# Patient Record
Sex: Female | Born: 1990 | Race: Black or African American | Hispanic: No | Marital: Single | Smoking: Never smoker
Health system: Southern US, Community
[De-identification: ages and names within clinical notes are randomized; demographics above are authoritative.]

## PROBLEM LIST (undated history)

## (undated) ENCOUNTER — Inpatient Hospital Stay (HOSPITAL_COMMUNITY): Payer: Self-pay

## (undated) DIAGNOSIS — E785 Hyperlipidemia, unspecified: Secondary | ICD-10-CM

## (undated) DIAGNOSIS — E1110 Type 2 diabetes mellitus with ketoacidosis without coma: Secondary | ICD-10-CM

## (undated) DIAGNOSIS — I10 Essential (primary) hypertension: Secondary | ICD-10-CM

## (undated) DIAGNOSIS — E119 Type 2 diabetes mellitus without complications: Secondary | ICD-10-CM

## (undated) HISTORY — DX: Essential (primary) hypertension: I10

## (undated) HISTORY — PX: WISDOM TOOTH EXTRACTION: SHX21

## (undated) HISTORY — DX: Hyperlipidemia, unspecified: E78.5

---
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
## 2008-05-30 ENCOUNTER — Ambulatory Visit: Payer: Self-pay | Admitting: Internal Medicine

## 2008-05-30 DIAGNOSIS — R5381 Other malaise: Secondary | ICD-10-CM | POA: Insufficient documentation

## 2008-05-30 DIAGNOSIS — R5383 Other fatigue: Secondary | ICD-10-CM

## 2008-05-31 LAB — CONVERTED CEMR LAB
ALT: 19 units/L (ref 0–35)
AST: 17 units/L (ref 0–37)
Albumin: 4.1 g/dL (ref 3.5–5.2)
Alkaline Phosphatase: 43 units/L (ref 39–117)
BUN: 8 mg/dL (ref 6–23)
Basophils Absolute: 0 10*3/uL (ref 0.0–0.1)
Basophils Relative: 0.2 % (ref 0.0–1.0)
Bilirubin, Direct: 0.1 mg/dL (ref 0.0–0.3)
CO2: 27 meq/L (ref 19–32)
Calcium: 9.5 mg/dL (ref 8.4–10.5)
Chloride: 105 meq/L (ref 96–112)
Creatinine, Ser: 0.8 mg/dL (ref 0.4–1.2)
Eosinophils Absolute: 0 10*3/uL (ref 0.0–0.7)
Eosinophils Relative: 1.1 % (ref 0.0–5.0)
Folate: 6.4 ng/mL
GFR calc Af Amer: 119 mL/min
GFR calc non Af Amer: 98 mL/min
Glucose, Bld: 107 mg/dL — ABNORMAL HIGH (ref 70–99)
HCT: 30.7 % — ABNORMAL LOW (ref 36.0–46.0)
Hemoglobin: 10.3 g/dL — ABNORMAL LOW (ref 12.0–15.0)
Iron: 33 ug/dL — ABNORMAL LOW (ref 42–145)
Lymphocytes Relative: 42.7 % (ref 12.0–46.0)
MCHC: 33.6 g/dL (ref 30.0–36.0)
MCV: 77.3 fL — ABNORMAL LOW (ref 78.0–100.0)
Monocytes Absolute: 0.4 10*3/uL (ref 0.1–1.0)
Monocytes Relative: 8.3 % (ref 3.0–12.0)
Neutro Abs: 2.1 10*3/uL (ref 1.4–7.7)
Neutrophils Relative %: 47.7 % (ref 43.0–77.0)
Platelets: 249 10*3/uL (ref 150–400)
Potassium: 3.5 meq/L (ref 3.5–5.1)
RBC: 3.97 M/uL (ref 3.87–5.11)
RDW: 15.2 % — ABNORMAL HIGH (ref 11.5–14.6)
Sodium: 140 meq/L (ref 135–145)
TSH: 0.62 microintl units/mL (ref 0.35–5.50)
Total Bilirubin: 0.6 mg/dL (ref 0.3–1.2)
Total Protein: 7.6 g/dL (ref 6.0–8.3)
Vitamin B-12: 305 pg/mL (ref 211–911)
WBC: 4.3 10*3/uL — ABNORMAL LOW (ref 4.5–10.5)

## 2008-08-24 ENCOUNTER — Ambulatory Visit: Payer: Self-pay | Admitting: Internal Medicine

## 2010-01-28 LAB — CONVERTED CEMR LAB: Pap Smear: NORMAL

## 2010-03-18 ENCOUNTER — Emergency Department (HOSPITAL_COMMUNITY): Admission: EM | Admit: 2010-03-18 | Discharge: 2010-03-19 | Payer: Self-pay | Admitting: Emergency Medicine

## 2010-06-05 ENCOUNTER — Emergency Department (HOSPITAL_BASED_OUTPATIENT_CLINIC_OR_DEPARTMENT_OTHER): Admission: EM | Admit: 2010-06-05 | Discharge: 2010-06-05 | Payer: Self-pay | Admitting: Emergency Medicine

## 2010-07-03 ENCOUNTER — Ambulatory Visit: Payer: Self-pay | Admitting: Internal Medicine

## 2010-07-03 LAB — CONVERTED CEMR LAB
BUN: 9 mg/dL (ref 6–23)
CO2: 23 meq/L (ref 19–32)
Calcium: 9.8 mg/dL (ref 8.4–10.5)
Chloride: 105 meq/L (ref 96–112)
Cholesterol: 153 mg/dL (ref 0–200)
Creatinine, Ser: 0.83 mg/dL (ref 0.40–1.20)
Glucose, Bld: 94 mg/dL (ref 70–99)
HCT: 36.6 % (ref 36.0–46.0)
HDL: 36 mg/dL — ABNORMAL LOW (ref >39)
Hemoglobin: 11.1 g/dL — ABNORMAL LOW (ref 12.0–15.0)
LDL Cholesterol: 106 mg/dL — ABNORMAL HIGH (ref 0–99)
MCHC: 30.3 g/dL (ref 30.0–36.0)
MCV: 86.9 fL (ref 78.0–100.0)
Platelets: 331 10*3/uL (ref 150–400)
Potassium: 4.7 meq/L (ref 3.5–5.3)
RBC: 4.21 M/uL (ref 3.87–5.11)
RDW: 14.6 % (ref 11.5–15.5)
Sodium: 137 meq/L (ref 135–145)
Total CHOL/HDL Ratio: 4.3
Triglycerides: 53 mg/dL (ref <150)
VLDL: 11 mg/dL (ref 0–40)
WBC: 4.5 10*3/uL (ref 4.0–10.5)

## 2010-07-04 ENCOUNTER — Encounter: Payer: Self-pay | Admitting: Internal Medicine

## 2010-07-04 ENCOUNTER — Telehealth: Payer: Self-pay | Admitting: Internal Medicine

## 2010-07-09 ENCOUNTER — Telehealth: Payer: Self-pay | Admitting: Internal Medicine

## 2010-07-09 ENCOUNTER — Encounter: Payer: Self-pay | Admitting: Internal Medicine

## 2010-08-06 ENCOUNTER — Ambulatory Visit: Payer: Self-pay | Admitting: Diagnostic Radiology

## 2010-08-06 ENCOUNTER — Ambulatory Visit (HOSPITAL_BASED_OUTPATIENT_CLINIC_OR_DEPARTMENT_OTHER): Admission: RE | Admit: 2010-08-06 | Discharge: 2010-08-06 | Payer: Self-pay | Admitting: Internal Medicine

## 2010-08-06 ENCOUNTER — Ambulatory Visit: Payer: Self-pay | Admitting: Internal Medicine

## 2010-08-06 DIAGNOSIS — D649 Anemia, unspecified: Secondary | ICD-10-CM | POA: Insufficient documentation

## 2010-08-06 DIAGNOSIS — R0602 Shortness of breath: Secondary | ICD-10-CM | POA: Insufficient documentation

## 2010-08-06 DIAGNOSIS — R350 Frequency of micturition: Secondary | ICD-10-CM | POA: Insufficient documentation

## 2010-08-08 ENCOUNTER — Telehealth: Payer: Self-pay | Admitting: Internal Medicine

## 2010-08-12 ENCOUNTER — Ambulatory Visit (HOSPITAL_COMMUNITY): Admission: RE | Admit: 2010-08-12 | Discharge: 2010-08-12 | Payer: Self-pay | Admitting: Internal Medicine

## 2010-08-14 ENCOUNTER — Ambulatory Visit: Payer: Self-pay | Admitting: Internal Medicine

## 2010-09-07 ENCOUNTER — Emergency Department (HOSPITAL_BASED_OUTPATIENT_CLINIC_OR_DEPARTMENT_OTHER): Admission: EM | Admit: 2010-09-07 | Discharge: 2010-09-07 | Payer: Self-pay | Admitting: Emergency Medicine

## 2010-12-15 LAB — CONVERTED CEMR LAB
Bilirubin Urine: NEGATIVE
Ferritin: 20 ng/mL (ref 10–291)
Glucose, Urine, Semiquant: NEGATIVE
Hgb S Quant: 0 % (ref 0.0–0.0)
Iron: 38 ug/dL — ABNORMAL LOW (ref 42–145)
Ketones, urine, test strip: NEGATIVE
MCHC: 30.9 g/dL (ref 30.0–36.0)
MCV: 85.7 fL (ref 78.0–100.0)
Nitrite: NEGATIVE
Platelets: 295 10*3/uL (ref 150–400)
Protein, U semiquant: 30
RBC: 3.85 M/uL — ABNORMAL LOW (ref 3.87–5.11)
Specific Gravity, Urine: 1.025
UIBC: 360 ug/dL
Urobilinogen, UA: 0.2
WBC Urine, dipstick: NEGATIVE
pH: 5

## 2010-12-17 NOTE — Assessment & Plan Note (Signed)
Summary: SOB while exercising/dt   Vital Signs:  Patient profile:   20 year old female Menstrual status:  regular Height:      65.5 inches Weight:      168 pounds O2 Sat:      100 % on Room air Pulse rate:   62 / minute BP sitting:   110 / 76  (left arm) Cuff size:   regular  Vitals Entered By: Kathlene November (August 06, 2010 8:44 AM)  O2 Flow:  Room air CC: sob when running   Primary Care Provider:  D. Thomos Lemons DO  CC:  sob when running.  History of Present Illness: 20 y/o AA female c/o dyspnea 2 weeks ago - pt was running she went over a hill.  she felt short of breath.  some wheezing it was not particularly cold no prev hx of wheezing or asthma no prev hx of exercise intolerance no chest pain with exertion no hx of syncope she has hx of anemia - presumed iron def  she also c/o mild urinary freq, urgency and dysuria  Current Medications (verified): 1)  Vitamin D3 1000 Unit  Tabs (Cholecalciferol) .Marland Kitchen.. 1 By Mouth Daily  Allergies (verified): No Known Drug Allergies  Comments:  Nurse/Medical Assistant: The patient's medications and allergies were reviewed with the patient and were updated in the Medication and Allergy Lists. Kathlene November (August 06, 2010 8:45 AM)  Past History:  Past Medical History: Anemia-iron deficiency      Family History: Family History Diabetes 1st degree relative  Family History Hypertension  Mother and father - age 37 both parents have htn DM II - mother (insulin) mother had CVA - late 47's maternal aunts - breast ca colon ca - no      Social History: Occupation: Retail banker. - pharmacy tech Single - lives with parents Never Smoked   no alcohol  Physical Exam  General:  alert, well-developed, and well-nourished.   Eyes:  pink conjunctiva Ears:  R ear normal and L ear normal.   Mouth:  pharynx pink and moist.   Lungs:  normal respiratory effort, normal breath sounds, no crackles, and no wheezes.   Heart:   normal rate, regular rhythm, no murmur, and no gallop.     Impression & Recommendations:  Problem # 1:  SHORTNESS OF BREATH (ICD-786.05) 20 y/o AA female with complaints of dyspnea.  question anemia vs ashtma. check labs.  arrange PFTs Orders: T-2 View CXR, Same Day (71020.5TC) EKG w/ Interpretation (93000) T-CBC No Diff ([id number]) Pulmonary Referral (Pulmonary)  Problem # 2:  ANEMIA-IRON DEFICIENCY (ICD-280.9)  Orders: T-Iron ([id number]) T-Iron Binding Capacity (TIBC) ([id number]) T-Ferritin ([id number])  Problem # 3:  FREQUENCY, URINARY (ICD-788.41) UA negative.  increase fluids.  Patient advised to call office if symptoms persist or worsen.  Orders: UA Dipstick w/o Micro (manual) (64403)  Complete Medication List: 1)  Vitamin D3 1000 Unit Tabs (Cholecalciferol) .Marland Kitchen.. 1 by mouth daily  Other Orders: T- * Misc. Laboratory test [phone])  Patient Instructions: 1)  Please schedule a follow-up appointment in 2 weeks. 2)  Increase fluid intake ( cranberry juice )  Laboratory Results   Urine Tests  Date/Time Received: 08/06/2010 Date/Time Reported: 08/06/2010  Routine Urinalysis   Color: red Appearance: Clear Glucose: negative   (Normal Range: Negative) Bilirubin: negative   (Normal Range: Negative) Ketone: negative   (Normal Range: Negative) Spec. Gravity: 1.025   (Normal Range: 1.003-1.035) Blood: large   (Normal Range: Negative) pH: 5.0   (Normal  Range: 5.0-8.0) Protein: 30   (Normal Range: Negative) Urobilinogen: 0.2   (Normal Range: 0-1) Nitrite: negative   (Normal Range: Negative) Leukocyte Esterace: negative   (Normal Range: Negative)    Comments: Patient is on her menstrual cycle

## 2010-12-17 NOTE — Progress Notes (Signed)
Summary: Lab Results  Phone Note Outgoing Call   Summary of Call: call pt - HIV antibody and RPR is negative I will send letter re:  other lab results pt has mild anemia.  I suggest she take MVI with iron daily Initial call taken by: D. Thomos Lemons DO,  July 04, 2010 10:26 AM  Follow-up for Phone Call        call placed to patient at [phone]. She has been  advised per Dr Artist Pais instructions. Follow-up by: Glendell Docker CMA,  July 05, 2010 12:10 PM

## 2010-12-17 NOTE — Letter (Signed)
Summary: [employer] Form/[employer]  [employer] Form[employer]   Imported By: Lanelle Bal 07/19/2010 09:00:17  _____________________________________________________________________  External Attachment:    Type:   Image     Comment:   External Document

## 2010-12-17 NOTE — Progress Notes (Signed)
Summary: Lab Work  Programme researcher, broadcasting/film/video of Call: call pt - blood work shows iron def anemia.   I advise pt take OTC iron tabs (slow Fe) once daily. blood tests negative for sickle cell.   schedule repeat CBC in 2 months Initial call taken by: D. Thomos Lemons DO,  August 08, 2010 5:36 PM  Follow-up for Phone Call        call placed to patient at [phone], she has been advised per Dr Artist Pais instructions. Lab work entered for November in [facility] per patient request Follow-up by: Glendell Docker CMA,  August 09, 2010 12:45 PM

## 2010-12-17 NOTE — Assessment & Plan Note (Signed)
Summary: NEW PT CPX WANTS HIV TEST/DT   Vital Signs:  Patient profile:   20 year old female Menstrual status:  regular LMP:     06/15/2010 Height:      65.5 inches Weight:      166.25 pounds BMI:     27.34 O2 Sat:      100 % on Room air Pulse rate:   75 / minute Pulse rhythm:   regular Resp:     16 per minute BP sitting:   106 / 70  (left arm) Cuff size:   regular  Vitals Entered By: Glendell Docker CMA (July 03, 2010 9:53 AM)  O2 Flow:  Room air CC: CPX Is Patient Diabetic? No Pain Assessment Patient in pain? no       Does patient need assistance? Functional Status Self care Ambulation Normal Comments Cpx,requesting std testing LMP (date): 06/15/2010     Menstrual Status regular Enter LMP: 06/15/2010 Last PAP Result normal   Primary Care Provider:  Dondra Spry DO  CC:  CPX.  History of Present Illness: 20 y/o AA female for routine cpx Dr. Dareen Piano - GYN last PAP March 2011 prev HPV positive  gonorrhea - tx'ed last yr 2010 chlamydia  - tx'ed this yr  not currently sexually active  she denies GU symptoms     Preventive Screening-Counseling & Management  Alcohol-Tobacco     Alcohol drinks/day: 0     Smoking Status: never  Caffeine-Diet-Exercise     Caffeine use/day: 1 beverage daily     Diet Counseling: to improve diet; diet is suboptimal     Does Patient Exercise: yes     Times/week: 3  Hep-HIV-STD-Contraception     STD Risk Counseling: to avoid increased STD risk      Sexual History:  currently monogamous.        Drug Use:  never.    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         no night sweats or fevers  Physical Exam  General:  alert, well-developed, and well-nourished.   Head:  normocephalic and atraumatic.   Eyes:  vision grossly intact, pupils equal, pupils round, and pupils reactive to light.   Ears:  R ear normal and L ear normal.   Mouth:  good dentition and pharynx pink and moist.   Neck:  No deformities, masses, or tenderness noted. Lungs:  normal respiratory effort, normal breath sounds, no crackles, and no wheezes.   Heart:  normal rate and regular rhythm.  faint SEM I/VI LSB not accentuated by left hand grip Abdomen:  soft, non-tender, normal bowel sounds, no masses, no hepatomegaly, and no splenomegaly.   Extremities:  No lower extremity edema Neurologic:  cranial nerves II-XII intact and gait normal.  Psych:  normally interactive, good eye contact, not anxious appearing, and not depressed appearing.     Impression & Recommendations:  Problem # 1:  WELL ADULT EXAM (ICD-V70.0) Reviewed adult health maintenance protocols. safe sex practices reviewed.  we discussed limitations of HIV antibody testing  Orders: T-Basic Metabolic Panel [phone]) T-Lipid Profile [phone]) T-HIV Antibody  (Reflex) [phone]) T-Syphilis Test (RPR) [phone])  Pap smear: normal (01/28/2010) Td Booster: Tdap (07/03/2010)   Flu Vax: Fluvax 3+ (08/24/2008)   TSH: 0.62 (05/30/2008)     Complete Medication List: 1)  Vitamin D3 1000 Unit Tabs (Cholecalciferol) .Marland Kitchen.. 1 by mouth daily  Other Orders: Tdap => 44yrs IM (24401) Admin 1st Vaccine (02725) T-CBC No  Diff ([id number])  Patient Instructions: 1)  Please schedule a follow-up appointment as needed.   Preventive Care Screening  Pap Smear:    Date:  01/28/2010    Results:  normal     Immunizations Administered:  Tetanus Vaccine:    Vaccine Type: Tdap    Site: left deltoid    Mfr: GlaxoSmithKline    Dose: 0.5 ml    Route: IM    Given by: Glendell Docker CMA    Exp. Date: 02/08/2012    Lot #: [id number]    VIS given: 10/05/07 version given July 03, 2010.   Prevention & Chronic Care Immunizations   Influenza vaccine: Fluvax 3+  (08/24/2008)    Tetanus booster: 07/03/2010: Tdap    Pneumococcal vaccine: Not documented  Other Screening   Pap smear: normal  (01/28/2010)   Smoking status: never  (07/03/2010)

## 2010-12-17 NOTE — Letter (Signed)
   [place] [facility] [address] [place], [place]  [address]  [place] Phone: [phone]      July 04, 2010   [facility] [address] [place], [place] [address]  RE:  LAB RESULTS  Dear  Ms. WOLTERS,  The following is an interpretation of your most recent lab tests.  Please take note of any instructions provided or changes to medications that have resulted from your lab work.  ELECTROLYTES:  Good - no changes needed  KIDNEY FUNCTION TESTS:  Good - no changes needed  LIPID PANEL:  Fair - review at your next visit Triglyceride: 53   Cholesterol: 153   LDL: 106   HDL: 36   Chol/HDL%:  4.3 Ratio   CBC:  Fair - review at your next visit          Sincerely Yours,    Dr. Thomos Lemons

## 2010-12-17 NOTE — Miscellaneous (Signed)
Summary: Orders Update pft charges  Clinical Lists Changes  Orders: Added new Service order of Carbon Monoxide diffusing w/capacity (94720) - Signed Added new Service order of Lung Volumes (94240) - Signed Added new Service order of Spirometry (Pre & Post) (94060) - Signed 

## 2010-12-17 NOTE — Progress Notes (Signed)
Summary: ROTC form  Phone Note Call from Patient Call back at [phone]   Caller: Mom Reason for Call: Talk to Nurse Summary of Call: Pls contact pt's mother when [facility] form is ready Initial call taken by: Lannette Donath,  July 09, 2010 9:59 AM  Follow-up for Phone Call        call placed to patient she was advised form faxed to phone number provided Follow-up by: Glendell Docker CMA,  July 09, 2010 12:39 PM

## 2011-02-01 LAB — WET PREP, GENITAL: Trich, Wet Prep: NONE SEEN

## 2011-02-01 LAB — URINALYSIS, ROUTINE W REFLEX MICROSCOPIC
Glucose, UA: NEGATIVE mg/dL
Nitrite: NEGATIVE
Protein, ur: NEGATIVE mg/dL
Urobilinogen, UA: 0.2 mg/dL (ref 0.0–1.0)

## 2011-02-01 LAB — PREGNANCY, URINE: Preg Test, Ur: NEGATIVE

## 2011-02-01 LAB — URINE MICROSCOPIC-ADD ON

## 2011-02-04 LAB — GC/CHLAMYDIA PROBE AMP, GENITAL
Chlamydia, DNA Probe: POSITIVE — AB
GC Probe Amp, Genital: NEGATIVE

## 2011-02-04 LAB — URINE MICROSCOPIC-ADD ON

## 2011-02-04 LAB — URINALYSIS, ROUTINE W REFLEX MICROSCOPIC
Bilirubin Urine: NEGATIVE
Glucose, UA: NEGATIVE mg/dL
Hgb urine dipstick: NEGATIVE
Ketones, ur: 40 mg/dL — AB
Nitrite: NEGATIVE
Specific Gravity, Urine: 1.02 (ref 1.005–1.030)
pH: 6 (ref 5.0–8.0)

## 2011-02-04 LAB — POCT PREGNANCY, URINE: Preg Test, Ur: NEGATIVE

## 2014-08-31 ENCOUNTER — Emergency Department (HOSPITAL_COMMUNITY)
Admission: EM | Admit: 2014-08-31 | Discharge: 2014-08-31 | Disposition: A | Discharge status: Home / Self Care | Payer: 59 | Attending: Emergency Medicine | Admitting: Emergency Medicine

## 2014-08-31 ENCOUNTER — Encounter (HOSPITAL_COMMUNITY): Payer: Self-pay | Admitting: Emergency Medicine

## 2014-08-31 DIAGNOSIS — R350 Frequency of micturition: Secondary | ICD-10-CM | POA: Diagnosis not present

## 2014-08-31 DIAGNOSIS — R109 Unspecified abdominal pain: Secondary | ICD-10-CM | POA: Diagnosis present

## 2014-08-31 DIAGNOSIS — R34 Anuria and oliguria: Secondary | ICD-10-CM | POA: Insufficient documentation

## 2014-08-31 DIAGNOSIS — Z3202 Encounter for pregnancy test, result negative: Secondary | ICD-10-CM | POA: Diagnosis not present

## 2014-08-31 LAB — COMPREHENSIVE METABOLIC PANEL
ALBUMIN: 4 g/dL (ref 3.5–5.2)
ALK PHOS: 50 U/L (ref 39–117)
ALT: 36 U/L — ABNORMAL HIGH (ref 0–35)
ANION GAP: 15 (ref 5–15)
AST: 23 U/L (ref 0–37)
BUN: 10 mg/dL (ref 6–23)
CALCIUM: 9.5 mg/dL (ref 8.4–10.5)
CO2: 22 mEq/L (ref 19–32)
Chloride: 99 mEq/L (ref 96–112)
Creatinine, Ser: 0.89 mg/dL (ref 0.50–1.10)
GFR calc Af Amer: >90 mL/min (ref >90)
GFR calc non Af Amer: 89 mL/min — ABNORMAL LOW (ref >90)
Glucose, Bld: 146 mg/dL — ABNORMAL HIGH (ref 70–99)
POTASSIUM: 4 meq/L (ref 3.7–5.3)
Sodium: 136 mEq/L — ABNORMAL LOW (ref 137–147)
TOTAL PROTEIN: 8.1 g/dL (ref 6.0–8.3)
Total Bilirubin: <0.2 mg/dL — ABNORMAL LOW (ref 0.3–1.2)

## 2014-08-31 LAB — URINALYSIS, ROUTINE W REFLEX MICROSCOPIC
BILIRUBIN URINE: NEGATIVE
Glucose, UA: NEGATIVE mg/dL
Hgb urine dipstick: NEGATIVE
Ketones, ur: NEGATIVE mg/dL
Leukocytes, UA: NEGATIVE
NITRITE: NEGATIVE
Protein, ur: NEGATIVE mg/dL
SPECIFIC GRAVITY, URINE: 1.018 (ref 1.005–1.030)
UROBILINOGEN UA: 0.2 mg/dL (ref 0.0–1.0)
pH: 5.5 (ref 5.0–8.0)

## 2014-08-31 LAB — CBC WITH DIFFERENTIAL/PLATELET
BASOS ABS: 0 10*3/uL (ref 0.0–0.1)
BASOS PCT: 0 % (ref 0–1)
Eosinophils Absolute: 0.1 10*3/uL (ref 0.0–0.7)
Eosinophils Relative: 1 % (ref 0–5)
HCT: 35.2 % — ABNORMAL LOW (ref 36.0–46.0)
Hemoglobin: 11.3 g/dL — ABNORMAL LOW (ref 12.0–15.0)
Lymphocytes Relative: 37 % (ref 12–46)
Lymphs Abs: 2.5 10*3/uL (ref 0.7–4.0)
MCH: 27.5 pg (ref 26.0–34.0)
MCHC: 32.1 g/dL (ref 30.0–36.0)
MCV: 85.6 fL (ref 78.0–100.0)
MONO ABS: 0.5 10*3/uL (ref 0.1–1.0)
Monocytes Relative: 8 % (ref 3–12)
NEUTROS ABS: 3.6 10*3/uL (ref 1.7–7.7)
NEUTROS PCT: 54 % (ref 43–77)
Platelets: 251 10*3/uL (ref 150–400)
RBC: 4.11 MIL/uL (ref 3.87–5.11)
RDW: 13.4 % (ref 11.5–15.5)
WBC: 6.7 10*3/uL (ref 4.0–10.5)

## 2014-08-31 LAB — POC URINE PREG, ED: PREG TEST UR: NEGATIVE

## 2014-08-31 MED ORDER — KETOROLAC TROMETHAMINE 30 MG/ML IJ SOLN
30.0000 mg | Freq: Once | INTRAMUSCULAR | Status: AC
  Administered 2014-08-31: 30 mg via INTRAMUSCULAR
  Filled 2014-08-31: qty 1

## 2014-08-31 MED ORDER — PHENAZOPYRIDINE HCL 95 MG PO TABS: 95.0000 mg | ORAL_TABLET | Freq: Three times a day (TID) | ORAL | Status: DC | PRN

## 2014-08-31 NOTE — ED Provider Notes (Signed)
CSN: [id number]     Arrival date & time 08/31/14  0205 History   First MD Initiated Contact with Patient 08/31/14 0232     Chief Complaint  Patient presents with  . Abdominal Pain     (Consider location/radiation/quality/duration/timing/severity/associated sxs/prior Treatment) HPI Comments: Patient is an otherwise healthy 23 year old female presenting to the emergency department for one day history of urinary frequency and urgency with decreased urine output. She states she is having some lower abdominal discomfort associated with that. No alleviating or aggravating factors. She's not tried any medications. She endorses a she's had one episode of nonbloody loose stools. She denies any fevers, chills, vomiting, dysuria, back or flank pain, vaginal bleeding or discharge. Patient is adamant she is concerned about a sexually transmitted disease. She has no history of pregnancies or abdominal surgical history. Last menstrual period was one month ago.   History reviewed. No pertinent past medical history. History reviewed. No pertinent past surgical history. No family history on file. History  Substance Use Topics  . Smoking status: Never Smoker   . Smokeless tobacco: Not on file  . Alcohol Use: Yes     Comment: weekends   OB History   Grav Para Term Preterm Abortions TAB SAB Ect Mult Living                 Review of Systems  Gastrointestinal: Positive for nausea and abdominal pain.  Genitourinary: Positive for urgency, frequency and decreased urine volume. Negative for dysuria, flank pain, vaginal bleeding, vaginal discharge and vaginal pain.  All other systems reviewed and are negative.     Allergies  Review of patient's allergies indicates no known allergies.  Home Medications   Prior to Admission medications   Medication Sig Start Date End Date Taking? Authorizing Provider  levonorgestrel (MIRENA) 20 MCG/24HR IUD 1 each by Intrauterine route once.   Yes Historical Provider,  MD  phenazopyridine (PYRIDIUM) 95 MG tablet Take 1 tablet (95 mg total) by mouth 3 (three) times daily as needed for pain. 08/31/14   Celena Lanius L Jewel Mcafee, PA-C   BP 112/59  Pulse 56  Temp(Src) 98.2 F (36.8 C) (Oral)  Resp 16  SpO2 100% Physical Exam  Nursing note and vitals reviewed. Constitutional: She is oriented to person, place, and time. She appears well-developed and well-nourished. No distress.  HENT:  Head: Normocephalic and atraumatic.  Right Ear: External ear normal.  Left Ear: External ear normal.  Nose: Nose normal.  Mouth/Throat: Oropharynx is clear and moist.  Eyes: Conjunctivae are normal.  Neck: Normal range of motion. Neck supple.  Cardiovascular: Normal rate, regular rhythm and normal heart sounds.   Pulmonary/Chest: Effort normal and breath sounds normal.  Abdominal: Soft. Normal appearance and bowel sounds are normal. She exhibits no distension. There is tenderness (mild lower abdominal discomfort). There is no rigidity, no rebound, no guarding and no CVA tenderness.  Genitourinary:  Pelvic deferred.   Musculoskeletal: Normal range of motion.  Neurological: She is alert and oriented to person, place, and time.  Skin: Skin is warm and dry. She is not diaphoretic.  Psychiatric: She has a normal mood and affect.    ED Course  Procedures (including critical care time) Medications  ketorolac (TORADOL) 30 MG/ML injection 30 mg (30 mg Intramuscular Given 08/31/14 0327)    Labs Review Labs Reviewed  CBC WITH DIFFERENTIAL - Abnormal; Notable for the following:    Hemoglobin 11.3 (*)    HCT 35.2 (*)    All other components within  normal limits  COMPREHENSIVE METABOLIC PANEL - Abnormal; Notable for the following:    Sodium 136 (*)    Glucose, Bld 146 (*)    ALT 36 (*)    Total Bilirubin <0.2 (*)    GFR calc non Af Amer 89 (*)    All other components within normal limits  URINE CULTURE  URINALYSIS, ROUTINE W REFLEX MICROSCOPIC  POC URINE PREG, ED  POC  URINE PREG, ED    Imaging Review No results found.   EKG Interpretation None      MDM   Final diagnoses:  Urinary frequency    Filed Vitals:   08/31/14 0445  BP: 112/59  Pulse: 56  Temp:   Resp:    Afebrile, NAD, non-toxic appearing, AAOx4.   I have reviewed nursing notes, vital signs, and all appropriate lab and imaging results for this patient. On reevaluation of patient, her abdominal exam is benign. There is no peritoneal signs. Patient has no CVA tenderness. Mouth revealed without acute abnormality. UA without evidence of infection. Urine culture sent. Patient declined pelvic exam, discussed with patient that this could possibly be causing her symptoms. She is willing to follow up with her PCP for any continued symptoms. Return precautions discussed. Patient is agreeable to plan. Patient is stable at time of discharge.  Jeannetta EllisJennifer L Colon Rueth, PA-C 08/31/14 2026

## 2014-08-31 NOTE — Discharge Instructions (Signed)
Please follow up with your primary care physician in 1-2 days. If you do not have one please call the [facility] number listed above. Please read all discharge instructions and return precautions.   Urinary Frequency The number of times a normal person urinates depends upon how much liquid they take in and how much liquid they are losing. If the temperature is hot and there is high humidity, then the person will sweat more and usually breathe a little more frequently. These factors decrease the amount of frequency of urination that would be considered normal. The amount you drink is easily determined, but the amount of fluid lost is sometimes more difficult to calculate.  Fluid is lost in two ways:  Sensible fluid loss is usually measured by the amount of urine that you get rid of. Losses of fluid can also occur with diarrhea.  Insensible fluid loss is more difficult to measure. It is caused by evaporation. Insensible loss of fluid occurs through breathing and sweating. It usually ranges from a little less than a quart to a little more than a quart of fluid a day. In normal temperatures and activity levels, the average person may urinate 4 to 7 times in a 24-hour period. Needing to urinate more often than that could indicate a problem. If one urinates 4 to 7 times in 24 hours and has large volumes each time, that could indicate a different problem from one who urinates 4 to 7 times a day and has small volumes. The time of urinating is also important. Most urinating should be done during the waking hours. Getting up at night to urinate frequently can indicate some problems. CAUSES  The bladder is the organ in your lower abdomen that holds urine. Like a balloon, it swells some as it fills up. Your nerves sense this and tell you it is time to head for the bathroom. There are a number of reasons that you might feel the need to urinate more often than usual. They include:  Urinary tract  infection. This is usually associated with other signs such as burning when you urinate.  In men, problems with the prostate (a walnut-size gland that is located near the tube that carries urine out of your body). There are two reasons why the prostate can cause an increased frequency of urination:  An enlarged prostate that does not let the bladder empty well. If the bladder only half empties when you urinate, then it only has half the capacity to fill before you have to urinate again.  The nerves in the bladder become more hypersensitive with an increased size of the prostate even if the bladder empties completely.  Pregnancy.  Obesity. Excess weight is more likely to cause a problem for women than for men.  Bladder stones or other bladder problems.  Caffeine.  Alcohol.  Medications. For example, drugs that help the body get rid of extra fluid (diuretics) increase urine production. Some other medicines must be taken with lots of fluids.  Muscle or nerve weakness. This might be the result of a spinal cord injury, a stroke, multiple sclerosis, or Parkinson disease.  Long-standing diabetes can decrease the sensation of the bladder. This loss of sensation makes it harder to sense the bladder needs to be emptied. Over a period of years, the bladder is stretched out by constant overfilling. This weakens the bladder muscles so that the bladder does not empty well and has less capacity to fill with new urine.  Interstitial cystitis (also called painful bladder syndrome). This condition develops because the tissues that line the inside of the bladder are inflamed (inflammation is the body's way of reacting to injury or infection). It causes pain and frequent urination. It occurs in women more often than in men. DIAGNOSIS   To decide what might be causing your urinary frequency, your health care provider will probably:  Ask about symptoms you have noticed.  Ask about your overall health. This  will include questions about any medications you are taking.  Do a physical examination.  Order some tests. These might include:  A blood test to check for diabetes or other health issues that could be contributing to the problem.  Urine testing. This could measure the flow of urine and the pressure on the bladder.  A test of your neurological system (the brain, spinal cord, and nerves). This is the system that senses the need to urinate.  A bladder test to check whether it is emptying completely when you urinate.  Cystoscopy. This test uses a thin tube with a tiny camera on it. It offers a look inside your urethra and bladder to see if there are problems.  Imaging tests. You might be given a contrast dye and then asked to urinate. X-rays are taken to see how your bladder is working. TREATMENT  It is important for you to be evaluated to determine if the amount or frequency that you have is unusual or abnormal. If it is found to be abnormal, the cause should be determined and this can usually be found out easily. Depending upon the cause, treatment could include medication, stimulation of the nerves, or surgery. There are not too many things that you can do as an individual to change your urinary frequency. It is important that you balance the amount of fluid intake needed to compensate for your activity and the temperature. Medical problems will be diagnosed and taken care of by your physician. There is no particular bladder training such as Kegel exercises that you can do to help urinary frequency. This is an exercise that is usually recommended for people who have leaking of urine when they laugh, cough, or sneeze. HOME CARE INSTRUCTIONS   Take any medications your health care provider prescribed or suggested. Follow the directions carefully.  Practice any lifestyle changes that are recommended. These might include:  Drinking less fluid or drinking at different times of the day. If you  need to urinate often during the night, for example, you may need to stop drinking fluids early in the evening.  Cutting down on caffeine or alcohol. They both can make you need to urinate more often than normal. Caffeine is found in coffee, tea, and sodas.  Losing weight, if that is recommended.  Keep a journal or a log. You might be asked to record how much you drink and when and where you feel the need to urinate. This will also help evaluate how well the treatment provided by your physician is working. SEEK MEDICAL CARE IF:   Your need to urinate often gets worse.  You feel increased pain or irritation when you urinate.  You notice blood in your urine.  You have questions about any medications that your health care provider recommended.  You notice blood, pus, or swelling at the site of any test or treatment procedure.  You develop a fever of more than 100.51F (38.1C). SEEK IMMEDIATE MEDICAL CARE IF:  You develop a fever of more than 102.6F (38.9C).  Document Released: 08/30/2009 Document Revised: 03/20/2014 Document Reviewed: 08/30/2009 [facility] Patient Information 2015 [place], [place]. This information is not intended to replace advice given to you by your health care provider. Make sure you discuss any questions you have with your health care provider.

## 2014-08-31 NOTE — ED Notes (Signed)
Pt. States that around 0800 she started having abdominal pain. She stated she noticed she was trying to pee too many times throughout the day and not much coming out. Denies vomiting, fever or chills. States she has had some nausea.

## 2014-08-31 NOTE — ED Notes (Signed)
Family at bedside. 

## 2014-09-01 LAB — URINE CULTURE
COLONY COUNT: NO GROWTH
CULTURE: NO GROWTH

## 2014-09-01 NOTE — ED Provider Notes (Signed)
Medical screening examination/treatment/procedure(s) were performed by non-physician practitioner and as supervising physician I was immediately available for consultation/collaboration.   EKG Interpretation None        Tomasita CrumbleAdeleke Ellary Casamento, MD 09/01/14 1009

## 2014-12-16 ENCOUNTER — Encounter (HOSPITAL_COMMUNITY): Payer: Self-pay | Admitting: Emergency Medicine

## 2014-12-16 ENCOUNTER — Emergency Department (HOSPITAL_COMMUNITY)
Admission: EM | Admit: 2014-12-16 | Discharge: 2014-12-16 | Disposition: A | Discharge status: Home / Self Care | Payer: 59 | Attending: Emergency Medicine | Admitting: Emergency Medicine

## 2014-12-16 DIAGNOSIS — M545 Low back pain: Secondary | ICD-10-CM | POA: Insufficient documentation

## 2014-12-16 DIAGNOSIS — M546 Pain in thoracic spine: Secondary | ICD-10-CM | POA: Insufficient documentation

## 2014-12-16 MED ORDER — IBUPROFEN 400 MG PO TABS
800.0000 mg | ORAL_TABLET | Freq: Once | ORAL | Status: AC
  Administered 2014-12-16: 800 mg via ORAL
  Filled 2014-12-16: qty 2

## 2014-12-16 MED ORDER — HYDROCODONE-ACETAMINOPHEN 5-325 MG PO TABS
1.0000 | ORAL_TABLET | Freq: Once | ORAL | Status: AC
  Administered 2014-12-16: 1 via ORAL
  Filled 2014-12-16: qty 1

## 2014-12-16 MED ORDER — IBUPROFEN 800 MG PO TABS: 800.0000 mg | ORAL_TABLET | Freq: Three times a day (TID) | ORAL | Status: DC

## 2014-12-16 MED ORDER — METHOCARBAMOL 500 MG PO TABS: 1000.0000 mg | ORAL_TABLET | Freq: Four times a day (QID) | ORAL | Status: DC

## 2014-12-16 NOTE — ED Notes (Signed)
Declined W/C at D/C and was escorted to lobby by RN. 

## 2014-12-16 NOTE — ED Notes (Signed)
Pt. Stated, I started having some back pain and it goes around to the side.

## 2014-12-16 NOTE — Discharge Instructions (Signed)
Please read and follow all provided instructions.  Your diagnoses today include:  1. Bilateral thoracic back pain    Tests performed today include:  Vital signs - see below for your results today  Medications prescribed:   Ibuprofen (Motrin, Advil) - anti-inflammatory pain medication  Do not exceed 600mg  ibuprofen every 6 hours, take with food  You have been prescribed an anti-inflammatory medication or NSAID. Take with food. Take smallest effective dose for the shortest duration needed for your pain. Stop taking if you experience stomach pain or vomiting.    Robaxin (methocarbamol) - muscle relaxer medication  DO NOT drive or perform any activities that require you to be awake and alert because this medicine can make you drowsy.   Take any prescribed medications only as directed.  Home care instructions:   Follow any educational materials contained in this packet  Please rest, use ice or heat on your back for the next several days  Do not lift, push, pull anything more than 10 pounds for the next week  Follow-up instructions: Please follow-up with your primary care provider in the next 1 week for further evaluation of your symptoms.   Return instructions:  SEEK IMMEDIATE MEDICAL ATTENTION IF YOU HAVE:  New numbness, tingling, weakness, or problem with the use of your arms or legs  Severe back pain not relieved with medications  Loss control of your bowels or bladder  Increasing pain in any areas of the body (such as chest or abdominal pain)  Shortness of breath, dizziness, or fainting.   Worsening nausea (feeling sick to your stomach), vomiting, fever, or sweats  Any other emergent concerns regarding your health   Additional Information:  Your vital signs today were: BP 144/83 mmHg   Pulse 66   Temp(Src) 98.6 F (37 C) (Oral)   Ht 5\' 6"  (1.676 m)   Wt 220 lb 9 oz (100.046 kg)   BMI 35.62 kg/m2   SpO2 100%   LMP 10/16/2014 (Approximate) If your blood pressure  (BP) was elevated above 135/85 this visit, please have this repeated by your doctor within one month. --------------

## 2014-12-16 NOTE — ED Provider Notes (Signed)
CSN: [id number]     Arrival date & time 12/16/14  0813 History   First MD Initiated Contact with Patient 12/16/14 [phone]     Chief Complaint  Patient presents with  . Back Pain     (Consider location/radiation/quality/duration/timing/severity/associated sxs/prior Treatment) HPI Comments: Patient presents with complaint of bilateral middle back pain starting last evening and gradually worsening. This was not associated with acute injury, however patient does do some bending over and movement of light objects at work. Pain is described as a tightness. It is worse with movement of her arms, deep breathing, twisting of her torso, bending over. It is hard for her to take a deep breath but she does not feel short of breath and has not been gasping for air. She does not get short of breath with walking or other exertion. She has not had cough or hemoptysis. Pain does not radiate. She has not had vaginal bleeding or discharge. No history of kidney stones. No dysuria or hematuria. Patient denies risk factors for pulmonary embolism including: unilateral leg swelling, history of DVT/PE/other blood clots, recent immobilizations, recent surgery, recent travel (>4hr segment), malignancy, hemoptysis. Pt uses a Mirena IUD for birth control. No treatments prior to arrival. Patient denies warning symptoms of back pain including: fecal incontinence, urinary retention or overflow incontinence, night sweats, waking from sleep with back pain, unexplained fevers or weight loss, h/o cancer, IVDU, recent trauma.           Patient is a 24 y.o. female presenting with back pain. The history is provided by the patient.  Back Pain Associated symptoms: no dysuria, no fever, no numbness, no pelvic pain and no weakness     History reviewed. No pertinent past medical history. History reviewed. No pertinent past surgical history. No family history on file. History  Substance Use Topics  . Smoking status: Never Smoker   .  Smokeless tobacco: Not on file  . Alcohol Use: Yes     Comment: weekends   OB History    No data available     Review of Systems  Constitutional: Negative for fever and unexpected weight change.  Respiratory: Negative for shortness of breath.   Gastrointestinal: Negative for constipation.       Negative for fecal incontinence.   Genitourinary: Negative for dysuria, hematuria, flank pain, vaginal bleeding, vaginal discharge and pelvic pain.       Negative for urinary incontinence or retention.  Musculoskeletal: Positive for back pain.  Neurological: Negative for weakness and numbness.       Denies saddle paresthesias.    Allergies  Review of patient's allergies indicates no known allergies.  Home Medications   Prior to Admission medications   Medication Sig Start Date End Date Taking? Authorizing Provider  levonorgestrel (MIRENA) 20 MCG/24HR IUD 1 each by Intrauterine route once.    Historical Provider, MD  phenazopyridine (PYRIDIUM) 95 MG tablet Take 1 tablet (95 mg total) by mouth 3 (three) times daily as needed for pain. 08/31/14   Jennifer L Piepenbrink, PA-C   BP 144/83 mmHg  Pulse 66  Temp(Src) 98.6 F (37 C) (Oral)  Resp 16  Ht  (1.676 m)  Wt 220 lb 9 oz (100.046 kg)  BMI 35.62 kg/m2  SpO2 100%  LMP 10/16/2014 (Approximate)   Physical Exam  Constitutional: She appears well-developed and well-nourished.  HENT:  Head: Normocephalic and atraumatic.  Eyes: Conjunctivae are normal.  Neck: Normal range of motion. Neck supple.  Cardiovascular: Normal rate and regular  rhythm.   No murmur heard. Pulmonary/Chest: Effort normal and breath sounds normal. No respiratory distress. She has no wheezes. She has no rales.  Abdominal: Soft. There is no tenderness. There is no rebound, no guarding and no CVA tenderness.  Musculoskeletal: She exhibits no edema or tenderness.       Right shoulder: Normal.       Left shoulder: Normal.       Cervical back: Normal.        Thoracic back: She exhibits decreased range of motion and pain. She exhibits no tenderness, no bony tenderness, no swelling and no spasm.       Lumbar back: She exhibits decreased range of motion. She exhibits no tenderness and no bony tenderness.       Back:  No step-off noted with palpation of spine.   Neurological: She is alert. She has normal strength and normal reflexes. No sensory deficit.  5/5 strength in entire lower extremities bilaterally. No sensation deficit.   Skin: Skin is warm and dry. No rash noted.  Psychiatric: She has a normal mood and affect.  Nursing note and vitals reviewed.   ED Course  Procedures (including critical care time) Labs Review Labs Reviewed - No data to display  Imaging Review No results found.   EKG Interpretation None      8:46 AM Patient seen and examined. Medications ordered.   Vital signs reviewed and are as follows: BP 144/83 mmHg  Pulse 66  Temp(Src) 98.6 F (37 C) (Oral)  Resp 16  Ht 5\' 6"  (1.676 m)  Wt 220 lb 9 oz (100.046 kg)  BMI 35.62 kg/m2  SpO2 100%  LMP 10/16/2014 (Approximate)  No red flag s/s of low back pain. Patient was counseled on back pain precautions and told to do activity as tolerated but do not lift, push, or pull heavy objects more than 10 pounds for the next week.  Patient counseled to use ice or heat on back for no longer than 15 minutes every hour.   Patient prescribed muscle relaxer and counseled on proper use of muscle relaxant medication.    Urged patient not to drink alcohol, drive, or perform any other activities that requires focus while taking either of these medications.  Patient urged to follow-up with PCP if pain does not improve with treatment and rest or if pain becomes recurrent. Urged to return with worsening severe pain, loss of bowel or bladder control, trouble walking.   The patient verbalizes understanding and agrees with the plan.   MDM   Final diagnoses:  Bilateral thoracic back  pain   Patient with thoracic back pain. This seems musculoskeletal in nature as it is exacerbated by deep breathing, rotation of her torso, and bending over. I do not suspect PE. Patient has no lower abdominal tenderness or flank tenderness suggestive of kidney stone or other intra-abdominal etiology. No neurological deficits. Patient is ambulatory. No warning symptoms of back pain including: fecal incontinence, urinary retention or overflow incontinence, night sweats, waking from sleep with back pain, unexplained fevers or weight loss, h/o cancer, IVDU, recent trauma. No concern for cauda equina, epidural abscess, or other serious cause of back pain. Conservative measures such as rest, ice/heat and pain medicine indicated with PCP follow-up if no improvement with conservative management.     Renne CriglerJoshua Decarlos Empey, PA-C 12/16/14 [id number]  Tilden FossaElizabeth Rees, MD 12/16/14 1006

## 2015-09-28 ENCOUNTER — Emergency Department
Admission: EM | Admit: 2015-09-28 | Discharge: 2015-09-28 | Disposition: A | Discharge status: Home / Self Care | Payer: 59 | Attending: Emergency Medicine | Admitting: Emergency Medicine

## 2015-09-28 ENCOUNTER — Encounter: Payer: Self-pay | Admitting: Emergency Medicine

## 2015-09-28 DIAGNOSIS — E1165 Type 2 diabetes mellitus with hyperglycemia: Secondary | ICD-10-CM | POA: Insufficient documentation

## 2015-09-28 DIAGNOSIS — Z131 Encounter for screening for diabetes mellitus: Secondary | ICD-10-CM

## 2015-09-28 DIAGNOSIS — Z3202 Encounter for pregnancy test, result negative: Secondary | ICD-10-CM | POA: Insufficient documentation

## 2015-09-28 DIAGNOSIS — Z79899 Other long term (current) drug therapy: Secondary | ICD-10-CM | POA: Insufficient documentation

## 2015-09-28 LAB — CBC WITH DIFFERENTIAL/PLATELET
BASOS PCT: 1 %
Basophils Absolute: 0 10*3/uL (ref 0–0.1)
EOS ABS: 0.1 10*3/uL (ref 0–0.7)
Eosinophils Relative: 2 %
HEMATOCRIT: 37.3 % (ref 35.0–47.0)
HEMOGLOBIN: 12.7 g/dL (ref 12.0–16.0)
LYMPHS ABS: 2.4 10*3/uL (ref 1.0–3.6)
Lymphocytes Relative: 39 %
MCH: 28.2 pg (ref 26.0–34.0)
MCHC: 34.1 g/dL (ref 32.0–36.0)
MCV: 82.7 fL (ref 80.0–100.0)
Monocytes Absolute: 0.6 10*3/uL (ref 0.2–0.9)
Monocytes Relative: 10 %
NEUTROS ABS: 2.9 10*3/uL (ref 1.4–6.5)
NEUTROS PCT: 48 %
Platelets: 281 10*3/uL (ref 150–440)
RBC: 4.51 MIL/uL (ref 3.80–5.20)
RDW: 14.5 % (ref 11.5–14.5)
WBC: 6 10*3/uL (ref 3.6–11.0)

## 2015-09-28 LAB — URINALYSIS COMPLETE WITH MICROSCOPIC (ARMC ONLY)
Bilirubin Urine: NEGATIVE
Glucose, UA: >500 mg/dL — AB
HGB URINE DIPSTICK: NEGATIVE
LEUKOCYTES UA: NEGATIVE
NITRITE: NEGATIVE
PH: 5 (ref 5.0–8.0)
PROTEIN: NEGATIVE mg/dL
SPECIFIC GRAVITY, URINE: 1.027 (ref 1.005–1.030)

## 2015-09-28 LAB — GLUCOSE, CAPILLARY
Glucose-Capillary: 328 mg/dL — ABNORMAL HIGH (ref 65–99)
Glucose-Capillary: 298 mg/dL — ABNORMAL HIGH (ref 65–99)

## 2015-09-28 LAB — BASIC METABOLIC PANEL
Anion gap: 10 (ref 5–15)
BUN: 11 mg/dL (ref 6–20)
CHLORIDE: 101 mmol/L (ref 101–111)
CO2: 22 mmol/L (ref 22–32)
Calcium: 9.5 mg/dL (ref 8.9–10.3)
Creatinine, Ser: 0.75 mg/dL (ref 0.44–1.00)
GFR calc non Af Amer: >60 mL/min (ref >60)
Glucose, Bld: 356 mg/dL — ABNORMAL HIGH (ref 65–99)
POTASSIUM: 3.9 mmol/L (ref 3.5–5.1)
SODIUM: 133 mmol/L — AB (ref 135–145)

## 2015-09-28 LAB — POCT PREGNANCY, URINE: Preg Test, Ur: NEGATIVE

## 2015-09-28 MED ORDER — SODIUM CHLORIDE 0.9 % IV BOLUS (SEPSIS)
1000.0000 mL | Freq: Once | INTRAVENOUS | Status: AC
  Administered 2015-09-28: 1000 mL via INTRAVENOUS

## 2015-09-28 NOTE — ED Notes (Signed)
Patient was diagnosed as a diabetic yesterday after a 1 week history of nausea.  Blood sugar was 371 and this morning sugar was 498.  Started on Metformin BID, patient has taken three doses of medicine.  Patient checked blood sugar 1 hour ago, 350.  Metformin last taken 1 hour ago.  Patient c/o nausea.

## 2015-09-28 NOTE — ED Notes (Signed)
CBG 328 

## 2015-09-28 NOTE — ED Provider Notes (Addendum)
[facility] Emergency Department Provider Note  ____________________________________________   I have reviewed the triage vital signs and the nursing notes.   HISTORY  Chief Complaint Hyperglycemia    HPI Angela Henderson is a 24 y.o. female the family history diabetes who also unfortunate suffers from morbid obesity, presents today complaining of elevated blood sugar. Patient was actually diagnosed as diabetic yesterday and started on metformin she has had 3 total metformin pills. Patient states she is working to start modifying her diet although she has been eating well today. She states that she can tell her sugar is up she feels slightly nauseated. She has had no abdominal pain vomiting fever or chills or other concerns. She did check her sugar today was running high and she noted she was mildly nauseated so she came back in. She does have a primary care doctor but they're close. She denies any signs or symptoms of infection such as fever. She denies pregnancy.  Past Medical History  Diagnosis Date  . Diabetes mellitus without complication [facility](HCC)     Patient Active Problem List   Diagnosis Date Noted  . ANEMIA 08/06/2010  . SHORTNESS OF BREATH 08/06/2010  . FREQUENCY, URINARY 08/06/2010  . FATIGUE 05/30/2008  . 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 No pertinent past surgical history.  Current Outpatient Rx  Name  Route  Sig  Dispense  Refill  . ibuprofen (ADVIL,MOTRIN) 800 MG tablet   Oral   Take 1 tablet (800 mg total) by mouth 3 (three) times daily.   21 tablet   0   . levonorgestrel (MIRENA) 20 MCG/24HR IUD   Intrauterine   1 each by Intrauterine route once.         . methocarbamol (ROBAXIN) 500 MG tablet   Oral   Take 2 tablets (1,000 mg total) by mouth 4 (four) times daily.   30 tablet   0   . phenazopyridine (PYRIDIUM) 95 MG tablet   Oral   Take 1 tablet (95 mg total) by mouth 3 (three) times daily as needed for  pain.   10 tablet   0     Allergies Review of patient's allergies indicates no known allergies.  No family history on file.  Social History Social History  Substance Use Topics  . Smoking status: Never Smoker   . Smokeless tobacco: None  . Alcohol Use: Yes     Comment: weekends    Review of Systems Constitutional: No fever/chills Eyes: No visual changes. ENT: No sore throat. No stiff neck no neck pain Cardiovascular: Denies chest pain. Respiratory: Denies shortness of breath. Gastrointestinal:   no vomiting.  No diarrhea.  No constipation. Genitourinary: Negative for dysuria. Musculoskeletal: Negative lower extremity swelling Skin: Negative for rash. Neurological: Negative for headaches, focal weakness or numbness. 10-point ROS otherwise negative.  ____________________________________________   PHYSICAL EXAM:  VITAL SIGNS: ED Triage Vitals  Enc Vitals Group     BP 09/28/15 1839 146/86 mmHg     Pulse Rate 09/28/15 1839 91     Resp 09/28/15 1839 16     Temp 09/28/15 1839 98.7 F (37.1 C)     Temp Source 09/28/15 1839 Oral     SpO2 09/28/15 1839 97 %     Weight 09/28/15 1839 211 lb (95.709 kg)     Height 09/28/15 1839 5\' 6"  (1.676 m)     Head Cir --      Peak Flow --      Pain  Score 09/28/15 1841 0     Pain Loc --      Pain Edu? --      Excl. in GC? --     Constitutional: Alert and oriented. Well appearing and in no acute distress. Eyes: Conjunctivae are normal. PERRL. EOMI. Head: Atraumatic. Nose: No congestion/rhinnorhea. Mouth/Throat: Mucous membranes are moist.  Oropharynx non-erythematous. Neck: No stridor.   Nontender with no meningismus Cardiovascular: Normal rate, regular rhythm. Grossly normal heart sounds.  Good peripheral circulation. Respiratory: Normal respiratory effort.  No retractions. Lungs CTAB. Abdominal: Soft and nontender. No distention. No guarding no rebound Back:  There is no focal tenderness or step off there is no midline  tenderness there are no lesions noted. there is no CVA tenderness Musculoskeletal: No lower extremity tenderness. No joint effusions, no DVT signs strong distal pulses no edema Neurologic:  Normal speech and language. No gross focal neurologic deficits are appreciated.  Skin:  Skin is warm, dry and intact. No rash noted. Psychiatric: Mood and affect are normal. Speech and behavior are normal.  ____________________________________________   LABS (all labs ordered are listed, but only abnormal results are displayed)  Labs Reviewed  BASIC METABOLIC PANEL - Abnormal; Notable for the following:    Sodium 133 (*)    Glucose, Bld 356 (*)    All other components within normal limits  URINALYSIS COMPLETEWITH MICROSCOPIC ([facility] ONLY) - Abnormal; Notable for the following:    Color, Urine YELLOW (*)    APPearance CLEAR (*)    Glucose, UA >500 (*)    Ketones, ur 1+ (*)    Bacteria, UA RARE (*)    Squamous Epithelial / LPF 0-5 (*)    All other components within normal limits  GLUCOSE, CAPILLARY - Abnormal; Notable for the following:    Glucose-Capillary 328 (*)    All other components within normal limits  CBC WITH DIFFERENTIAL/PLATELET  POC URINE PREG, ED  POCT PREGNANCY, URINE   ____________________________________________  EKG  I personally interpreted any EKGs ordered by me or triage  ____________________________________________  RADIOLOGY  I reviewed any imaging ordered by me or triage that were performed during my shift ____________________________________________   PROCEDURES  Procedure(s) performed: None  Critical Care performed: None  ____________________________________________   INITIAL IMPRESSION / ASSESSMENT AND PLAN / ED COURSE  Pertinent labs & imaging results that were available during my care of the patient were reviewed by me and considered in my medical decision making (see chart for details).  Patient with elevated blood sugar likely some degree of  dehydration given that she has had high sugars for last several weeks before diagnoses. We'll give her IV fluid and reassessed. She has no elevated white count no fever no evidence of UTI she is not pregnant, she has no anion gap, there is nothing to suggest DKA. We will give her IV fluid, 2 L, and reassessed. Her sugar is already coming down.   ----------------------------------------- 9:37 PM on 09/28/2015 -----------------------------------------  Patient feels 100% better after IV fluid. Obviously, our goal was not to cure her diabetes or make her sugar perfect, it is to make sure she is not too dehydrated and allow her physicians to continue to manage her diabetes. I've advised her to take an extra metformin this evening she takes apparently 500 twice a day and to continue to watch her diet. She will drink plenty of fluids and return to the emergency room she feels worse in any way. There is no evidence of DKA or significant  palpitation from this and she has serial negative abdominal exams with no evidence of acute pathology aside from it is likely chronically elevated blood glucose. ____________________________________________   FINAL CLINICAL IMPRESSION(S) / ED DIAGNOSES  Final diagnoses:  None     Jeanmarie Plant, MD 09/28/15 1951  Jeanmarie Plant, MD 09/28/15 2138

## 2015-09-28 NOTE — Discharge Instructions (Signed)
We advised that you continue to watch her diet drink plenty of fluids he may take an extra metformin tonight before you go to bed feeling to sugars dropping too quickly, keep juice nearby and be with family tonight. Usually however metformin does not have the side effect of rapidly dropping sugar, fortunately. Follow closely with primary care doctor for further management of your blood sugars. If her sugars are reading consistently over 500 or you feel otherwise ill including vomiting or weakness or abdominal pain or worse in any other way please return to the emergency department.

## 2015-10-17 ENCOUNTER — Encounter (HOSPITAL_COMMUNITY): Payer: Self-pay | Admitting: Neurology

## 2015-10-17 ENCOUNTER — Emergency Department (HOSPITAL_COMMUNITY)
Admission: EM | Admit: 2015-10-17 | Discharge: 2015-10-17 | Disposition: A | Discharge status: Home / Self Care | Payer: 59 | Attending: Emergency Medicine | Admitting: Emergency Medicine

## 2015-10-17 DIAGNOSIS — R739 Hyperglycemia, unspecified: Secondary | ICD-10-CM

## 2015-10-17 DIAGNOSIS — Z79899 Other long term (current) drug therapy: Secondary | ICD-10-CM | POA: Insufficient documentation

## 2015-10-17 DIAGNOSIS — R112 Nausea with vomiting, unspecified: Secondary | ICD-10-CM | POA: Insufficient documentation

## 2015-10-17 DIAGNOSIS — E1165 Type 2 diabetes mellitus with hyperglycemia: Secondary | ICD-10-CM | POA: Insufficient documentation

## 2015-10-17 DIAGNOSIS — N898 Other specified noninflammatory disorders of vagina: Secondary | ICD-10-CM | POA: Insufficient documentation

## 2015-10-17 LAB — CBC
HEMATOCRIT: 38.1 % (ref 36.0–46.0)
Hemoglobin: 12.4 g/dL (ref 12.0–15.0)
MCH: 26.8 pg (ref 26.0–34.0)
MCHC: 32.5 g/dL (ref 30.0–36.0)
MCV: 82.3 fL (ref 78.0–100.0)
PLATELETS: 290 10*3/uL (ref 150–400)
RBC: 4.63 MIL/uL (ref 3.87–5.11)
RDW: 13.3 % (ref 11.5–15.5)
WBC: 5.3 10*3/uL (ref 4.0–10.5)

## 2015-10-17 LAB — BASIC METABOLIC PANEL
Anion gap: 13 (ref 5–15)
BUN: 9 mg/dL (ref 6–20)
CHLORIDE: 96 mmol/L — AB (ref 101–111)
CO2: 21 mmol/L — ABNORMAL LOW (ref 22–32)
Calcium: 9.6 mg/dL (ref 8.9–10.3)
Creatinine, Ser: 1.18 mg/dL — ABNORMAL HIGH (ref 0.44–1.00)
Glucose, Bld: 487 mg/dL — ABNORMAL HIGH (ref 65–99)
POTASSIUM: 3.9 mmol/L (ref 3.5–5.1)
SODIUM: 130 mmol/L — AB (ref 135–145)

## 2015-10-17 LAB — URINALYSIS, ROUTINE W REFLEX MICROSCOPIC
Bilirubin Urine: NEGATIVE
KETONES UR: 40 mg/dL — AB
LEUKOCYTES UA: NEGATIVE
NITRITE: NEGATIVE
PROTEIN: NEGATIVE mg/dL
Specific Gravity, Urine: 1.039 — ABNORMAL HIGH (ref 1.005–1.030)
pH: 5 (ref 5.0–8.0)

## 2015-10-17 LAB — CBG MONITORING, ED
GLUCOSE-CAPILLARY: 375 mg/dL — AB (ref 65–99)
GLUCOSE-CAPILLARY: 296 mg/dL — AB (ref 65–99)
Glucose-Capillary: 444 mg/dL — ABNORMAL HIGH (ref 65–99)

## 2015-10-17 LAB — I-STAT BETA HCG BLOOD, ED (MC, WL, AP ONLY)

## 2015-10-17 LAB — WET PREP, GENITAL
CLUE CELLS WET PREP: NONE SEEN
SPERM: NONE SEEN
Trich, Wet Prep: NONE SEEN
Yeast Wet Prep HPF POC: NONE SEEN

## 2015-10-17 LAB — URINE MICROSCOPIC-ADD ON

## 2015-10-17 MED ORDER — SODIUM CHLORIDE 0.9 % IV BOLUS (SEPSIS)
1000.0000 mL | Freq: Once | INTRAVENOUS | Status: AC
  Administered 2015-10-17: 1000 mL via INTRAVENOUS

## 2015-10-17 MED ORDER — INSULIN ASPART 100 UNIT/ML ~~LOC~~ SOLN
8.0000 [IU] | Freq: Once | SUBCUTANEOUS | Status: AC
  Administered 2015-10-17: 8 [IU] via INTRAVENOUS
  Filled 2015-10-17: qty 1

## 2015-10-17 MED ORDER — ONDANSETRON 4 MG PO TBDP: 4.0000 mg | ORAL_TABLET | Freq: Three times a day (TID) | ORAL | Status: DC | PRN

## 2015-10-17 MED ORDER — ONDANSETRON HCL 4 MG/2ML IJ SOLN
4.0000 mg | Freq: Once | INTRAMUSCULAR | Status: AC
  Administered 2015-10-17: 4 mg via INTRAVENOUS
  Filled 2015-10-17: qty 2

## 2015-10-17 NOTE — ED Notes (Signed)
Pt reports nausea today while at work, had emesis x 1. Is type 2 diabetic, just started taking metformin 1 month ago. Reports sugars have been running high at home in the 400's. Pt denies pain, is a x 4. In NAD. VSS

## 2015-10-17 NOTE — ED Notes (Signed)
CBG 444. 

## 2015-10-17 NOTE — ED Provider Notes (Signed)
[facility]'s care [facility] from [facility] PA-[facility], please see their note for full H&P. In brief, Angela Henderson is a 24 y.o. female with a PMH type 2 DM on metformin, presents with emesis and hyperglycemia. Glu 444. No signs of infection. No DKA. Given IVF and insulin. Getting 2nd 1L NS now. Will repeat glu and d/c home with PCP follow up.    On re-evaluation, glu 296. No longer with emesis and nausea. Patient stable for discharge home. Tolerating PO. Discharged home with a prescription for zofran.  Discussed follow up with PCP for medication changes. Discussed strict return to the emergency department. Patient expressed understanding, no questions or concerns at time of discharge.     Angela HerterShannon Letia Guidry, MD 10/18/15 [phone]  Angela HongBrian Miller, MD 10/18/15 [phone]

## 2015-10-17 NOTE — ED Provider Notes (Signed)
CSN: [id number]     Arrival date & time 10/17/15  1222 History   First MD Initiated Contact with Patient 10/17/15 1332     Chief Complaint  Patient presents with  . Emesis     (Consider location/radiation/quality/duration/timing/severity/associated sxs/prior Treatment) HPI   Patient is a 24 year old female with past medical history of type 2 diabetes who presents to the ED with complaint of nausea, onset this morning. Patient reports having worsening nausea with dry heaving. She also endorses having associated urinary frequency, vaginal itching and white vaginal discharge. Denies fever, chills, headache, visual changes, sore throat, cough, SOB, CP, abdominal pain, hematemesis, diarrhea, blood in urine/stool, dysuria, numbness, tingling, weakness. LMP last week. Patient reports she is sexually active and uses condoms. She reports she was recently diagnosed with type 2 diabetes approximately one month ago and was started on 500 mg metformin twice a day. She notes her BGLs at home have been in the 400s. Patient was seen at [facility] ED on 11/11 for nausea, CBG 356, no aniongap, given IVF and d/c home. Patient reports she had a scheduled appointment with her PCP yesterday but notes she missed her appointment.  Past Medical History  Diagnosis Date  . Diabetes mellitus without complication (HCC)    History reviewed. No pertinent past surgical history. No family history on file. Social History  Substance Use Topics  . Smoking status: Never Smoker   . Smokeless tobacco: None  . Alcohol Use: Yes     Comment: weekends   OB History    No data available     Review of Systems  Gastrointestinal: Positive for nausea and vomiting.  All other systems reviewed and are negative.     Allergies  Review of patient's allergies indicates no known allergies.  Home Medications   Prior to Admission medications   Medication Sig Start Date End Date Taking? Authorizing Provider  metFORMIN (GLUCOPHAGE) 500  MG tablet Take 500 mg by mouth 2 (two) times daily.   Yes Historical Provider, MD  ibuprofen (ADVIL,MOTRIN) 800 MG tablet Take 1 tablet (800 mg total) by mouth 3 (three) times daily. Patient not taking: Reported on 10/17/2015 12/16/14   Renne Crigler, PA-C  methocarbamol (ROBAXIN) 500 MG tablet Take 2 tablets (1,000 mg total) by mouth 4 (four) times daily. Patient not taking: Reported on 10/17/2015 12/16/14   Renne Crigler, PA-C  phenazopyridine (PYRIDIUM) 95 MG tablet Take 1 tablet (95 mg total) by mouth 3 (three) times daily as needed for pain. Patient not taking: Reported on 10/17/2015 08/31/14   Victorino Dike Piepenbrink, PA-C   BP 120/78 mmHg  Pulse 72  Temp(Src) 98.7 F (37.1 C) (Oral)  Resp 16  SpO2 100%  LMP 09/11/2015 Physical Exam  Constitutional: She is oriented to person, place, and time. She appears well-developed and well-nourished. No distress.  HENT:  Head: Normocephalic and atraumatic.  Mouth/Throat: Oropharynx is clear and moist. No oropharyngeal exudate.  Eyes: Conjunctivae and EOM are normal. Right eye exhibits no discharge. Left eye exhibits no discharge. No scleral icterus.  Neck: Normal range of motion. Neck supple.  Cardiovascular: Normal rate, regular rhythm, normal heart sounds and intact distal pulses.   Pulmonary/Chest: Effort normal and breath sounds normal. No respiratory distress. She has no wheezes. She has no rales. She exhibits no tenderness.  Abdominal: Soft. Bowel sounds are normal. She exhibits no distension and no mass. There is no tenderness. There is no rebound and no guarding.  Musculoskeletal: Normal range of motion. She exhibits no edema.  Lymphadenopathy:    She has no cervical adenopathy.  Neurological: She is alert and oriented to person, place, and time.  Skin: Skin is warm and dry. She is not diaphoretic.  Nursing note and vitals reviewed.   ED Course  Procedures (including critical care time) Labs Review Labs Reviewed  WET PREP, GENITAL -  Abnormal; Notable for the following:    WBC, Wet Prep HPF POC MANY (*)    All other components within normal limits  BASIC METABOLIC PANEL - Abnormal; Notable for the following:    Sodium 130 (*)    Chloride 96 (*)    CO2 21 (*)    Glucose, Bld 487 (*)    Creatinine, Ser 1.18 (*)    All other components within normal limits  URINALYSIS, ROUTINE W REFLEX MICROSCOPIC (NOT AT [facility]) - Abnormal; Notable for the following:    Specific Gravity, Urine 1.039 (*)    Glucose, UA >1000 (*)    Hgb urine dipstick TRACE (*)    Ketones, ur 40 (*)    All other components within normal limits  URINE MICROSCOPIC-ADD ON - Abnormal; Notable for the following:    Squamous Epithelial / LPF 0-5 (*)    Bacteria, UA RARE (*)    All other components within normal limits  CBG MONITORING, ED - Abnormal; Notable for the following:    Glucose-Capillary 444 (*)    All other components within normal limits  CBC  HIV ANTIBODY (ROUTINE TESTING)  I-STAT BETA HCG BLOOD, ED (MC, WL, AP ONLY)  CBG MONITORING, ED  GC/CHLAMYDIA PROBE AMP ([place]) NOT AT [facility]    Imaging Review No results found. I have personally reviewed and evaluated these images and lab results as part of my medical decision-making.  Pelvic exam: normal external genitalia, vulva, vagina, cervix, uterus and adnexa, VULVA: normal appearing vulva with no masses, tenderness or lesions, VAGINA: normal appearing vagina with normal color and discharge, no lesions, vaginal discharge - white, green and mucoid, WET MOUNT done - results: white blood cells, DNA probe for chlamydia and GC obtained, CERVIX: normal appearing cervix without discharge or lesions, UTERUS: uterus is normal size, shape, consistency and nontender, ADNEXA: normal adnexa in size, nontender and no masses, exam chaperoned by female nurse.   MDM   Final diagnoses:  None    Patient presents with nausea/vomiting x1 and elevated glucose level. Patient reports she was diagnosed with  diabetes one month ago and has since been taking 500 mg metformin twice a day. VSS. Exam unremarkable. Pelvic exam revealed white/green mucoid discharge, otherwise unremarkable. Patient given Zofran and IVF. CBG 444, no anion gap. Pregnancy negative. Labs unremarkable. No evidence of UTI. Patient reports nausea has improved.  CBG decreased to 375 s/p IVF. Plan to give pt 8U IV insulin and 1L IVF. Will reevaluate CBG after IVF and insulin, then plan to d/c home and f/u with PCP this week.  Hand off to Corena HerterShannon Mumma, MD. Plan to recheck CBG s/p insulin and IVF.   Satira Sarkicole Elizabeth Harts[place]Nadeau, [place] JerseyPA-C 10/17/15 1651  Blane OharaJoshua Zavitz, MD 10/17/15 1759

## 2015-10-17 NOTE — Discharge Instructions (Signed)
Continue taking your medications as prescribed. Follow-up with your primary care provider this week. Please return to the Emergency Department if symptoms worsen or new onset of fever, difficulty breathing, chest pain, vomiting, abdominal pain.    Emergency Department Resource Guide 1) Find a Doctor and Pay Out of Pocket Although you won't have to find out who is covered by your insurance plan, it is a good idea to ask around and get recommendations. You will then need to call the office and see if the doctor you have chosen will accept you as a new patient and what types of options they offer for patients who are self-pay. Some doctors offer discounts or will set up payment plans for their patients who do not have insurance, but you will need to ask so you aren't surprised when you get to your appointment.  2) Contact Your Local Health Department Not all health departments have doctors that can see patients for sick visits, but many do, so it is worth a call to see if yours does. If you don't know where your local health department is, you can check in your phone book. The CDC also has a tool to help you locate your state's health department, and many state websites also have listings of all of their local health departments.  3) Find a Walk-in Clinic If your illness is not likely to be very severe or complicated, you may want to try a walk in clinic. These are popping up all over the country in pharmacies, drugstores, and shopping centers. They're usually staffed by nurse practitioners or physician assistants that have been trained to treat common illnesses and complaints. They're usually fairly quick and inexpensive. However, if you have serious medical issues or chronic medical problems, these are probably not your best option.  No Primary Care Doctor: - Call Health Connect at  [phone] - they can help you locate a primary care doctor that  accepts your insurance, provides certain services,  etc. - Physician Referral Service- [phone]  Chronic Pain Problems: Organization         Address  Phone   Notes  [facility]  [phone] Patients need to be referred by their primary care doctor.   Medication Assistance: Organization         Address  Phone   Notes  [facility] [address] [place], [place] [address] [phone] --Must be a resident of [facility] -- Must have NO insurance coverage whatsoever (no Medicaid/ Medicare, etc.) -- The pt. MUST have a primary care doctor that directs their care regularly and follows them in the community   MedAssist  [phone]   [employer]  [phone]    Agencies that provide inexpensive medical care: Organization         Address  Phone   Notes  [facility]  [phone]   [facility]    [phone]   [facility] [address] [place], [place] [address] [phone]   [facility] [address], [place] [phone]   Planned Parenthood    [phone]   [facility]    [phone]   [facility]  [address], [place] Phone:  [phone], Fax:  [phone] Hours of Operation:  9 am - 6 pm, M-F.  Also accepts Medicaid/Medicare and self-pay.  [facility]  [address], [place] Phone: [phone], Fax: [phone]. Hours of Operation:  8:30 am - 5:30 pm, M-F.  Also accepts Medicaid and self-pay.  [facility] [place] [address] [place] Phone: [phone]   [facility] [address] Natasha Bence [place], [place] [phone], Ext. [phone] Mondays & Thursdays: 7-9 AM.  First 15 patients are seen on a first come, first serve basis.    Medicaid-accepting [facility] Providers:  Organization         Address  Phone   Notes  [facility] [address], [place] [phone] Also accepts self-pay patients.  [facility] [address] Laurell Josephs [place], [place]  [phone]   [facility] [address], [place] [phone]   [facility] [address] [place] [phone]   Renaye Rakers [address], [place]   [phone] Only accepts [place] Access [place] patients after they have their name applied to their card.   Self-Pay (no insurance) in [facility]:  Organization         Address  Phone   Notes  Sickle Cell Patients, [facility] [address] [place], [place] [phone]   [facility] [address] [place], [place] [phone]   [facility] [place]  [address], [place] [phone]   [facility]/Dr. Osei-Bonsu  [address], [place] or [address], [place] [phone] Phone number for both [place] and [place] locations is the same.  [facility]gent [facility] and [facility] [address], [place] [phone]   [facility] [address] [place] or [address] [phone]   [facility] [address], [place] [phone], phone; [phone], fax Sees patients 1st and 3rd Saturday of every month.  Must not qualify for public or private insurance (i.e. Medicaid, Medicare, [place] Health Choice, Veterans' Benefits)  Household income should be no more than 200% of the poverty level The clinic cannot treat you if you are pregnant or think you are pregnant  Sexually transmitted diseases are not treated at the clinic.    Dental Care: Organization         Address  Phone  Notes  [facility] [address] [place], [place] [phone] Accepts children up to  age 8 who are enrolled in [place] or [place] Health Choice; pregnant women with a Medicaid card; and children who have applied for Medicaid or [place] Health Choice, but were declined, whose parents can pay a reduced fee at time of service.  [facility]  [address], [place] [phone] Accepts children up to age 37 who are enrolled in [place] or [place] Health Choice; pregnant women with a Medicaid card; and children who have applied for Medicaid or [place] Health Choice, but were declined, whose parents can pay a reduced fee at time of service.  [facility]  [address] [place], [place] [phone] Patients are seen by appointment only. Walk-ins are not accepted. [facility] will see patients 50 years of age and older. Monday - Tuesday (8am-5pm) Most Wednesdays (8:30-5pm) $30 per visit, cash  only  [facility]  [address], [place] [phone] Patients are seen by appointment only. Walk-ins are not accepted. [facility] will see patients 26 years of age and older. One Wednesday Evening (Monthly: Volunteer Based).  $30 per visit, cash only  [employer] [facility]  [phone] for adults; Children under age 494, call Graduate Pediatric Dentistry at [phone]. Children aged 524-14, please call [phone] to request a pediatric application.  Dental services are provided in all areas of dental care including fillings, crowns and bridges, complete and partial dentures, implants, gum treatment, root canals, and extractions. Preventive care is also provided. Treatment is provided to both adults and children. Patients are selected via a lottery and there is often a waiting list.   [facility] [address], [place]  [phone] [email or web address]   [facility] [address], [place], [place] [phone], Ext. [phone] Second and Fourth Thursday of  each month, opens at 6:30 AM; Clinic ends at 9 AM.  Patients are seen on a first-come first-served basis, and a limited number are seen during each clinic.   [facility]  [address], [place], [place] [phone]) [phone]   Eligibility Requirements You must have lived in [place], [place], or [place] counties for at least the last three months.   You cannot be eligible for state or federal sponsored National Cityhealthcare insurance, including CIGNAVeterans Administration, [place]icaid, or [employer] EntertainmentMedicare.   You generally cannot be eligible for healthcare insurance through your employer.    How to apply: Eligibility screenings are held every Tuesday and Wednesday afternoon from 1:00 pm until 4:00 pm. You do not need an appointment for the interview!  [facility] [address], [place], [place] [phone]   [facility]  [phone]   [facility]  [phone]   [facility]  [phone]    Behavioral Health Resources in the Community: Intensive Outpatient Programs Organization         Address  Phone  Notes  [facility] [address], [place], [place] [phone]   [facility] [address], [place], [place] [phone]   [facility] [address], [place], [place]  [phone]   [facility] [address],  [place], [place] [phone] or [phone]   Substance Abuse Resources Organization         Address  Phone  Notes  [facility] Drug Services  [phone]   [facility]  [phone]   [facility]  [phone]   [facility]  [phone]   Residential & Outpatient Substance Abuse Program  [phone]   Psychological Services Organization         Address  Phone  Notes  [facility]  [phone]- [phone]   [facility]  [phone]- [phone]   [facility] [address],  [place] [phone] or [phone]    Mobile Crisis Teams Organization         Address  Phone  Notes  [facility], Mobile Crisis Care Unit  [phone]   [facility]  [address] [place], [place] [phone]   Doristine LocksSharon DeEsch [address] [place] [phone]    Self-Help/Support Groups Organization         Address  Phone  Notes  [facility] of [place] - variety of support groups  [place] Call for more information  Narcotics Anonymous (NA), [facility] [address], [employer] [place]  2 meetings at this location   Special educational needs teacher         Address  Phone  Notes  ASAP Residential Treatment [place],    [place]  [phone]   [facility]  [address] [place] [phone], [place], [place]   [place], [place] [phone] Admissions: 8am-3pm M-F  Incentives Substance [place] [address],    [place], [place] [phone]   [facility] [address] [place], [place], [place]   [facility] [address]  [place], [place]   [facility] Programs - Intensive Outpatient [address], [place], [place]   [facility] [address]  [place], [place] [phone] or [phone]   [facility] ([facility]) [address] [place], [place] Accepts Medicaid  [facility] [address]  [place] [phone] Substance Abuse/Addiction Treatment   [facility] Organization         Address  Phone  Notes  [facility]  [phone]   Domenic Schwab, PhD [address] Arlis Porta [place], [place]   [phone] or [phone]   [place], [place] [phone]     [facility] [address] [place], [place] [phone] Insurance/Medicaid/sponsorship through [facility] [address]                                    [place], [place] [phone] [place] [address], [place] [phone]    Dr. Adele Schilder  [phone]   [facility]. 1) [address], [place] 2) [place] 3)  [address], [place] [phone]  [phone]   [place] [phone] or [phone] (After Hours)

## 2015-10-18 LAB — GC/CHLAMYDIA PROBE AMP (~~LOC~~) NOT AT ARMC
CHLAMYDIA, DNA PROBE: NEGATIVE
Neisseria Gonorrhea: NEGATIVE

## 2015-10-18 LAB — HIV ANTIBODY (ROUTINE TESTING W REFLEX): HIV SCREEN 4TH GENERATION: NONREACTIVE

## 2015-10-19 ENCOUNTER — Emergency Department (HOSPITAL_COMMUNITY)
Admission: EM | Admit: 2015-10-19 | Discharge: 2015-10-19 | Disposition: A | Discharge status: Home / Self Care | Payer: 59 | Attending: Emergency Medicine | Admitting: Emergency Medicine

## 2015-10-19 ENCOUNTER — Encounter (HOSPITAL_COMMUNITY): Payer: Self-pay | Admitting: Family Medicine

## 2015-10-19 DIAGNOSIS — R739 Hyperglycemia, unspecified: Secondary | ICD-10-CM

## 2015-10-19 DIAGNOSIS — Z79899 Other long term (current) drug therapy: Secondary | ICD-10-CM | POA: Insufficient documentation

## 2015-10-19 DIAGNOSIS — E1165 Type 2 diabetes mellitus with hyperglycemia: Secondary | ICD-10-CM | POA: Insufficient documentation

## 2015-10-19 LAB — URINE MICROSCOPIC-ADD ON

## 2015-10-19 LAB — CBC
HCT: 37.5 % (ref 36.0–46.0)
Hemoglobin: 12.2 g/dL (ref 12.0–15.0)
MCH: 26.8 pg (ref 26.0–34.0)
MCHC: 32.5 g/dL (ref 30.0–36.0)
MCV: 82.4 fL (ref 78.0–100.0)
Platelets: 300 10*3/uL (ref 150–400)
RBC: 4.55 MIL/uL (ref 3.87–5.11)
RDW: 13.5 % (ref 11.5–15.5)
WBC: 5.7 10*3/uL (ref 4.0–10.5)

## 2015-10-19 LAB — URINALYSIS, ROUTINE W REFLEX MICROSCOPIC
Glucose, UA: >1000 mg/dL — AB
Hgb urine dipstick: NEGATIVE
Leukocytes, UA: NEGATIVE
NITRITE: NEGATIVE
Protein, ur: NEGATIVE mg/dL
Specific Gravity, Urine: 1.03 (ref 1.005–1.030)
pH: 5 (ref 5.0–8.0)

## 2015-10-19 LAB — BASIC METABOLIC PANEL
Anion gap: 11 (ref 5–15)
CALCIUM: 9.6 mg/dL (ref 8.9–10.3)
CO2: 21 mmol/L — ABNORMAL LOW (ref 22–32)
CREATININE: 0.8 mg/dL (ref 0.44–1.00)
Chloride: 102 mmol/L (ref 101–111)
GFR calc Af Amer: >60 mL/min (ref >60)
GLUCOSE: 285 mg/dL — AB (ref 65–99)
Potassium: 4 mmol/L (ref 3.5–5.1)
Sodium: 134 mmol/L — ABNORMAL LOW (ref 135–145)

## 2015-10-19 LAB — CBG MONITORING, ED: GLUCOSE-CAPILLARY: 265 mg/dL — AB (ref 65–99)

## 2015-10-19 NOTE — ED Provider Notes (Signed)
CSN: [id number]     Arrival date & time 10/19/15  1840 History   First MD Initiated Contact with Patient 10/19/15 2042     Chief Complaint  Patient presents with  . Hyperglycemia     (Consider location/radiation/quality/duration/timing/severity/associated sxs/prior Treatment) HPI Comments: Is a 24 year old newly diagnosed with diabetes mellitus has been started on metformin has been unable to see her primary care physician.  Since his emergency room tonight because of persistent hyperglycemia  Patient is a 24 y.o. female presenting with hyperglycemia. The history is provided by the patient.  Hyperglycemia Blood sugar level PTA:  200-300 Severity:  Mild Timing:  Constant Progression:  Unchanged Chronicity:  New Current diabetic treatments: new DM. Associated symptoms: nausea   Associated symptoms: no abdominal pain, no dizziness, no fever and no vomiting     Past Medical History  Diagnosis Date  . Diabetes mellitus without complication (HCC)    History reviewed. No pertinent past surgical history. History reviewed. No pertinent family history. Social History  Substance Use Topics  . Smoking status: Never Smoker   . Smokeless tobacco: None  . Alcohol Use: Yes     Comment: weekends   OB History    No data available     Review of Systems  Constitutional: Negative for fever and chills.  Gastrointestinal: Positive for nausea. Negative for vomiting and abdominal pain.  Neurological: Negative for dizziness.  All other systems reviewed and are negative.     Allergies  Review of patient's allergies indicates no known allergies.  Home Medications   Prior to Admission medications   Medication Sig Start Date End Date Taking? Authorizing Provider  metFORMIN (GLUCOPHAGE) 500 MG tablet Take 500 mg by mouth 2 (two) times daily.   Yes Historical Provider, MD  ibuprofen (ADVIL,MOTRIN) 800 MG tablet Take 1 tablet (800 mg total) by mouth 3 (three) times daily. Patient not  taking: Reported on 10/17/2015 12/16/14   Renne Crigler, PA-C  methocarbamol (ROBAXIN) 500 MG tablet Take 2 tablets (1,000 mg total) by mouth 4 (four) times daily. Patient not taking: Reported on 10/17/2015 12/16/14   Renne Crigler, PA-C  ondansetron (ZOFRAN ODT) 4 MG disintegrating tablet Take 1 tablet (4 mg total) by mouth every 8 (eight) hours as needed for nausea. Patient not taking: Reported on 10/19/2015 10/17/15   Eber Hong, MD  phenazopyridine (PYRIDIUM) 95 MG tablet Take 1 tablet (95 mg total) by mouth 3 (three) times daily as needed for pain. Patient not taking: Reported on 10/17/2015 08/31/14   Victorino Dike Piepenbrink, PA-C   BP 126/93 mmHg  Pulse 69  Temp(Src) 98.4 F (36.9 C) (Oral)  Resp 14  Ht  (1.676 m)  Wt 94.802 kg  BMI 33.75 kg/m2  SpO2 100%  LMP 09/11/2015 Physical Exam  Constitutional: She is oriented to person, place, and time. She appears well-developed and well-nourished.  HENT:  Head: Normocephalic.  Eyes: Pupils are equal, round, and reactive to light.  Neck: Normal range of motion.  Cardiovascular: Normal rate and regular rhythm.   Pulmonary/Chest: Effort normal.  Abdominal: Soft. She exhibits no distension.  Musculoskeletal: Normal range of motion.  Neurological: She is alert and oriented to person, place, and time.  Skin: Skin is warm and dry.  Nursing note and vitals reviewed.   ED Course  Procedures (including critical care time) Labs Review Labs Reviewed  BASIC METABOLIC PANEL - Abnormal; Notable for the following:    Sodium 134 (*)    CO2 21 (*)    Glucose,  Bld 285 (*)    BUN <5 (*)    All other components within normal limits  URINALYSIS, ROUTINE W REFLEX MICROSCOPIC (NOT AT [facility]) - Abnormal; Notable for the following:    Glucose, UA >1000 (*)    Bilirubin Urine SMALL (*)    Ketones, ur >80 (*)    All other components within normal limits  URINE MICROSCOPIC-ADD ON - Abnormal; Notable for the following:    Squamous Epithelial / LPF  0-5 (*)    Bacteria, UA RARE (*)    All other components within normal limits  CBG MONITORING, ED - Abnormal; Notable for the following:    Glucose-Capillary 265 (*)    All other components within normal limits  CBC    Imaging Review No results found. I have personally reviewed and evaluated these images and lab results as part of my medical decision-making.   EKG Interpretation None     She was seen by Burna MortimerWanda case management who arranged for patient appointment.  She asked that prescription be written for Imitrex, testing strips and lancets which has been done.  Patient will follow-up with nutrition counseling as well MDM   Final diagnoses:  Hyperglycemia         Earley FavorGail Jermanie Minshall, NP 10/19/15 2229  Lorre NickAnthony Allen, MD 10/20/15 [phone]

## 2015-10-19 NOTE — Discharge Instructions (Signed)
Seen by case management Burna MortimerWanda, who arranged for you to have a follow-up appointment with the [facility] Center.  He will also been given him written prescriptions for Imitrex, and her testing strips and lancets.  These make sure to keep your appointment as scheduled.  Continue taking your metformin as prescribed.  You also have been given basic information on diabetes including carbohydrate counting diet and exercise, and foods to eat and avoid

## 2015-10-19 NOTE — Care Management (Signed)
CM consulted by Angela RollsG. Schulz NP regarding establishing care to follow up. Patient is a newly diagnosed diabetic, on Metformin with CBG-400's. Patient had 3 Ed visits related to her diabetes in the past month. Patient is concerned that her CBG continues to remain despite Metformin. [facility], patient is agreeable.  Patient does not have much insight in the management of her diabetes, this [facility], patient is in agreement. Ref[facility]. Patient reports she not having a glucometer with affordable strips, discussed ED provider writing a prescription for True Metrix glucometer and strips and lancets. Upon discharge.  CM explained  that office will contact patient to arrange an appointment for diabetic education. [facility] at 12:00n with Angela Henderson, teach back done patient verbalized understanding. Angela RollsG. Schulz NP updated.  [facility] to continue to follow up with patient. No further CM needs identified.

## 2015-10-19 NOTE — ED Notes (Signed)
Pt here with continued problems with hyperglycemia. sts her blood sugars have been ranging in the 300s even after taking metformin. sts nausea.

## 2015-10-19 NOTE — ED Notes (Signed)
Case Management at bedside.

## 2015-10-22 ENCOUNTER — Telehealth: Payer: Self-pay

## 2015-10-22 NOTE — Telephone Encounter (Signed)
Request received from Michel BickersWandalyn Rogers, RN CM for follow up for the [facility] to [facility] on her status and to discuss services that can be provided [facility].  Call placed to # [phone] and a HIPAA compliant  voice mail message was left requesting a call back to # [phone] or [phone].  The patient has an appointment scheduled with Dr Venet[facility]o at [facility] on 10/25/15 @ 1215.   Update provided to Beecher McardleW. Rogers, RN CM.

## 2015-10-23 ENCOUNTER — Telehealth: Payer: Self-pay

## 2015-10-23 NOTE — Telephone Encounter (Signed)
Call placed to the patient to remind her of her appointment at [facility] on 10/25/15 @ 1215.  She said that she is aware of the appointment .  She noted that she is "not feeling as bad" as she was when she went to the hospital. She said that she has all of her medications and is taking them as ordered. She also said that she has a one touch mini  glucometer and has been checking her blood sugars and keeping a log of the blood sugars in an app on her phone.  She reported that the blood sugars have been "running high" up to 300-400's. She said that she has not experienced any signs/symptoms of hyperglycemia and has been watching what she eats.  She is anxious to have more diabetic education and was agreeable to scheduling an [facility] for education.  An appointment was scheduled for 10/25/15 @ 1130 before her appointment with Dr Venetia NightAmao and she was very appreciative of the appointment. Instructed her to bring her medications and her glucometer to her appointment and she verbalized understanding.

## 2015-10-25 ENCOUNTER — Inpatient Hospital Stay: Payer: 59 | Admitting: Family Medicine

## 2015-10-25 ENCOUNTER — Encounter: Payer: 59 | Admitting: Pharmacist

## 2015-11-02 ENCOUNTER — Telehealth: Payer: Self-pay | Admitting: *Deleted

## 2015-11-02 ENCOUNTER — Ambulatory Visit: Payer: 59 | Attending: Family Medicine | Admitting: Internal Medicine

## 2015-11-02 ENCOUNTER — Encounter: Payer: Self-pay | Admitting: Internal Medicine

## 2015-11-02 ENCOUNTER — Observation Stay (HOSPITAL_COMMUNITY)
Admission: EM | Admit: 2015-11-02 | Discharge: 2015-11-03 | Disposition: A | Discharge status: Home / Self Care | Payer: Managed Care, Other (non HMO) | Attending: Internal Medicine | Admitting: Internal Medicine

## 2015-11-02 ENCOUNTER — Encounter (HOSPITAL_COMMUNITY): Payer: Self-pay | Admitting: Emergency Medicine

## 2015-11-02 VITALS — BP 136/84 | HR 85 | Temp 98.2°F | Resp 18 | Ht 66.0 in | Wt 197.0 lb

## 2015-11-02 DIAGNOSIS — E1110 Type 2 diabetes mellitus with ketoacidosis without coma: Secondary | ICD-10-CM | POA: Diagnosis present

## 2015-11-02 DIAGNOSIS — E1010 Type 1 diabetes mellitus with ketoacidosis without coma: Secondary | ICD-10-CM | POA: Insufficient documentation

## 2015-11-02 DIAGNOSIS — R112 Nausea with vomiting, unspecified: Secondary | ICD-10-CM | POA: Insufficient documentation

## 2015-11-02 DIAGNOSIS — Z794 Long term (current) use of insulin: Secondary | ICD-10-CM | POA: Insufficient documentation

## 2015-11-02 DIAGNOSIS — L83 Acanthosis nigricans: Secondary | ICD-10-CM | POA: Insufficient documentation

## 2015-11-02 DIAGNOSIS — R197 Diarrhea, unspecified: Secondary | ICD-10-CM | POA: Insufficient documentation

## 2015-11-02 DIAGNOSIS — Z7984 Long term (current) use of oral hypoglycemic drugs: Secondary | ICD-10-CM | POA: Diagnosis not present

## 2015-11-02 DIAGNOSIS — E1310 Other specified diabetes mellitus with ketoacidosis without coma: Secondary | ICD-10-CM | POA: Diagnosis not present

## 2015-11-02 DIAGNOSIS — E669 Obesity, unspecified: Secondary | ICD-10-CM | POA: Diagnosis not present

## 2015-11-02 DIAGNOSIS — E119 Type 2 diabetes mellitus without complications: Secondary | ICD-10-CM

## 2015-11-02 DIAGNOSIS — N179 Acute kidney failure, unspecified: Secondary | ICD-10-CM | POA: Diagnosis not present

## 2015-11-02 DIAGNOSIS — Z23 Encounter for immunization: Secondary | ICD-10-CM | POA: Insufficient documentation

## 2015-11-02 DIAGNOSIS — Z6831 Body mass index (BMI) 31.0-31.9, adult: Secondary | ICD-10-CM

## 2015-11-02 DIAGNOSIS — E1165 Type 2 diabetes mellitus with hyperglycemia: Secondary | ICD-10-CM | POA: Diagnosis present

## 2015-11-02 DIAGNOSIS — R739 Hyperglycemia, unspecified: Secondary | ICD-10-CM

## 2015-11-02 HISTORY — DX: Type 2 diabetes mellitus with ketoacidosis without coma: E11.10

## 2015-11-02 HISTORY — DX: Type 2 diabetes mellitus without complications: E11.9

## 2015-11-02 LAB — POCT URINALYSIS DIPSTICK
GLUCOSE UA: 500
Ketones, UA: >=160
Leukocytes, UA: NEGATIVE
NITRITE UA: NEGATIVE
Protein, UA: 100
Spec Grav, UA: 1.015
Urobilinogen, UA: 0.2
pH, UA: 5.5

## 2015-11-02 LAB — URINE MICROSCOPIC-ADD ON

## 2015-11-02 LAB — BASIC METABOLIC PANEL
ANION GAP: 9 (ref 5–15)
Anion gap: 9 (ref 5–15)
BUN: 6 mg/dL (ref 6–20)
BUN: 6 mg/dL (ref 6–20)
CALCIUM: 9.4 mg/dL (ref 8.9–10.3)
CHLORIDE: 110 mmol/L (ref 101–111)
CHLORIDE: 112 mmol/L — AB (ref 101–111)
CO2: 19 mmol/L — AB (ref 22–32)
CO2: 18 mmol/L — ABNORMAL LOW (ref 22–32)
CREATININE: 0.88 mg/dL (ref 0.44–1.00)
Calcium: 9.4 mg/dL (ref 8.9–10.3)
Creatinine, Ser: 0.99 mg/dL (ref 0.44–1.00)
GFR calc Af Amer: >60 mL/min (ref >60)
GFR calc Af Amer: >60 mL/min (ref >60)
GFR calc non Af Amer: >60 mL/min (ref >60)
GLUCOSE: 245 mg/dL — AB (ref 65–99)
Glucose, Bld: 192 mg/dL — ABNORMAL HIGH (ref 65–99)
POTASSIUM: 3.9 mmol/L (ref 3.5–5.1)
Potassium: 3.6 mmol/L (ref 3.5–5.1)
SODIUM: 139 mmol/L (ref 135–145)
Sodium: 138 mmol/L (ref 135–145)

## 2015-11-02 LAB — GLUCOSE, POCT (MANUAL RESULT ENTRY): POC Glucose: 426 mg/dl — AB (ref 70–99)

## 2015-11-02 LAB — URINALYSIS, ROUTINE W REFLEX MICROSCOPIC
Bilirubin Urine: NEGATIVE
Glucose, UA: >1000 mg/dL — AB
Ketones, ur: >80 mg/dL — AB
NITRITE: NEGATIVE
PROTEIN: 30 mg/dL — AB
Specific Gravity, Urine: 1.035 — ABNORMAL HIGH (ref 1.005–1.030)
pH: 5 (ref 5.0–8.0)

## 2015-11-02 LAB — COMPREHENSIVE METABOLIC PANEL
ALT: 36 U/L (ref 14–54)
AST: 24 U/L (ref 15–41)
Albumin: 4.5 g/dL (ref 3.5–5.0)
Alkaline Phosphatase: 60 U/L (ref 38–126)
Anion gap: 16 — ABNORMAL HIGH (ref 5–15)
BUN: 7 mg/dL (ref 6–20)
CHLORIDE: 102 mmol/L (ref 101–111)
CO2: 15 mmol/L — ABNORMAL LOW (ref 22–32)
Calcium: 9.8 mg/dL (ref 8.9–10.3)
Creatinine, Ser: 1.3 mg/dL — ABNORMAL HIGH (ref 0.44–1.00)
GFR calc Af Amer: >60 mL/min (ref >60)
GFR, EST NON AFRICAN AMERICAN: 56 mL/min — AB (ref >60)
Glucose, Bld: 478 mg/dL — ABNORMAL HIGH (ref 65–99)
POTASSIUM: 4.4 mmol/L (ref 3.5–5.1)
Sodium: 133 mmol/L — ABNORMAL LOW (ref 135–145)
Total Bilirubin: 1.3 mg/dL — ABNORMAL HIGH (ref 0.3–1.2)
Total Protein: 8.6 g/dL — ABNORMAL HIGH (ref 6.5–8.1)

## 2015-11-02 LAB — GLUCOSE, CAPILLARY
GLUCOSE-CAPILLARY: 198 mg/dL — AB (ref 65–99)
GLUCOSE-CAPILLARY: 164 mg/dL — AB (ref 65–99)
GLUCOSE-CAPILLARY: 266 mg/dL — AB (ref 65–99)
Glucose-Capillary: 265 mg/dL — ABNORMAL HIGH (ref 65–99)
Glucose-Capillary: 157 mg/dL — ABNORMAL HIGH (ref 65–99)
Glucose-Capillary: 260 mg/dL — ABNORMAL HIGH (ref 65–99)

## 2015-11-02 LAB — CBG MONITORING, ED
GLUCOSE-CAPILLARY: 399 mg/dL — AB (ref 65–99)
GLUCOSE-CAPILLARY: 316 mg/dL — AB (ref 65–99)

## 2015-11-02 LAB — CBC
HCT: 40.4 % (ref 36.0–46.0)
Hemoglobin: 13.2 g/dL (ref 12.0–15.0)
MCH: 26.8 pg (ref 26.0–34.0)
MCHC: 32.7 g/dL (ref 30.0–36.0)
MCV: 82.1 fL (ref 78.0–100.0)
PLATELETS: 270 10*3/uL (ref 150–400)
RBC: 4.92 MIL/uL (ref 3.87–5.11)
RDW: 14.1 % (ref 11.5–15.5)
WBC: 5.3 10*3/uL (ref 4.0–10.5)

## 2015-11-02 MED ORDER — SODIUM CHLORIDE 0.9 % IV SOLN
INTRAVENOUS | Status: DC
  Administered 2015-11-02: 17:00:00 via INTRAVENOUS

## 2015-11-02 MED ORDER — ONDANSETRON HCL 4 MG/2ML IJ SOLN
4.0000 mg | Freq: Once | INTRAMUSCULAR | Status: AC
  Administered 2015-11-02: 4 mg via INTRAVENOUS
  Filled 2015-11-02: qty 2

## 2015-11-02 MED ORDER — INSULIN ASPART 100 UNIT/ML ~~LOC~~ SOLN
20.0000 [IU] | Freq: Once | SUBCUTANEOUS | Status: AC
Start: 2015-11-02 — End: 2015-11-02
  Administered 2015-11-02: 20 [IU] via SUBCUTANEOUS

## 2015-11-02 MED ORDER — LIVING WELL WITH DIABETES BOOK
Freq: Once | Status: AC
  Administered 2015-11-02: 1
  Filled 2015-11-02: qty 1

## 2015-11-02 MED ORDER — SODIUM CHLORIDE 0.9 % IV SOLN
INTRAVENOUS | Status: DC
  Administered 2015-11-02: 1.9 [IU]/h via INTRAVENOUS
  Filled 2015-11-02: qty 2.5

## 2015-11-02 MED ORDER — POTASSIUM CHLORIDE CRYS ER 20 MEQ PO TBCR
40.0000 meq | EXTENDED_RELEASE_TABLET | Freq: Once | ORAL | Status: AC
  Administered 2015-11-02: 40 meq via ORAL
  Filled 2015-11-02: qty 2

## 2015-11-02 MED ORDER — POLYETHYLENE GLYCOL 3350 17 G PO PACK: 17.0000 g | PACK | Freq: Every day | ORAL | Status: DC | PRN

## 2015-11-02 MED ORDER — INSULIN ASPART 100 UNIT/ML IV SOLN: 5.0000 [IU] | Freq: Once | INTRAVENOUS | Status: DC

## 2015-11-02 MED ORDER — INSULIN ASPART 100 UNIT/ML ~~LOC~~ SOLN
0.0000 [IU] | Freq: Three times a day (TID) | SUBCUTANEOUS | Status: DC
  Administered 2015-11-03: 5 [IU] via SUBCUTANEOUS
  Administered 2015-11-03: 9 [IU] via SUBCUTANEOUS

## 2015-11-02 MED ORDER — ENOXAPARIN SODIUM 40 MG/0.4ML ~~LOC~~ SOLN
40.0000 mg | SUBCUTANEOUS | Status: DC
  Administered 2015-11-02: 40 mg via SUBCUTANEOUS
  Filled 2015-11-02: qty 0.4

## 2015-11-02 MED ORDER — PNEUMOCOCCAL VAC POLYVALENT 25 MCG/0.5ML IJ INJ
0.5000 mL | INJECTION | INTRAMUSCULAR | Status: AC
  Administered 2015-11-03: 0.5 mL via INTRAMUSCULAR
  Filled 2015-11-02: qty 0.5

## 2015-11-02 MED ORDER — INSULIN GLARGINE 100 UNIT/ML SOLOSTAR PEN: 15.0000 [IU] | PEN_INJECTOR | Freq: Every day | SUBCUTANEOUS | Status: DC

## 2015-11-02 MED ORDER — DEXTROSE-NACL 5-0.45 % IV SOLN
INTRAVENOUS | Status: DC
  Administered 2015-11-02: 20:00:00 via INTRAVENOUS

## 2015-11-02 MED ORDER — SODIUM CHLORIDE 0.9 % IV BOLUS (SEPSIS)
1000.0000 mL | Freq: Once | INTRAVENOUS | Status: AC
  Administered 2015-11-02: 1000 mL via INTRAVENOUS

## 2015-11-02 MED ORDER — ONDANSETRON HCL 4 MG/2ML IJ SOLN: 4.0000 mg | Freq: Four times a day (QID) | INTRAMUSCULAR | Status: DC | PRN

## 2015-11-02 MED ORDER — INSULIN GLARGINE 100 UNIT/ML ~~LOC~~ SOLN
25.0000 [IU] | SUBCUTANEOUS | Status: DC
  Administered 2015-11-02: 25 [IU] via SUBCUTANEOUS
  Filled 2015-11-02 (×2): qty 0.25

## 2015-11-02 MED ORDER — INSULIN ASPART 100 UNIT/ML ~~LOC~~ SOLN: 5.0000 [IU] | Freq: Once | SUBCUTANEOUS | Status: DC

## 2015-11-02 NOTE — H&P (Signed)
Date: 11/02/2015               Patient Name:  Angela Henderson MRN: [id number]  DOB: 07/16/1991 Age / Sex: 24 y.o., female   PCP: Ambrose Finland, NP         Medical Service: Internal Medicine Teaching Service         Attending Physician: Dr. Doneen Poisson, MD    First Contact: Dr. Karma Greaser Pager: [phone]  Second Contact: Dr. Isabella Bowens Pager: [phone]       After Hours (After 5p/  First Contact Pager: [phone]  weekends / holidays): Second Contact Pager: [phone]   Chief Complaint: Hyperglycemia  History of Present Illness: Angela Henderson is a pleasant 24 year old female who has recently been diagnosed with type 2 diabetes presenting [facility] after seeing her PCP this morning and found to have ketonuria and N/V. In the ED she was found to have mild acidosis with bicarb of 15, anion gap of 16 and glucose of 478. Her only home medications are Metformin 500 mg bid. She was given a IVF and insulin. She states she has had progressively worse PO intake over the past week and has not been drinking many fluids. Reports dizziness when standing. Reports little appetite and nausea. She reports the nausea has been persistent over the past month with occasional emesis. Does report one episode of emesis this morning with thick yellow mucus. Denies any hematemesis. Does report diarrhea that has been ongoing since she started the metformin but denies any hematochezia or melena. Denies any headaches, vision changes, chest pain, palpitations, shortness of breath or abdominal pain. Does note increased urinary frequency but denies any dysuria or hematuria.   Meds: Current Facility-Administered Medications  Medication Dose Route Frequency Provider Last Rate Last Dose  . insulin aspart (novoLOG) injection 5 Units  5 Units Intravenous Once [place], PA-C   5 Units at 11/02/15 1519   Current Outpatient Prescriptions  Medication Sig Dispense Refill  . ibuprofen (ADVIL,MOTRIN) 800 MG tablet Take 1 tablet (800 mg  total) by mouth 3 (three) times daily. 21 tablet 0  . Insulin Glargine (LANTUS SOLOSTAR) 100 UNIT/ML Solostar Pen Inject 15 Units into the skin daily at 10 pm. 5 pen 3  . metFORMIN (GLUCOPHAGE) 500 MG tablet Take 500 mg by mouth 2 (two) times daily.    . ondansetron (ZOFRAN ODT) 4 MG disintegrating tablet Take 1 tablet (4 mg total) by mouth every 8 (eight) hours as needed for nausea. 10 tablet 0    Allergies: Allergies as of 11/02/2015  . (No Known Allergies)   Past Medical History  Diagnosis Date  . Diabetes mellitus without complication [facility])    Past Surgical History  Procedure Laterality Date  . No past surgeries     Family History  Problem Relation Age of Onset  . Diabetes Mother   . Diabetes Father    Social History   Social History  . Marital Status: Single    Spouse Name: N/A  . Number of Children: N/A  . Years of Education: N/A   Occupational History  . Not on file.   Social History Main Topics  . Smoking status: Never Smoker   . Smokeless tobacco: Not on file  . Alcohol Use: 0.0 oz/week    0 Standard drinks or equivalent per week     Comment: 1-2 drinks on weekends  . Drug Use: No  . Sexual Activity: Not on file   Other Topics Concern  .  Not on file   Social History Narrative   Review of Systems: Review of Systems  Constitutional: Negative for fever, chills and diaphoresis.  Eyes: Negative for blurred vision and double vision.  Respiratory: Negative for shortness of breath.   Cardiovascular: Negative for chest pain and palpitations.  Gastrointestinal: Positive for nausea, vomiting and diarrhea. Negative for abdominal pain, blood in stool and melena.  Genitourinary: Positive for frequency. Negative for dysuria, urgency, hematuria and flank pain.  Musculoskeletal: Negative for myalgias.  Skin: Negative for rash.  Neurological: Positive for dizziness. Negative for loss of consciousness, weakness and headaches.  Endo/Heme/Allergies: Negative for  polydipsia.   Physical Exam: Blood pressure 118/72, pulse 83, temperature 97.5 F (36.4 C), temperature source Oral, resp. rate 18, height 5\' 6"  (1.676 m), weight 197 lb (89.359 kg), last menstrual period 10/12/2015, SpO2 100 %.  Physical Exam General: alert, well-developed, and cooperative to examination.  Head: normocephalic and atraumatic.  Eyes: vision grossly intact, pupils equal, pupils round, pupils reactive to light, no injection and anicteric.  Mouth: pharynx pink and dry, no erythema, and no exudates.  Neck: supple, full ROM, no thyromegaly, no JVD, and no carotid bruits.  Lungs: normal respiratory effort, no accessory muscle use, normal breath sounds, no crackles, and no wheezes. Heart: normal rate, regular rhythm, no murmur, no gallop, and no rub.  Abdomen: soft, non-tender, normal bowel sounds, no distention, no guarding, no rebound tenderness Msk: no joint swelling, no joint warmth, and no redness over joints.  Pulses: 2+ DP/PT pulses bilaterally Extremities: No cyanosis, clubbing or edema Neurologic: alert & oriented X3, cranial nerves II-XII intact, strength normal in all extremities, sensation intact to light touch Skin: warm, dry, no rahses Psych: normal mood and affect   Lab results: Basic Metabolic Panel:  Recent Labs  16/08/9611/16/16 1345  NA 133*  K 4.4  CL 102  CO2 15*  GLUCOSE 478*  BUN 7  CREATININE 1.30*  CALCIUM 9.8   Liver Function Tests:  Recent Labs  11/02/15 1345  AST 24  ALT 36  ALKPHOS 60  BILITOT 1.3*  PROT 8.6*  ALBUMIN 4.5   CBC:  Recent Labs  11/02/15 1345  WBC 5.3  HGB 13.2  HCT 40.4  MCV 82.1  PLT 270   CBG:  Recent Labs  11/02/15 1423 11/02/15 1516  GLUCAP 399* 316*   Urinalysis:  Recent Labs  11/02/15 1247  BILIRUBINUR small  PROTEINUR 100  UROBILINOGEN 0.2  NITRITE neg  LEUKOCYTESUR Negative   Imaging results:  No results found.  Assessment & Plan by Problem: Angela Henderson is a 24 year old female  with recently diagnosed type II DM presenting with DKA and associated N/V.  Diabetic Ketoacidosis: Patient with AG 16 and bicarb of 15. Denies any chest pain, shortness of breath, headaches. No recent illness. She has had poor PO intake over the past week. Only on metformin 500 mg bid at home. Poor control of her sugars since diagnosed 1 month ago, usually range 300-400. Does report nausea and vomiting.  -Insulin ggt, will transition to Lantus 25 units once gap closes -NPO, advance diet once gap closes -Received 1L bolus in the ED. Will give NS 125 mL/hr and change to D5 1/2 NS once CBG <250 -Zofran prn -BMP q4hr -A1c  AKI: Cr. 1.30 on admission. Baseline appears to be around 0.75-0.8 on chart review. Likely due to dehydration from poor PO intake.  -IVF and labs per above  FEN: NPO  DVT PPx: Lovenox  Dispo: Disposition is deferred at this time, awaiting improvement of current medical problems. Anticipated discharge in approximately 1-2 day(s).   The patient does have a current PCP Ambrose Finland, NP) and does need an [facility] hospital follow-up appointment after discharge.  The patient does not have transportation limitations that hinder transportation to clinic appointments.  Signed: Valentino Nose, MD 11/02/2015, 4:10 PM

## 2015-11-02 NOTE — ED Provider Notes (Signed)
CSN: [id number]     Arrival date & time 11/02/15  1312 History   First MD Initiated Contact with Patient 11/02/15 1358     Chief Complaint  Patient presents with  . Hyperglycemia     (Consider location/radiation/quality/duration/timing/severity/associated sxs/prior Treatment) The history is provided by the patient and medical records.     Pt recently diagnosed with diabetes mellitus sent from PCP with ketonuria and N/V that began this morning.  Was seen at PCP today to establish care.  Was diagnosed with diabetes 1-2 months ago and has been on Metformin 500mg  BID.  Has "tried" to change her diet but isn't sure what rules to follow - last night ate corn, hot dogs with bread.  Denies fevers, URI symptoms, SOB, cough, CP, abdominal pain, urinary, vaginal, or bowel symptoms.  Has had urinary frequency with her high blood sugar.  She has also had diarrhea since starting the metformin. CBG 426 at PCP.    Past Medical History  Diagnosis Date  . Diabetes mellitus without complication (HCC)    History reviewed. No pertinent past surgical history. No family history on file. Social History  Substance Use Topics  . Smoking status: Never Smoker   . Smokeless tobacco: None  . Alcohol Use: Yes     Comment: weekends   OB History    No data available     Review of Systems  All other systems reviewed and are negative.     Allergies  Review of patient's allergies indicates no known allergies.  Home Medications   Prior to Admission medications   Medication Sig Start Date End Date Taking? Authorizing Provider  ibuprofen (ADVIL,MOTRIN) 800 MG tablet Take 1 tablet (800 mg total) by mouth 3 (three) times daily. 12/16/14   Renne CriglerJoshua Geiple, PA-C  Insulin Glargine (LANTUS SOLOSTAR) 100 UNIT/ML Solostar Pen Inject 15 Units into the skin daily at 10 pm. 11/02/15   Ambrose FinlandValerie A Keck, NP  metFORMIN (GLUCOPHAGE) 500 MG tablet Take 500 mg by mouth 2 (two) times daily.    Historical Provider, MD   methocarbamol (ROBAXIN) 500 MG tablet Take 2 tablets (1,000 mg total) by mouth 4 (four) times daily. Patient not taking: Reported on 10/17/2015 12/16/14   Renne CriglerJoshua Geiple, PA-C  ondansetron (ZOFRAN ODT) 4 MG disintegrating tablet Take 1 tablet (4 mg total) by mouth every 8 (eight) hours as needed for nausea. 10/17/15   Eber HongBrian Miller, MD  phenazopyridine (PYRIDIUM) 95 MG tablet Take 1 tablet (95 mg total) by mouth 3 (three) times daily as needed for pain. Patient not taking: Reported on 10/17/2015 08/31/14   Francee PiccoloJennifer Piepenbrink, PA-C   BP 129/89 mmHg  Pulse 95  Temp(Src) 97.5 F (36.4 C) (Oral)  Resp 18  Ht 5\' 6"  (1.676 m)  Wt 89.359 kg  BMI 31.81 kg/m2  SpO2 100%  LMP 10/12/2015 (Exact Date) Physical Exam  Constitutional: She appears well-developed and well-nourished. No distress.  HENT:  Head: Normocephalic and atraumatic.  Neck: Neck supple.  Cardiovascular: Normal rate and regular rhythm.   Pulmonary/Chest: Effort normal and breath sounds normal. No respiratory distress. She has no wheezes. She has no rales.  Abdominal: Soft. She exhibits no distension. There is no tenderness. There is no rebound and no guarding.  Neurological: She is alert.  Skin: She is not diaphoretic.  Nursing note and vitals reviewed.   ED Course  Procedures (including critical care time) Labs Review Labs Reviewed  COMPREHENSIVE METABOLIC PANEL - Abnormal; Notable for the following:    Sodium 133 (*)  CO2 15 (*)    Glucose, Bld 478 (*)    Creatinine, Ser 1.30 (*)    Total Protein 8.6 (*)    Total Bilirubin 1.3 (*)    GFR calc non Af Amer 56 (*)    Anion gap 16 (*)    All other components within normal limits  CBG MONITORING, ED - Abnormal; Notable for the following:    Glucose-Capillary 399 (*)    All other components within normal limits  CBG MONITORING, ED - Abnormal; Notable for the following:    Glucose-Capillary 316 (*)    All other components within normal limits  CBC  URINALYSIS,  ROUTINE W REFLEX MICROSCOPIC (NOT AT [facility])  BLOOD GAS, ARTERIAL  POC URINE PREG, ED    Imaging Review No results found. I have personally reviewed and evaluated these images and lab results as part of my medical decision-making.   EKG Interpretation None      MDM   Final diagnoses:  Hyperglycemia  Diabetic ketoacidosis without coma associated with type 2 diabetes mellitus (HCC)   Afebrile nontoxic patient recently diagnosed with DM on Metformin  BID with poor dietary control sent by PCP with ketonuria, also c/o N/V that began this morning.   Labs significant for mild acidosis, CO2 15, anion gap is 16, hyperglycemic at 478.  IVF , insulin given.  Pt to start Lantus tonight per PCP orders.  Admitted as unassigned admission to Internal Medicine Teaching Service for observation.      Trixie Dredge, PA-C 11/02/15 1529  Loren Racer, MD 11/06/15 [phone]

## 2015-11-02 NOTE — Progress Notes (Signed)
Pt. Arrived and is oriented to the unit. Pt. Is stable and IV fluids started.

## 2015-11-02 NOTE — Progress Notes (Signed)
Patient ID: Angela Henderson, female   DOB: 27-Aug-1991, 24 y.o.   MRN: [id number]  WUJ:[id number]  [id number]  DOB - Dec 01, 1990  CC:  Chief Complaint  Patient presents with  . Hospitalization Follow-up       HPI: Angela Henderson is a 24 y.o. female here today to establish medical care. Patient reports that she was recently diagnosed with diabetes 2 months ago and was started on Metformin 500 mg BID. Although she reports medication compliance she notes that her blood sugars are usually 300-400. She states that she is unsure what type she is because no testing has been performed. She notes that her mother was a type 1 and passed away from diabetic complications. Her father is a type 2 diabetic and is alive and well. Patient reports that she constantly feels bad and has been having nausea and vomiting. Today her sugar is elevated and she has ketonuria.  No Known Allergies Past Medical History  Diagnosis Date  . Diabetes mellitus without complication [facility])    Current Outpatient Prescriptions on File Prior to Visit  Medication Sig Dispense Refill  . ibuprofen (ADVIL,MOTRIN) 800 MG tablet Take 1 tablet (800 mg total) by mouth 3 (three) times daily. 21 tablet 0  . metFORMIN (GLUCOPHAGE) 500 MG tablet Take 500 mg by mouth 2 (two) times daily.    . ondansetron (ZOFRAN ODT) 4 MG disintegrating tablet Take 1 tablet (4 mg total) by mouth every 8 (eight) hours as needed for nausea. 10 tablet 0  . methocarbamol (ROBAXIN) 500 MG tablet Take 2 tablets (1,000 mg total) by mouth 4 (four) times daily. (Patient not taking: Reported on 10/17/2015) 30 tablet 0  . phenazopyridine (PYRIDIUM) 95 MG tablet Take 1 tablet (95 mg total) by mouth 3 (three) times daily as needed for pain. (Patient not taking: Reported on 10/17/2015) 10 tablet 0   No current facility-administered medications on file prior to visit.   History reviewed. No pertinent family history. Social History   Social History  . Marital  Status: Single    Spouse Name: N/A  . Number of Children: N/A  . Years of Education: N/A   Occupational History  . Not on file.   Social History Main Topics  . Smoking status: Never Smoker   . Smokeless tobacco: Not on file  . Alcohol Use: Yes     Comment: weekends  . Drug Use: Not on file  . Sexual Activity: Not on file   Other Topics Concern  . Not on file   Social History Narrative    Review of Systems: Other than what is stated in HPI, all other systems are negative.   Objective:   Filed Vitals:   11/02/15 1215  BP: 136/84  Pulse: 85  Temp: 98.2 F (36.8 C)  Resp: 18    Physical Exam  Constitutional: She is oriented to person, place, and time.  Appears weak  Cardiovascular: Normal rate, regular rhythm and normal heart sounds.   Pulmonary/Chest: Effort normal and breath sounds normal.  Neurological: She is oriented to person, place, and time.    Lab Results  Component Value Date   WBC 5.7 10/19/2015   HGB 12.2 10/19/2015   HCT 37.5 10/19/2015   MCV 82.4 10/19/2015   PLT 300 10/19/2015   Lab Results  Component Value Date   CREATININE 0.80 10/19/2015   BUN <5* 10/19/2015   NA 134* 10/19/2015   K 4.0 10/19/2015   CL 102 10/19/2015   CO2 21* 10/19/2015  No results found for: HGBA1C Lipid Panel     Component Value Date/Time   CHOL 153 07/03/2010 1747   TRIG 53 07/03/2010 1747   HDL 36* 07/03/2010 1747   CHOLHDL 4.3 Ratio 07/03/2010 1747   VLDL 11 07/03/2010 1747   LDLCALC 106* 07/03/2010 1747       Assessment and plan:   Angela Henderson was seen today for hospitalization follow-up.  Diagnoses and all orders for this visit:  Diabetic ketoacidosis without coma associated with type 1 diabetes mellitus (HCC) -     Glucose (CBG) -     POCT urinalysis dipstick -     insulin aspart (novoLOG) injection 20 Units; Inject 0.2 mLs (20 Units total) into the skin once in office. -     Begin Insulin Glargine (LANTUS SOLOSTAR) 100 UNIT/ML Solostar Pen;  Inject 15 Units into the skin daily at 10 pm. -     Ambulatory referral to Endocrinology -     Amb Referral to Nutrition and Diabetic E Urine reveals that she has >160 of ketones in urine and she is nauseous. She will be sent to the ER for further evaluation. It is in my opinion that patient is in DKA. I will start her on 15 units of Lantus for when she is discharged from hospital. She will follow up with me next week and we will begin to adjust her insulin accordingly. I have also referred patient to Endocrinology. She has been advised to call office if she needs assistance before discharge. She has also been instructed on how to use insulin before transferred to ER.   Return for WEdnesday morning .   Ambrose FinlandValerie A Everest Brod, NP-C [facility] [phone] 11/02/2015, 12:29 PM

## 2015-11-02 NOTE — Progress Notes (Signed)
Patient here for HFU for DM  Patient complains of being nauseated and vomiting thick yellow secretions this morning. Patient states nothing makes the symptom worse or better.  Patient denies pain at this time.  Patient was sent to ER for further Evaluation.

## 2015-11-02 NOTE — ED Notes (Signed)
Sent from dr's office with high ketones and hyperglycemia -- newly diagnosed diabetic-- last week.

## 2015-11-02 NOTE — Telephone Encounter (Signed)
Patient advised to keep a log of blood sugars between now and appointment Wednesday 11/07/15.

## 2015-11-03 DIAGNOSIS — E1310 Other specified diabetes mellitus with ketoacidosis without coma: Secondary | ICD-10-CM | POA: Diagnosis not present

## 2015-11-03 DIAGNOSIS — N179 Acute kidney failure, unspecified: Secondary | ICD-10-CM | POA: Diagnosis not present

## 2015-11-03 LAB — BASIC METABOLIC PANEL
ANION GAP: 9 (ref 5–15)
ANION GAP: 10 (ref 5–15)
ANION GAP: 9 (ref 5–15)
BUN: 6 mg/dL (ref 6–20)
BUN: <5 mg/dL — ABNORMAL LOW (ref 6–20)
CHLORIDE: 107 mmol/L (ref 101–111)
CHLORIDE: 108 mmol/L (ref 101–111)
CHLORIDE: 108 mmol/L (ref 101–111)
CO2: 16 mmol/L — ABNORMAL LOW (ref 22–32)
CO2: 16 mmol/L — ABNORMAL LOW (ref 22–32)
CO2: 19 mmol/L — AB (ref 22–32)
Calcium: 8.8 mg/dL — ABNORMAL LOW (ref 8.9–10.3)
Calcium: 9.1 mg/dL (ref 8.9–10.3)
Calcium: 9.2 mg/dL (ref 8.9–10.3)
Creatinine, Ser: 0.96 mg/dL (ref 0.44–1.00)
Creatinine, Ser: 1 mg/dL (ref 0.44–1.00)
Creatinine, Ser: 1.04 mg/dL — ABNORMAL HIGH (ref 0.44–1.00)
GFR calc Af Amer: >60 mL/min (ref >60)
GFR calc Af Amer: >60 mL/min (ref >60)
GFR calc Af Amer: >60 mL/min (ref >60)
GLUCOSE: 363 mg/dL — AB (ref 65–99)
GLUCOSE: 264 mg/dL — AB (ref 65–99)
GLUCOSE: 267 mg/dL — AB (ref 65–99)
POTASSIUM: 4.2 mmol/L (ref 3.5–5.1)
POTASSIUM: 3.8 mmol/L (ref 3.5–5.1)
POTASSIUM: 4.1 mmol/L (ref 3.5–5.1)
Sodium: 132 mmol/L — ABNORMAL LOW (ref 135–145)
Sodium: 134 mmol/L — ABNORMAL LOW (ref 135–145)
Sodium: 136 mmol/L (ref 135–145)

## 2015-11-03 LAB — HEMOGLOBIN A1C
Hgb A1c MFr Bld: 12.5 % — ABNORMAL HIGH (ref 4.8–5.6)
MEAN PLASMA GLUCOSE: 312 mg/dL

## 2015-11-03 LAB — GLUCOSE, CAPILLARY
GLUCOSE-CAPILLARY: 293 mg/dL — AB (ref 65–99)
Glucose-Capillary: 215 mg/dL — ABNORMAL HIGH (ref 65–99)
Glucose-Capillary: 396 mg/dL — ABNORMAL HIGH (ref 65–99)

## 2015-11-03 MED ORDER — GLUCOSE BLOOD VI STRP: ORAL_STRIP | Status: DC

## 2015-11-03 MED ORDER — INSULIN STARTER KIT- SYRINGES (ENGLISH)
1.0000 | Freq: Once | Status: DC
  Filled 2015-11-03: qty 1

## 2015-11-03 MED ORDER — INSULIN SYRINGE-NEEDLE U-100 31G X 1/4" 0.3 ML MISC
1.0000 | Freq: Every day | Status: DC
Start: 2015-11-03 — End: 2017-04-17

## 2015-11-03 MED ORDER — INSULIN STARTER KIT- SYRINGES (ENGLISH): 1.0000 | Freq: Once | Status: DC

## 2015-11-03 MED ORDER — INSULIN GLARGINE 100 UNIT/ML SOLOSTAR PEN: 25.0000 [IU] | PEN_INJECTOR | Freq: Every day | SUBCUTANEOUS | Status: DC

## 2015-11-03 MED ORDER — INSULIN GLARGINE 100 UNIT/ML ~~LOC~~ SOLN: 25.0000 [IU] | Freq: Every day | SUBCUTANEOUS | Status: DC

## 2015-11-03 MED ORDER — LIVING WELL WITH DIABETES BOOK
Freq: Once | Status: DC
  Filled 2015-11-03: qty 1

## 2015-11-03 NOTE — Progress Notes (Signed)
   Subjective: Patient with no complaints this morning. Reports she is feeling much better. Tolerated breakfast this morning. No N/V or abdominal pain.   Objective: Vital signs in last 24 hours: Filed Vitals:   11/02/15 1500 11/02/15 1545 11/02/15 2133 11/03/15 0524  BP: 118/72 100/70 117/79 112/74  Pulse: 83 81 83 62  Temp:   98.1 F (36.7 C) 98.2 F (36.8 C)  TempSrc:   Oral Oral  Resp:  18 20 20   Height:      Weight:      SpO2: 100% 100% 100% 100%   Weight change:   Intake/Output Summary (Last 24 hours) at 11/03/15 1120 Last data filed at 11/03/15 [id number]  Gross per 24 hour  Intake 663.93 ml  Output    700 ml  Net -36.07 ml    PHYSICAL EXAM GENERAL- alert, co-operative, appears as stated age, not in any distress. HEENT- Atraumatic, normocephalic, PERRL, EOMI, oral mucosa appears moist CARDIAC- RRR, no murmurs, rubs or gallops. RESP- Moving equal volumes of air, and clear to auscultation bilaterally, no wheezes or crackles. ABDOMEN- Soft, nontender, bowel sounds present. NEURO- No obvious Cr N abnormality. EXTREMITIES- pulse 2+, symmetric, no pedal edema. SKIN- Warm, dry. Acanthosis nigricans in posterior neck and axillia PSYCH- Normal mood and affect, appropriate thought content and speech.  Medications: I have reviewed the patient's current medications. Scheduled Meds: . enoxaparin (LOVENOX) injection  40 mg Subcutaneous Q24H  . insulin aspart  0-9 Units Subcutaneous TID WC  . insulin glargine  25 Units Subcutaneous Q24H   Continuous Infusions: . sodium chloride Stopped (11/02/15 2355)  . dextrose 5 % and 0.45% NaCl Stopped (11/02/15 2355)  . insulin (NOVOLIN-R) infusion Stopped (11/02/15 2355)   PRN Meds:.ondansetron (ZOFRAN) IV, polyethylene glycol Assessment/Plan:  DKA: Patient's gap closed overnight with insulin drip and IVF. She was transitioned off insulin ggt to Lantus 25 units. She tolerated PO intake with no N/V or abdominal pain this morning. CBGs  217-267 this morning.  -Will check repeat BMP this afternoon -D5 1/2NS 125 mL/hr -DM educator -Lantus 25 units qhs  Diabetes: She has been diagnosed with Type 2 DM and was started on Metformin a month ago. She has a strong family history of both type 1 and type 2 diabetes. She does have a BMI of 31 with acanthosis nigricans on exam suggestive of type 2. Will check anti-GAD and insulin AB to further delineate the diagnosis. Will continue Lantus 25 units qhs and metformin 500 mg bid at discharge.   AKI: Resolved. Cr 1.0 this morning. Likely 2/2 dehydration. U/A was dirty but high number of squam cells present, most likely a dirty catch. She denies any urinary symptoms.   DVT PPx: Lovenox  Dispo: Anticipate discharge later this afternon  The patient does have a current PCP Ambrose Finland(Valerie A Keck, NP) and [facility] after discharge.  The patient does not have transportation limitations that hinder transportation to clinic appointments.  .Services Needed at time of discharge: Y = Yes, Blank = No PT:   OT:   RN:   Equipment:   Other:       Valentino NoseNathan Ken Bonn, MD IMTS PGY-1 [phone] 11/03/2015, 11:20 AM

## 2015-11-03 NOTE — Progress Notes (Signed)
Initial Nutrition Assessment  DOCUMENTATION CODES:   Obesity unspecified  INTERVENTION:   DM education provided, see care plan and education note.   NUTRITION DIAGNOSIS:   Food and nutrition related knowledge deficit related to limited prior education as evidenced by  (Newly dx DM).  GOAL:   Patient will meet greater than or equal to 90% of their needs  MONITOR:   PO intake, Weight trends, Labs  REASON FOR ASSESSMENT:   Malnutrition Screening Tool, Consult Diet education  ASSESSMENT:   [name] is a 24 year old female with recently diagnosed type II DM presenting with DKA and associated N/V.  Labs reviewed: sodium low (134) CBG's: 215-266 Lab Results  Component Value Date   HGBA1C 12.5* 11/02/2015   Pt reports weight loss PTA due to N/V which has now resolved and was possibly from uncontrolled DM. Pt reports no prior education for DM. Appetite is now good.  See care plan for education note.   Diet Order:  Diet Carb Modified Fluid consistency:: Thin; Room service appropriate?: Yes Diet - low sodium heart healthy  Skin:  Reviewed, no issues  Last BM:  12/15  Height:   Ht Readings from Last 1 Encounters:  11/02/15 5\' 6"  (1.676 m)   Weight:   Wt Readings from Last 1 Encounters:  11/02/15 197 lb (89.359 kg)   BMI:  Body mass index is 31.81 kg/(m^2).  Estimated Nutritional Needs:   Kcal:  1700-1900  Protein:  85-95 grams  Fluid:  > 2 L/day  EDUCATION NEEDS:   Education needs addressed  Kendell BaneHeather Tarahji Henderson RD, LDN, CNSC [phone] Pager [phone] After Hours Pager

## 2015-11-03 NOTE — Discharge Instructions (Signed)
[name] you were admitted because you were in what we call Diabetic Ketoacidosis. We would like you to start taking Lantus 25 units every night before bedtime. Continue taking the metformin as before.   I would like to have you follow up in our clinic downstairs. Our office should call you on Monday to schedule a follow up appointment.   Diabetic Ketoacidosis Diabetic ketoacidosis is a life-threatening complication of diabetes. If it is not treated, it can cause severe dehydration and organ damage and can lead to a coma or death. CAUSES This condition develops when there is not enough of the hormone insulin in the body. Insulin helps the body to break down sugar for energy. Without insulin, the body cannot break down sugar, so it breaks down fats instead. This leads to the production of acids that are called ketones. Ketones are poisonous at high levels. This condition can be triggered by:  Stress on the body that is brought on by an illness.  Medicines that raise blood glucose levels.  Not taking diabetes medicine. SYMPTOMS Symptoms of this condition include:  Fatigue.  Weight loss.  Excessive thirst.  Light-headedness.  Fruity or sweet-smelling breath.  Excessive urination.  Vision changes.  Confusion or irritability.  Nausea.  Vomiting.  Rapid breathing.  Abdominal pain.  Feeling flushed. DIAGNOSIS This condition is diagnosed based on a medical history, a physical exam, and blood tests. You may also have a urine test that checks for ketones. TREATMENT This condition may be treated with:  Fluid replacement. This may be done to correct dehydration.  Insulin injections. These may be given through the skin or through an IV tube.  Electrolyte replacement. Electrolytes, such as potassium and sodium, may be given in pill form or through an IV tube.  Antibiotic medicines. These may be prescribed if your condition was caused by an infection. HOME CARE  INSTRUCTIONS Eating and Drinking  Drink enough fluids to keep your urine clear or pale yellow.  If you cannot eat, alternate between drinking fluids with sugar (such as juice) and salty fluids (such as broth or bouillon).  If you can eat, follow your usual diet and drink sugar-free liquids, such as water. Other Instructions  Take insulin as directed by your health care provider. Do not skip insulin injections. Do not use expired insulin.  If your blood sugar is over 240 mg/dL, monitor your urine ketones every 4-6 hours.  If you were prescribed an antibiotic medicine, finish all of it even if you start to feel better.  Rest and exercise only as directed by your health care provider.  If you get sick, call your health care provider and begin treatment quickly. Your body often needs extra insulin to fight an illness.  Check your blood glucose levels regularly. If your blood glucose is high, drink plenty of fluids. This helps to flush out ketones. SEEK MEDICAL CARE IF:  Your blood glucose level is too high or too low.  You have ketones in your urine.  You have a fever.  You cannot eat.  You cannot tolerate fluids.  You have been vomiting for more than 2 hours.  You continue to have symptoms of this condition.  You develop new symptoms. SEEK IMMEDIATE MEDICAL CARE IF:  Your blood glucose levels continue to be high (elevated).  Your monitor reads "high" even when you are taking insulin.  You faint.  You have chest pain.  You have trouble breathing.  You have a sudden, severe headache.  You  have sudden weakness in one arm or one leg.  You have sudden trouble speaking or swallowing.  You have vomiting or diarrhea that gets worse after 3 hours.  You feel severely fatigued.  You have trouble thinking.  You have abdominal pain.  You are severely dehydrated. Symptoms of severe dehydration include:  Extreme thirst.  Dry mouth.  Blue lips.  Cold hands and  feet.  Rapid breathing.   This information is not intended to replace advice given to you by your health care provider. Make sure you discuss any questions you have with your health care provider.   Document Released: 10/31/2000 Document Revised: 03/20/2015 Document Reviewed: 10/11/2014 Elsevier Interactive Patient Education 2016 [employer].  Diabetes Mellitus and Food It is important for you to manage your blood sugar (glucose) level. Your blood glucose level can be greatly affected by what you eat. Eating healthier foods in the appropriate amounts throughout the day at about the same time each day will help you control your blood glucose level. It can also help slow or prevent worsening of your diabetes mellitus. Healthy eating may even help you improve the level of your blood pressure and reach or maintain a healthy weight.  General recommendations for healthful eating and cooking habits include:  Eating meals and snacks regularly. Avoid going long periods of time without eating to lose weight.  Eating a diet that consists mainly of plant-based foods, such as fruits, vegetables, nuts, legumes, and whole grains.  Using low-heat cooking methods, such as baking, instead of high-heat cooking methods, such as deep frying. Work with your dietitian to make sure you understand how to use the Nutrition Facts information on food labels. HOW CAN FOOD AFFECT ME? Carbohydrates Carbohydrates affect your blood glucose level more than any other type of food. Your dietitian will help you determine how many carbohydrates to eat at each meal and teach you how to count carbohydrates. Counting carbohydrates is important to keep your blood glucose at a healthy level, especially if you are using insulin or taking certain medicines for diabetes mellitus. Alcohol Alcohol can cause sudden decreases in blood glucose (hypoglycemia), especially if you use insulin or take certain medicines for diabetes mellitus.  Hypoglycemia can be a life-threatening condition. Symptoms of hypoglycemia (sleepiness, dizziness, and disorientation) are similar to symptoms of having too much alcohol.  If your health care provider has given you approval to drink alcohol, do so in moderation and use the following guidelines:  Women should not have more than one drink per day, and men should not have more than two drinks per day. One drink is equal to:  12 oz of beer.  5 oz of wine.  1 oz of hard liquor.  Do not drink on an empty stomach.  Keep yourself hydrated. Have water, diet soda, or unsweetened iced tea.  Regular soda, juice, and other mixers might contain a lot of carbohydrates and should be counted. WHAT FOODS ARE NOT RECOMMENDED? As you make food choices, it is important to remember that all foods are not the same. Some foods have fewer nutrients per serving than other foods, even though they might have the same number of calories or carbohydrates. It is difficult to get your body what it needs when you eat foods with fewer nutrients. Examples of foods that you should avoid that are high in calories and carbohydrates but low in nutrients include:  Trans fats (most processed foods list trans fats on the Nutrition Facts label).  Regular soda.  Juice.  Candy.  Sweets, such as cake, pie, doughnuts, and cookies.  Fried foods. WHAT FOODS CAN I EAT? Eat nutrient-rich foods, which will nourish your body and keep you healthy. The food you should eat also will depend on several factors, including:  The calories you need.  The medicines you take.  Your weight.  Your blood glucose level.  Your blood pressure level.  Your cholesterol level. You should eat a variety of foods, including:  Protein.  Lean cuts of meat.  Proteins low in saturated fats, such as fish, egg whites, and beans. Avoid processed meats.  Fruits and vegetables.  Fruits and vegetables that may help control blood glucose levels,  such as apples, mangoes, and yams.  Dairy products.  Choose fat-free or low-fat dairy products, such as milk, yogurt, and cheese.  Grains, bread, pasta, and rice.  Choose whole grain products, such as multigrain bread, whole oats, and brown rice. These foods may help control blood pressure.  Fats.  Foods containing healthful fats, such as nuts, avocado, olive oil, canola oil, and fish. DOES EVERYONE WITH DIABETES MELLITUS HAVE THE SAME MEAL PLAN? Because every person with diabetes mellitus is different, there is not one meal plan that works for everyone. It is very important that you meet with a dietitian who will help you create a meal plan that is just right for you.   This information is not intended to replace advice given to you by your health care provider. Make sure you discuss any questions you have with your health care provider.   Document Released: 07/31/2005 Document Revised: 11/24/2014 Document Reviewed: 09/30/2013 Elsevier Interactive Patient Education [employer]

## 2015-11-03 NOTE — Progress Notes (Signed)
Patient discharge teaching given, including activity, diet, follow-up appoints, and medications. Patient verbalized understanding of all discharge instructions. IV access was d/c'd. Vitals are stable. Skin is intact except as charted in most recent assessments. Pt to be escorted out by NT, to be driven home by family. 

## 2015-11-03 NOTE — Discharge Summary (Signed)
Name: Angela Henderson MRN: [id number] DOB: 31-Oct-1991 24 y.o. PCP: Lance Bosch, NP  Date of Admission: 11/02/2015  1:58 PM Date of Discharge: 11/03/2015 Attending Physician: Oval Linsey, MD Discharge Diagnosis: Active Problems:   DKA (diabetic ketoacidoses) ([place])   AKI (acute kidney injury) ([place])  Discharge Medications:   Medication List    STOP taking these medications        Insulin Glargine 100 UNIT/ML Solostar Pen  Commonly known as:  LANTUS SOLOSTAR  Replaced by:  insulin glargine 100 UNIT/ML injection      TAKE these medications        glucose blood test strip  Commonly known as:  ONE TOUCH TEST STRIPS  Use to test blood glucose 3 times a day. Dx code E11.9     insulin glargine 100 UNIT/ML injection  Commonly known as:  LANTUS  Inject 0.25 mLs (25 Units total) into the skin at bedtime.     insulin starter kit- syringes Misc  1 kit by Other route once.     Insulin Syringe-Needle U-100 31G X 1/4" 0.3 ML Misc  1 each by Does not apply route at bedtime. Use to inject insulin daily. Dx code E11.9     metFORMIN 500 MG tablet  Commonly known as:  GLUCOPHAGE  Take 500 mg by mouth 2 (two) times daily.     ondansetron 4 MG disintegrating tablet  Commonly known as:  ZOFRAN ODT  Take 1 tablet (4 mg total) by mouth every 8 (eight) hours as needed for nausea.        Disposition and follow-up:   Ms.Angela Henderson was discharged from [facility] in Good condition.  At the hospital follow up visit please address:  1.  DM: found to be in DKA. Recently diagnosed a month ago only on metformin. Started on lantus 25 units qhs. Please assess patients CBGs and consider adding mealtime insulin.  A1c 12.5.   2.  Labs / imaging needed at time of follow-up: None  3.  Pending labs/ test needing follow-up: anti-GAD and insulin AB   Follow-up Appointments: Follow-up Information    Follow up with [facility].   Why:  We  will call you to scheudle an appointment   Contact information:   [address] [place] [phone]      Discharge Instructions: Discharge Instructions    Diet - low sodium heart healthy    Complete by:  As directed      Increase activity slowly    Complete by:  As directed            Consultations:    Procedures Performed:  No results found.  Admission HPI: [name] is a pleasant 24 year old female who has recently been diagnosed with type 2 diabetes presenting to [facility] ED after seeing her PCP this morning and found to have ketonuria and N/V. In the ED she was found to have mild acidosis with bicarb of 15, anion gap of 16 and glucose of 478. Her only home medications are Metformin 500 mg bid. She was given a IVF and insulin. She states she has had progressively worse PO intake over the past week and has not been drinking many fluids. Reports dizziness when standing. Reports little appetite and nausea. She reports the nausea has been persistent over the past month with occasional emesis. Does report one episode of emesis this morning with thick yellow mucus. Denies any  hematemesis. Does report diarrhea that has been ongoing since she started the metformin but denies any hematochezia or melena. Denies any headaches, vision changes, chest pain, palpitations, shortness of breath or abdominal pain. Does note increased urinary frequency but denies any dysuria or hematuria.  Hospital Course by problem list: Active Problems:   DKA (diabetic ketoacidoses) ([place])   AKI (acute kidney injury) ([place])   DKA: Patient with AG 16 and bicarb of 15. Denies any chest pain, shortness of breath, headaches. No recent illness. She has had poor PO intake over the past week. Only on metformin 500 mg bid at home. Poor control of her sugars since diagnosed 1 month ago, usually range 300-400. Did report nausea and vomiting. Patient's gap closed overnight with insulin drip and IVF. She was  transitioned off insulin ggt to Lantus 25 units. She tolerated PO intake with no N/V or abdominal pain this morning. A1c 12.5. Discharged on Lantus 25 units qhs and metformin 500 mg bid.    Diabetes: She has been diagnosed with Type 2 DM and was started on Metformin a month ago. She has a strong family history of both type 1 and type 2 diabetes. She does have a BMI of 31 with acanthosis nigricans on exam suggestive of type 2. Checking anti-GAD and insulin AB to further delineate the diagnosis, still pending at discharge.   AKI: Cr 1.3 on admission. Improved to 1.0 following. Likely 2/2 dehydration. U/A was dirty but high number of squam cells present, most likely a dirty catch. She denied any urinary symptoms.    Discharge Vitals:   BP 116/78 mmHg  Pulse 82  Temp(Src) 98.6 F (37 C) (Oral)  Resp 20  Ht 5' 6"  (1.676 m)  Wt 197 lb (89.359 kg)  BMI 31.81 kg/m2  SpO2 100%  LMP 10/12/2015 (Exact Date)  Discharge Labs:  No results found for this or any previous visit (from the past 24 hour(s)).  Signed: Maryellen Pile, MD 11/04/2015, 8:11 PM

## 2015-11-03 NOTE — Plan of Care (Signed)
Problem: Food- and Nutrition-Related Knowledge Deficit (NB-1.1) Goal: Nutrition education Formal process to instruct or train a patient/client in a skill or to impart knowledge to help patients/clients voluntarily manage or modify food choices and eating behavior to maintain or improve health. Outcome: Completed/Met Date Met:  11/03/15  RD consulted for nutrition education regarding diabetes.  Pt drinks a lot of juice agreeable to make changes.     Lab Results  Component Value Date    HGBA1C 12.5* 11/02/2015    RD provided "Carbohydrate Counting for People with Diabetes" handout from the Academy of [employer] and Dietetics. Discussed different food groups and their effects on blood sugar, emphasizing carbohydrate-containing foods. Provided list of carbohydrates and recommended serving sizes of common foods.  Discussed importance of controlled and consistent carbohydrate intake throughout the day. Provided examples of ways to balance meals/snacks and encouraged intake of high-fiber, whole grain complex carbohydrates. Teach back method used.  Expect good compliance.  Body mass index is 31.81 kg/(m^2). Pt meets criteria for obesity class I based on current BMI.  Current diet order is CHO Modified, patient is consuming approximately 100% of meals at this time. Labs and medications reviewed. No further nutrition interventions warranted at this time. RD contact information provided. If additional nutrition issues arise, please re-consult RD.  Heather Pitts RD, LDN, CNSC [phone] Pager [phone] After Hours Pager         

## 2015-11-05 ENCOUNTER — Telehealth: Payer: Self-pay | Admitting: Internal Medicine

## 2015-11-05 LAB — POCT PREGNANCY, URINE: Preg Test, Ur: NEGATIVE

## 2015-11-05 LAB — GLUTAMIC ACID DECARBOXYLASE AUTO ABS

## 2015-11-05 NOTE — Telephone Encounter (Signed)
Called patient to see how she has been feeling since hospital discharge. She states that she was discharged with 25 units of insulin and has been unable to check her sugars because her pharmacy told her she was not due for refills on her strips. I explained that she should go to the pharmacy and buy OTC strips that will match her meter today. Explained dangers of giving insulin without knowing blood sugar level. She has confirmed Wednesdays appointment with me.   Ambrose FinlandValerie A [place] Hedman, NP 11/05/2015 11:56 AM

## 2015-11-07 ENCOUNTER — Ambulatory Visit (HOSPITAL_BASED_OUTPATIENT_CLINIC_OR_DEPARTMENT_OTHER): Payer: Managed Care, Other (non HMO) | Admitting: Internal Medicine

## 2015-11-07 ENCOUNTER — Encounter (HOSPITAL_COMMUNITY): Payer: Self-pay | Admitting: *Deleted

## 2015-11-07 ENCOUNTER — Emergency Department (HOSPITAL_COMMUNITY)
Admission: EM | Admit: 2015-11-07 | Discharge: 2015-11-07 | Disposition: A | Discharge status: Home / Self Care | Payer: Managed Care, Other (non HMO) | Attending: Emergency Medicine | Admitting: Emergency Medicine

## 2015-11-07 ENCOUNTER — Encounter: Payer: Self-pay | Admitting: Internal Medicine

## 2015-11-07 VITALS — BP 124/89 | HR 80 | Temp 97.8°F | Resp 17 | Ht 66.0 in | Wt 206.2 lb

## 2015-11-07 DIAGNOSIS — R739 Hyperglycemia, unspecified: Secondary | ICD-10-CM

## 2015-11-07 DIAGNOSIS — R824 Acetonuria: Secondary | ICD-10-CM

## 2015-11-07 DIAGNOSIS — E1365 Other specified diabetes mellitus with hyperglycemia: Secondary | ICD-10-CM

## 2015-11-07 DIAGNOSIS — R110 Nausea: Secondary | ICD-10-CM | POA: Diagnosis not present

## 2015-11-07 DIAGNOSIS — IMO0001 Reserved for inherently not codable concepts without codable children: Secondary | ICD-10-CM

## 2015-11-07 DIAGNOSIS — E109 Type 1 diabetes mellitus without complications: Secondary | ICD-10-CM | POA: Diagnosis not present

## 2015-11-07 DIAGNOSIS — E1065 Type 1 diabetes mellitus with hyperglycemia: Principal | ICD-10-CM

## 2015-11-07 DIAGNOSIS — Z794 Long term (current) use of insulin: Secondary | ICD-10-CM | POA: Insufficient documentation

## 2015-11-07 LAB — POCT URINALYSIS DIPSTICK
Bilirubin, UA: NEGATIVE
GLUCOSE UA: 500
Glucose, UA: 500
KETONES UA: 40
Ketones, UA: >=160
LEUKOCYTES UA: NEGATIVE
Leukocytes, UA: NEGATIVE
NITRITE UA: NEGATIVE
Nitrite, UA: NEGATIVE
Protein, UA: 30
Protein, UA: NEGATIVE
Spec Grav, UA: 1.02
Spec Grav, UA: 1.02
UROBILINOGEN UA: 0.2
Urobilinogen, UA: 0.2
pH, UA: 5.5
pH, UA: 5.5

## 2015-11-07 LAB — URINALYSIS, ROUTINE W REFLEX MICROSCOPIC
BILIRUBIN URINE: NEGATIVE
KETONES UR: 40 mg/dL — AB
LEUKOCYTES UA: NEGATIVE
Nitrite: NEGATIVE
PH: 5 (ref 5.0–8.0)
Protein, ur: NEGATIVE mg/dL
SPECIFIC GRAVITY, URINE: 1.024 (ref 1.005–1.030)

## 2015-11-07 LAB — BASIC METABOLIC PANEL
Anion gap: 10 (ref 5–15)
BUN: 6 mg/dL (ref 6–20)
CHLORIDE: 105 mmol/L (ref 101–111)
CO2: 23 mmol/L (ref 22–32)
CREATININE: 0.76 mg/dL (ref 0.44–1.00)
Calcium: 8.9 mg/dL (ref 8.9–10.3)
GFR calc non Af Amer: >60 mL/min (ref >60)
Glucose, Bld: 278 mg/dL — ABNORMAL HIGH (ref 65–99)
POTASSIUM: 3.8 mmol/L (ref 3.5–5.1)
SODIUM: 138 mmol/L (ref 135–145)

## 2015-11-07 LAB — CBG MONITORING, ED: Glucose-Capillary: 252 mg/dL — ABNORMAL HIGH (ref 65–99)

## 2015-11-07 LAB — GLUCOSE, POCT (MANUAL RESULT ENTRY)
POC GLUCOSE: 330 mg/dL — AB (ref 70–99)
POC Glucose: 308 mg/dl — AB (ref 70–99)

## 2015-11-07 LAB — URINE MICROSCOPIC-ADD ON: RBC / HPF: NONE SEEN RBC/hpf (ref 0–5)

## 2015-11-07 LAB — CBC
HEMATOCRIT: 36 % (ref 36.0–46.0)
Hemoglobin: 11.4 g/dL — ABNORMAL LOW (ref 12.0–15.0)
MCH: 26.8 pg (ref 26.0–34.0)
MCHC: 31.7 g/dL (ref 30.0–36.0)
MCV: 84.5 fL (ref 78.0–100.0)
PLATELETS: 204 10*3/uL (ref 150–400)
RBC: 4.26 MIL/uL (ref 3.87–5.11)
RDW: 14.7 % (ref 11.5–15.5)
WBC: 3.4 10*3/uL — AB (ref 4.0–10.5)

## 2015-11-07 LAB — TSH: TSH: 1.371 u[IU]/mL (ref 0.350–4.500)

## 2015-11-07 MED ORDER — PROMETHAZINE HCL 25 MG/ML IJ SOLN
12.5000 mg | Freq: Once | INTRAMUSCULAR | Status: AC
  Administered 2015-11-07: 12.5 mg via INTRAMUSCULAR

## 2015-11-07 MED ORDER — SODIUM CHLORIDE 0.9 % IV SOLN
Freq: Once | INTRAVENOUS | Status: AC
  Administered 2015-11-07: 12:00:00 via INTRAVENOUS

## 2015-11-07 MED ORDER — METOCLOPRAMIDE HCL 5 MG/ML IJ SOLN
10.0000 mg | Freq: Once | INTRAMUSCULAR | Status: AC
Start: 2015-11-07 — End: 2015-11-07
  Administered 2015-11-07: 10 mg via INTRAVENOUS
  Filled 2015-11-07: qty 2

## 2015-11-07 MED ORDER — SODIUM CHLORIDE 0.9 % IV BOLUS (SEPSIS)
1000.0000 mL | Freq: Once | INTRAVENOUS | Status: AC
  Administered 2015-11-07: 1000 mL via INTRAVENOUS

## 2015-11-07 MED ORDER — INSULIN ASPART 100 UNIT/ML ~~LOC~~ SOLN
10.0000 [IU] | Freq: Once | SUBCUTANEOUS | Status: AC
  Administered 2015-11-07: 10 [IU] via SUBCUTANEOUS

## 2015-11-07 MED ORDER — PROMETHAZINE HCL 25 MG PO TABS: 25.0000 mg | ORAL_TABLET | Freq: Four times a day (QID) | ORAL | Status: DC | PRN

## 2015-11-07 NOTE — Discharge Instructions (Signed)
You were seen in the ED today for evaluation of possible DKA. Your sugars were improved from earlier today and are now in the 250s. We were able to control your nausea. Your urine does show glucose and a few ketones, but not as high as it had been earlier. Your other labs are unremarkable and do not suggest DKA. Please take your Lantus as prescribed at the new dose. Please call NP Keck's office tomorrow to schedule follow-up. Return to the ER for new or worsening symptoms such as intractable vomiting, high fever, etc.

## 2015-11-07 NOTE — Progress Notes (Signed)
Patient ID: Angela Henderson, female   DOB: [birth year], 24 y.o.   MRN: [id number]  CC: HFU  HPI: Angela Henderson is a 24 y.o. female here today for a follow up visit.  Patient has past medical history of diabetes. She states that both her mother and grandmother are type 1 diabetics and her father is a type 2 diabetic. She has never been tested to figure out which type she is. She was seen by me last week and was found to be in DKA and was sent over to the ER for evaluation. She returns today with a elevated sugar and ketonuria. She has been taking 25 units of Lantus since discharge but sugars are still 303-447. She endorses nausea. She has not vomited. She has been scheduled for a endocrinology appointment on 11/20/15.  No Known Allergies Past Medical History  Diagnosis Date  . Type II diabetes mellitus ([place]) dx'd 09/2015  . DKA, type 2 ([place]) 1st admission 11/02/2015   Current Outpatient Prescriptions on File Prior to Visit  Medication Sig Dispense Refill  . insulin glargine (LANTUS) 100 UNIT/ML injection Inject 0.25 mLs (25 Units total) into the skin at bedtime. 10 mL 0  . glucose blood (ONE TOUCH TEST STRIPS) test strip Use to test blood glucose 3 times a day. Dx code E11.9 100 each 0  . insulin starter kit- syringes MISC 1 kit by Other route once. 1 kit 0  . Insulin Syringe-Needle U-100 31G X 1/4" 0.3 ML MISC 1 each by Does not apply route at bedtime. Use to inject insulin daily. Dx code E11.9 100 each 0  . ondansetron (ZOFRAN ODT) 4 MG disintegrating tablet Take 1 tablet (4 mg total) by mouth every 8 (eight) hours as needed for nausea. 10 tablet 0   No current facility-administered medications on file prior to visit.   Family History  Problem Relation Age of Onset  . Diabetes Mother   . Diabetes Father    Social History   Social History  . Marital Status: Single    Spouse Name: N/A  . Number of Children: N/A  . Years of Education: N/A   Occupational History  . Not on file.    Social History Main Topics  . Smoking status: Never Smoker   . Smokeless tobacco: Never Used  . Alcohol Use: 0.0 oz/week    0 Standard drinks or equivalent per week     Comment: 11/02/2015 "1-2 drinks on weekend maybe once/month"  . Drug Use: No  . Sexual Activity: Yes   Other Topics Concern  . Not on file   Social History Narrative    Review of Systems  Constitutional: Positive for weight loss.  Eyes: Positive for blurred vision.  Gastrointestinal: Positive for nausea. Negative for vomiting.  Genitourinary: Positive for frequency.  Neurological: Negative for dizziness and tingling.  Endo/Heme/Allergies: Positive for polydipsia.  All other systems reviewed and are negative.  Objective:   Filed Vitals:   11/07/15 1009  BP: 124/89  Pulse: 80  Temp: 97.8 F (36.6 C)  Resp: 17    Physical Exam  Constitutional: She is oriented to person, place, and time.  Cardiovascular: Normal rate, regular rhythm and normal heart sounds.   Pulmonary/Chest: Effort normal and breath sounds normal.  Abdominal: There is no tenderness.  Musculoskeletal: She exhibits no edema.  Neurological: She is alert and oriented to person, place, and time.  Skin: Skin is warm and dry.  Psychiatric: She has a normal mood and affect.  Lab Results  Component Value Date   WBC 5.3 11/02/2015   HGB 13.2 11/02/2015   HCT 40.4 11/02/2015   MCV 82.1 11/02/2015   PLT 270 11/02/2015   Lab Results  Component Value Date   CREATININE 1.04* 11/03/2015   BUN <5* 11/03/2015   NA 132* 11/03/2015   K 4.2 11/03/2015   CL 107 11/03/2015   CO2 16* 11/03/2015    Lab Results  Component Value Date   HGBA1C 12.5* 11/02/2015   Lipid Panel     Component Value Date/Time   CHOL 153 07/03/2010 1747   TRIG 53 07/03/2010 1747   HDL 36* 07/03/2010 1747   CHOLHDL 4.3 Ratio 07/03/2010 1747   VLDL 11 07/03/2010 1747   LDLCALC 106* 07/03/2010 1747       Assessment and plan:   Hatice was seen today for  follow-up.  Diagnoses and all orders for this visit:  Uncontrolled type 1 diabetes mellitus without complication (HCC) -     Glucose (CBG) -     insulin aspart (novoLOG) injection 10 Units; Inject 0.1 mLs (10 Units total) into the skin once. -     Urinalysis Dipstick -     C-peptide -     Insulin-free, total and bound), blood -     Insulin antibodies, blood -     ANA -     TSH -     Microalbumin, urine -     Insert peripheral IV I have increased her Lantus to 30 units nightly and I will bring her back in 1 week to see if she has had any improvements to prevent readmission. Patient is high risk for repeat admission since she is a new diabetic and currently uncontrolled. After next week she will follow up with Endocrinology.  I have addressed diet issues with her and how to keep a healthy diet over the holidays. Weight and exercise addressed to optimize medical management.  Patient told to call office if she has any concerns over the week.   Ketonuria -     0.9 %  sodium chloride infusion; Inject into the vein once. -     Insert peripheral IV I have hydrated patient in office and given 10 units of insulin. After repeat urine check patients ketones went up to greater than 160 and sugar only went down to 306. Her father has come to take patient to the ER, I suspect she is back in DKA.   Hyperglycemia See above   Nausea without vomiting -     promethazine (PHENERGAN) injection 12.5 mg; Inject 0.5 mLs (12.5 mg total) into the muscle once in office  Total time spent with patient was 60 minutes before transporting to ER. Time was spent attempting to keep patient out of hospital.  Return for tuesday with me--diabetes .        Lance Bosch, [facility] [phone] 11/07/2015, 10:34 AM

## 2015-11-07 NOTE — ED Notes (Signed)
Pt went for recheck to health and wellness, recently diagnosed with DM and DKA last week. Pt still has nausea and high cbgs. No acute distress noted at triage.

## 2015-11-07 NOTE — Progress Notes (Signed)
Patient is being sent to the ED for further evaluation Spoke with [place] in the ED and they are aware she is coming Loss adjuster, chartered[facility] transport

## 2015-11-07 NOTE — Patient Instructions (Signed)
Please schedule your eye appointment soon. You will need to have a eye exam yearly to watch for diabetic eye changes. I have increased your Lantus to 30 units per night. Please begin this tonight. Be sure to check your sugar as soon as you wake up, before lunch, and before dinner. I will see you again next week. Bring log with you.

## 2015-11-07 NOTE — ED Notes (Signed)
Patient is alert and orientedx4.  Patient was explained discharge instructions and they understood them with no questions.   

## 2015-11-07 NOTE — ED Provider Notes (Signed)
CSN: [id number]     Arrival date & time 11/07/15  1259 History   First MD Initiated Contact with Patient 11/07/15 1324     Chief Complaint  Patient presents with  . Nausea  . Hyperglycemia   HPI  Angela Henderson is an 24 y.o. female with newly diagnosed diabetes (thought to be type 2 initially, now unsure) who presents to the ED for evaluation of hyperglycemia and nausea. She was recently discharged after a 1 night admission for DKA. Seen in clinic today for f/u and found to have elevated glucose and ketonuria despite starting on metformin and 25U lantus at discharge. PCP sent her to the ED today for eval for possible DKA. Pt states that she has continued to feel intermittently nauseated since discharge but denies emesis. She states her cbg at home have been 300-400. She has been trying to eat healthier. Denies fever, chills, URI symptoms. Denies chest pain, sob, abdominal pain, dysuria, urinary frequency/urgency.  Past Medical History  Diagnosis Date  . Type II diabetes mellitus ([facility]) dx'd 09/2015    mother and grandmother are type 1, dad type 2  . DKA, type 2 ([place]) 1st admission 11/02/2015   Past Surgical History  Procedure Laterality Date  . No past surgeries     Family History  Problem Relation Age of Onset  . Diabetes Mother   . Diabetes Father    Social History  Substance Use Topics  . Smoking status: Never Smoker   . Smokeless tobacco: Never Used  . Alcohol Use: 0.0 oz/week    0 Standard drinks or equivalent per week     Comment: 11/02/2015 "1-2 drinks on weekend maybe once/month"   OB History    No data available     Review of Systems  All other systems reviewed and are negative.     Allergies  Review of patient's allergies indicates no known allergies.  Home Medications   Prior to Admission medications   Medication Sig Start Date End Date Taking? Authorizing Provider  insulin glargine (LANTUS) 100 UNIT/ML injection Inject 0.25 mLs (25 Units total) into the  skin at bedtime. 11/03/15  Yes Milagros Loll, MD  glucose blood (ONE TOUCH TEST STRIPS) test strip Use to test blood glucose 3 times a day. Dx code E11.9 Patient not taking: Reported on 11/07/2015 11/03/15   Milagros Loll, MD  insulin starter kit- syringes MISC 1 kit by Other route once. Patient not taking: Reported on 11/07/2015 11/03/15   Maryellen Pile, MD  Insulin Syringe-Needle U-100 31G X 1/4" 0.3 ML MISC 1 each by Does not apply route at bedtime. Use to inject insulin daily. Dx code E11.9 Patient not taking: Reported on 11/07/2015 11/03/15   Milagros Loll, MD  ondansetron (ZOFRAN ODT) 4 MG disintegrating tablet Take 1 tablet (4 mg total) by mouth every 8 (eight) hours as needed for nausea. Patient not taking: Reported on 11/07/2015 10/17/15   Noemi Chapel, MD   BP 114/85 mmHg  Pulse 61  Temp(Src) 98.9 F (37.2 C) (Oral)  Resp 18  SpO2 100%  LMP 10/12/2015 (Exact Date) Physical Exam  Constitutional: She is oriented to person, place, and time.  HENT:  Right Ear: External ear normal.  Left Ear: External ear normal.  Nose: Nose normal.  Mouth/Throat: Oropharynx is clear and moist. No oropharyngeal exudate.  Eyes: Conjunctivae and EOM are normal. Pupils are equal, round, and reactive to light.  Neck: Normal range of motion. Neck supple.  Cardiovascular: Normal rate, regular  rhythm, normal heart sounds and intact distal pulses.   Pulmonary/Chest: Effort normal and breath sounds normal. No respiratory distress. She has no wheezes. She exhibits no tenderness.  Abdominal: Soft. Bowel sounds are normal. She exhibits no distension. There is no tenderness. There is no rebound and no guarding.  Musculoskeletal: She exhibits no edema.  Neurological: She is alert and oriented to person, place, and time. No cranial nerve deficit.  Skin: Skin is warm and dry. There is pallor.  Psychiatric: She has a normal mood and affect.  Nursing note and vitals reviewed.   ED Course  Procedures  (including critical care time) Labs Review Labs Reviewed  BASIC METABOLIC PANEL - Abnormal; Notable for the following:    Glucose, Bld 278 (*)    All other components within normal limits  CBC - Abnormal; Notable for the following:    WBC 3.4 (*)    Hemoglobin 11.4 (*)    All other components within normal limits  URINALYSIS, ROUTINE W REFLEX MICROSCOPIC (NOT AT [facility]) - Abnormal; Notable for the following:    Glucose, UA >1000 (*)    Hgb urine dipstick SMALL (*)    Ketones, ur 40 (*)    All other components within normal limits  URINE MICROSCOPIC-ADD ON - Abnormal; Notable for the following:    Squamous Epithelial / LPF 6-30 (*)    Bacteria, UA FEW (*)    All other components within normal limits  CBG MONITORING, ED - Abnormal; Notable for the following:    Glucose-Capillary 252 (*)    All other components within normal limits    Imaging Review No results found. I have personally reviewed and evaluated these images and lab results as part of my medical decision-making.   EKG Interpretation None      MDM   Final diagnoses:  Hyperglycemia  Other specified diabetes mellitus with hyperglycemia ([place])   Pt's labs are unremarkable. Her glucose is improved to 252 on POC CBG here (308 at PCP), 278 on BMP. Although this is elevated this is not as high as I would expect for DKA or HHS. Her UA does show 40 ketones and >1000 glucose, but this is similar to where she was at discharge. She has no anion gap, no signs of acidosis. She was given reglan to help with her nausea (phenergan was given at PCP) and pt reports relief in symptoms and is able to tolerate PO. Her VSS and at this time I do not believe pt to be in DKA. I discussed with pt and she feels comfortable going home as well. Instructed to take lantus at newly prescribed dose of 30U QHS per PCP note. Pt to f/u with PCP this week. She has endocrinology appointment scheduled for early January. ER return precautions given.    Anne Ng, PA-C 11/07/15 2144  Julianne Rice, MD 11/10/15 [phone]

## 2015-11-08 LAB — ANA: Anti Nuclear Antibody(ANA): POSITIVE — AB

## 2015-11-08 LAB — ANTI-NUCLEAR AB-TITER (ANA TITER)

## 2015-11-08 LAB — MICROALBUMIN, URINE: Microalb, Ur: 7.7 mg/dL

## 2015-11-08 LAB — C-PEPTIDE: C PEPTIDE: 1.61 ng/mL (ref 0.80–3.90)

## 2015-11-10 LAB — INSULIN ANTIBODIES, BLOOD: Insulin Antibodies, Human: 6.1 uU/mL — ABNORMAL HIGH

## 2015-11-13 ENCOUNTER — Ambulatory Visit: Payer: Managed Care, Other (non HMO) | Attending: Internal Medicine | Admitting: Internal Medicine

## 2015-11-13 ENCOUNTER — Telehealth: Payer: Self-pay

## 2015-11-13 ENCOUNTER — Other Ambulatory Visit: Payer: Self-pay | Admitting: Obstetrics and Gynecology

## 2015-11-13 ENCOUNTER — Encounter: Payer: Self-pay | Admitting: Internal Medicine

## 2015-11-13 VITALS — BP 128/82 | HR 71 | Temp 98.0°F | Resp 16 | Ht 66.0 in | Wt 206.0 lb

## 2015-11-13 DIAGNOSIS — Z794 Long term (current) use of insulin: Secondary | ICD-10-CM | POA: Diagnosis not present

## 2015-11-13 DIAGNOSIS — E1065 Type 1 diabetes mellitus with hyperglycemia: Secondary | ICD-10-CM

## 2015-11-13 DIAGNOSIS — IMO0001 Reserved for inherently not codable concepts without codable children: Secondary | ICD-10-CM | POA: Insufficient documentation

## 2015-11-13 DIAGNOSIS — E109 Type 1 diabetes mellitus without complications: Secondary | ICD-10-CM | POA: Diagnosis present

## 2015-11-13 LAB — POCT URINALYSIS DIPSTICK
Bilirubin, UA: NEGATIVE
GLUCOSE UA: 500
Ketones, UA: NEGATIVE
Leukocytes, UA: NEGATIVE
NITRITE UA: NEGATIVE
PROTEIN UA: NEGATIVE
Spec Grav, UA: <=1.005
UROBILINOGEN UA: 0.2
pH, UA: 6

## 2015-11-13 LAB — GLUCOSE, POCT (MANUAL RESULT ENTRY)
POC GLUCOSE: 405 mg/dL — AB (ref 70–99)
POC Glucose: 476 mg/dl — AB (ref 70–99)

## 2015-11-13 MED ORDER — INSULIN ASPART 100 UNIT/ML ~~LOC~~ SOLN
20.0000 [IU] | Freq: Once | SUBCUTANEOUS | Status: AC
  Administered 2015-11-13: 20 [IU] via SUBCUTANEOUS

## 2015-11-13 MED ORDER — INSULIN GLARGINE 100 UNIT/ML ~~LOC~~ SOLN: 40.0000 [IU] | Freq: Two times a day (BID) | SUBCUTANEOUS | Status: DC

## 2015-11-13 MED ORDER — INSULIN ASPART 100 UNIT/ML ~~LOC~~ SOLN: 5.0000 [IU] | Freq: Three times a day (TID) | SUBCUTANEOUS | Status: DC

## 2015-11-13 NOTE — Telephone Encounter (Signed)
Tried to call patient  Patient not available Left message on voice mail to return our call 

## 2015-11-13 NOTE — Progress Notes (Signed)
Patient here for follow up on her diabetes Patient presents in office with elevated blood sugars Per provider 20units novolog given

## 2015-11-13 NOTE — Progress Notes (Signed)
Patient ID: Angela Henderson, female   DOB: [birth year], 24 y.o.   MRN: [id number]  CC: diabetes follow up  HPI: Angela Henderson is a 24 y.o. female here today for a follow up visit.  Patient has past medical history of uncontrolled type 1 diabetic and DKA. Patient reports that since last week's visit with me, she has increased her insulin to 35 units of Lantus twice per day which has helped to bring her sugars down on some days to 255. She reports that she now feels better with no nausea or vomiting. CBG ranges of 255 to 571 for the past week. Patient reports that she is due to establish care with her endocrinologist next week. She also notes that she has been using her Father's Lantus because her medication co-pay was over $200.   Patient has No headache, No chest pain, No abdominal pain - No Nausea, No new weakness tingling or numbness, No Cough - SOB.  No Known Allergies Past Medical History  Diagnosis Date  . Type II diabetes mellitus ([place]) dx'd 09/2015    mother and grandmother are type 1, dad type 2  . DKA, type 2 ([place]) 1st admission 11/02/2015   Current Outpatient Prescriptions on File Prior to Visit  Medication Sig Dispense Refill  . glucose blood (ONE TOUCH TEST STRIPS) test strip Use to test blood glucose 3 times a day. Dx code E11.9 (Patient not taking: Reported on 11/07/2015) 100 each 0  . insulin glargine (LANTUS) 100 UNIT/ML injection Inject 0.25 mLs (25 Units total) into the skin at bedtime. 10 mL 0  . insulin starter kit- syringes MISC 1 kit by Other route once. (Patient not taking: Reported on 11/07/2015) 1 kit 0  . Insulin Syringe-Needle U-100 31G X 1/4" 0.3 ML MISC 1 each by Does not apply route at bedtime. Use to inject insulin daily. Dx code E11.9 (Patient not taking: Reported on 11/07/2015) 100 each 0  . ondansetron (ZOFRAN ODT) 4 MG disintegrating tablet Take 1 tablet (4 mg total) by mouth every 8 (eight) hours as needed for nausea. (Patient not taking: Reported on  11/07/2015) 10 tablet 0  . promethazine (PHENERGAN) 25 MG tablet Take 1 tablet (25 mg total) by mouth every 6 (six) hours as needed for nausea or vomiting. 30 tablet 0   No current facility-administered medications on file prior to visit.   Family History  Problem Relation Age of Onset  . Diabetes Mother   . Diabetes Father    Social History   Social History  . Marital Status: Single    Spouse Name: N/A  . Number of Children: N/A  . Years of Education: N/A   Occupational History  . Not on file.   Social History Main Topics  . Smoking status: Never Smoker   . Smokeless tobacco: Never Used  . Alcohol Use: 0.0 oz/week    0 Standard drinks or equivalent per week     Comment: 11/02/2015 "1-2 drinks on weekend maybe once/month"  . Drug Use: No  . Sexual Activity: Yes   Other Topics Concern  . Not on file   Social History Narrative    Review of Systems: Other than what is stated in HPI, all other systems are negative.   Objective:   Filed Vitals:   11/13/15 1755  BP: 128/82  Pulse: 71  Temp: 98 F (36.7 C)  Resp: 16    Physical Exam  Constitutional: She is oriented to person, place, and time.  Cardiovascular: Normal rate,  regular rhythm and normal heart sounds.   Pulses:      Dorsalis pedis pulses are 2+ on the right side, and 2+ on the left side.       Posterior tibial pulses are 2+ on the right side, and 2+ on the left side.  Pulmonary/Chest: Effort normal and breath sounds normal.  Feet:  Right Foot:  Protective Sensation: 10 sites tested.10 sites sensed. Skin Integrity: Negative for skin breakdown.  Left Foot:  Protective Sensation: 10 sites tested. 10 sites sensed. Skin Integrity: Negative for skin breakdown.  Neurological: She is alert and oriented to person, place, and time.  Skin: Skin is warm and dry.  Psychiatric: She has a normal mood and affect.     Lab Results  Component Value Date   WBC 3.4* 11/07/2015   HGB 11.4* 11/07/2015   HCT 36.0  11/07/2015   MCV 84.5 11/07/2015   PLT 204 11/07/2015   Lab Results  Component Value Date   CREATININE 0.76 11/07/2015   BUN 6 11/07/2015   NA 138 11/07/2015   K 3.8 11/07/2015   CL 105 11/07/2015   CO2 23 11/07/2015    Lab Results  Component Value Date   HGBA1C 12.5* 11/02/2015   Lipid Panel     Component Value Date/Time   CHOL 153 07/03/2010 1747   TRIG 53 07/03/2010 1747   HDL 36* 07/03/2010 1747   CHOLHDL 4.3 Ratio 07/03/2010 1747   VLDL 11 07/03/2010 1747   LDLCALC 106* 07/03/2010 1747       Assessment and plan:   Angela Henderson was seen today for follow-up.  Diagnoses and all orders for this visit:  Uncontrolled type 1 diabetes mellitus without complication (HCC) -     Glucose (CBG) -     Urinalysis Dipstick -     insulin aspart (novoLOG) injection 20 Units; Inject 0.2 mLs (20 Units total) into the skin once. -     Increased to insulin glargine (LANTUS) 100 UNIT/ML injection; Inject 0.4 mLs (40 Units total) into the skin 2 (two) times daily. -     Begin insulin aspart (NOVOLOG) 100 UNIT/ML injection; Inject 5 Units into the skin 3 (three) times daily with meals. -     Ambulatory referral to Ophthalmology -     Glucose (CBG) Novolog added today and Lantus increased. Patient had elevated insulin antibody results. I will let Endocrinology take over on her diabetes management starting next week. She may call clinic if sugar continue to stay elevated until seen by Endo. Explained how to properly use Novolog.   Return if symptoms worsen or fail to improve.        Lance Bosch, [facility] [phone] 11/13/2015, 3:07 PM

## 2015-11-13 NOTE — Patient Instructions (Addendum)
Ask pharmacist if Levemir will be cheaper with your insurance?   If Novolog is too expensive, which is cheaper or covered? If they have something that cheaper on your insurance call me with name or have pharmacy to call and I will switch it.  40 units of Lantus twice per day And 5 units of Novolog three times per day with meals.  Take meter to Endocrinology appointment Please get eye exam yearly.     Call your nurse, Angelique BlonderDenise @ [phone] if you have any questions

## 2015-11-14 LAB — INSULIN ANTIBODIES, BLOOD
Insulin Antibodies, Human: <0.4 U/mL (ref <0.4)
Insulin Antibodies, Human: <0.4 U/mL (ref <0.4)

## 2015-11-15 LAB — CYTOLOGY - PAP

## 2015-11-20 ENCOUNTER — Ambulatory Visit: Payer: 59

## 2015-11-20 ENCOUNTER — Other Ambulatory Visit: Payer: Self-pay | Admitting: *Deleted

## 2015-11-20 ENCOUNTER — Ambulatory Visit (INDEPENDENT_AMBULATORY_CARE_PROVIDER_SITE_OTHER): Payer: Managed Care, Other (non HMO) | Admitting: Endocrinology

## 2015-11-20 ENCOUNTER — Encounter: Payer: Self-pay | Admitting: Endocrinology

## 2015-11-20 VITALS — BP 126/80 | HR 64 | Temp 98.5°F | Resp 16 | Ht 64.0 in | Wt 203.8 lb

## 2015-11-20 DIAGNOSIS — E1065 Type 1 diabetes mellitus with hyperglycemia: Secondary | ICD-10-CM | POA: Diagnosis not present

## 2015-11-20 MED ORDER — GLUCOSE BLOOD VI STRP: ORAL_STRIP | Status: DC

## 2015-11-20 MED ORDER — INSULIN DEGLUDEC 200 UNIT/ML ~~LOC~~ SOPN
95.0000 [IU] | PEN_INJECTOR | Freq: Every day | SUBCUTANEOUS | Status: DC
Start: 2015-11-20 — End: 2016-11-03

## 2015-11-20 NOTE — Progress Notes (Signed)
Patient ID: Angela Henderson, female   DOB: July 01, 1991, 25 y.o.   MRN: [id number]          Reason for Appointment : Consultation for Type 1 Diabetes  History of Present Illness          Diagnosis: Type 1 diabetes mellitus, date of diagnosis:   09/2015           INSULIN regimen is: 40 units Lantus bid   Recent history:   She had presented to the emergency room with symptoms of nausea and was found to have a blood sugar of about 350 She also started losing weight and in all had lost 20 pounds She also had symptoms of increased thirst and urination She was treated with metformin initially but she does not symptomatically worse and was admitted to the hospital overnight for ketoacidosis in 12/16 She was however discharged home on Lantus insulin only Metformin was stopped.  She also was reporting nausea with this    Glucose monitoring:  is being done 3 times a week        Glucometer: One Touch or [facility] and .      Blood Glucose readings from meter showed recent readings to be ranging from 355-573 Checking sporadically  Glucose patterns, problems identified and current management:    She is checking her blood sugars irregularly and glucose was 355 yesterday morning  She still continues to have increased thirst and frequent urination, getting up 3 times at night usually  She was told to start Novolog by her PCP but she could not afford it and has not done so      She was briefly seen by the dietitian but does not follow any meal plan and is eating fast food regularly   She has been on a higher dose of Lantus after her last visit with PCP about a week ago but blood sugars are not improved.  She is also complaining about the cost of this  Self-care: The diet that the patient has been following is: None.  She is drinking cranberry juice frequently  Mealtimes are: No breakfast usually, Lunch: 2 PM  Dinner: Variable         Exercise:  none recently, previously was going to the gym 2-3  times a week for 30-60 minutes in the evenings for cardio exercises or swimming        Dietician consultation: Most recent: 12/16 as above          CDE consultation: None  Diabetes labs:  Lab Results  Component Value Date   HGBA1C 12.5* 11/02/2015   Lab Results  Component Value Date   MICROALBUR 7.7 11/07/2015   LDLCALC 106* 07/03/2010   CREATININE 0.76 11/07/2015      Medication List       This list is accurate as of: 11/20/15  1:25 PM.  Always use your most recent med list.               glucose blood test strip  Commonly known as:  ONE TOUCH TEST STRIPS  Use to test blood glucose 3 times a day. Dx code E11.9     insulin aspart 100 UNIT/ML injection  Commonly known as:  NOVOLOG  Inject 5 Units into the skin 3 (three) times daily with meals.     insulin glargine 100 UNIT/ML injection  Commonly known as:  LANTUS  Inject 0.4 mLs (40 Units total) into the skin 2 (two) times daily.  insulin starter kit- syringes Misc  1 kit by Other route once.     Insulin Syringe-Needle U-100 31G X 1/4" 0.3 ML Misc  1 each by Does not apply route at bedtime. Use to inject insulin daily. Dx code E11.9     ondansetron 4 MG disintegrating tablet  Commonly known as:  ZOFRAN ODT  Take 1 tablet (4 mg total) by mouth every 8 (eight) hours as needed for nausea.     promethazine 25 MG tablet  Commonly known as:  PHENERGAN  Take 1 tablet (25 mg total) by mouth every 6 (six) hours as needed for nausea or vomiting.        Allergies: No Known Allergies  Past Medical History  Diagnosis Date  . Type II diabetes mellitus ([place]) dx'd 09/2015    mother and grandmother are type 1, dad type 2  . DKA, type 2 ([place]) 1st admission 11/02/2015  . Hyperlipidemia     Past Surgical History  Procedure Laterality Date  . No past surgeries      Family History  Problem Relation Age of Onset  . Diabetes Mother     Type I  . Diabetes Father   . Hypertension Father   . Diabetes Cousin      Type I    Social History:  reports that she has never smoked. She has never used smokeless tobacco. She reports that she drinks alcohol. She reports that she does not use illicit drugs.    Review of Systems       Lipids: Has mild abnormality, has not been told to have Hyperlipidemia before or treated  Lab Results  Component Value Date   CHOL 153 07/03/2010   HDL 36* 07/03/2010   LDLCALC 106* 07/03/2010   TRIG 53 07/03/2010   CHOLHDL 4.3 Ratio 07/03/2010        Headaches: None              No blurred vision, has not had any eye exams recently     Skin: No rash or infections     Thyroid:  No  unusual fatigue or cold intolerance.     No swelling of feet.     No shortness of breath on exertion.     Bowel habits:  normal      She has normal regular menstrual cycles every month      No joint  pains.         No history of Numbness, tingling or burning in feet     LABS:  No visits with results within 1 Week(s) from this visit. Latest known visit with results is:  Office Visit on 11/13/2015  Component Date Value Ref Range Status  . POC Glucose 11/13/2015 476.0* 70 - 99 mg/dl Final  . Color, UA 11/13/2015 yellow   Final  . Clarity, UA 11/13/2015 clear   Final  . Glucose, UA 11/13/2015 500   Final  . Bilirubin, UA 11/13/2015 neg   Final  . Ketones, UA 11/13/2015 neg   Final  . Spec Grav, UA 11/13/2015 <=1.005   Final  . Blood, UA 11/13/2015 small   Final  . pH, UA 11/13/2015 6.0   Final  . Protein, UA 11/13/2015 neg   Final  . Urobilinogen, UA 11/13/2015 0.2   Final  . Nitrite, UA 11/13/2015 neg   Final  . Leukocytes, UA 11/13/2015 Negative  Negative Final  . POC Glucose 11/13/2015 405* 70 - 99 mg/dl Final    Physical  Examination:  BP 126/80 mmHg  Pulse 64  Temp(Src) 98.5 F (36.9 C)  Resp 16  Ht 5' 4"  (1.626 m)  Wt 203 lb 12.8 oz (92.443 kg)  BMI 34.96 kg/m2  SpO2 98%  LMP 10/12/2015 (Exact Date)  GENERAL:  generalized obesity present HEENT:          Eye exam shows normal external appearance. Fundus exam shows no retinopathy.  Oral exam shows normal mucosa .  NECK:         General:  Neck exam shows no lymphadenopathy.  Thyroid is not enlarged and no nodules felt.   LUNGS:         Chest is symmetrical. Lungs are clear to auscultation.Marland Kitchen   HEART:         Heart sounds:  S1 and S2 are normal. No murmurs or clicks heard., no S3 or S4.   ABDOMEN:  no distention present. Liver and spleen are not palpable. No other mass or tenderness present.  EXTREMITIES:     There is no edema. No skin lesions present.Marland Kitchen  NEUROLOGICAL:      Ankle jerks are absent bilaterally, biceps reflexes appear normal .          Diabetic foot exam:  normal monofilament sensation MUSCULOSKELETAL:       There is no enlargement or deformity of the joints.  SKIN:       No rash or other lesions.  Minimal acanthosis of front-end sides of the lower neck.  No hirsutism or acne       ASSESSMENT:  Diabetes type 1, recently diagnosed and also had episode of ketosis last month Although she is significantly obese and also has a family history of type 2 diabetes clinically she is appearing to be type I but may also have significant insulin resistance with her blood sugars not responding to even 80 units of insulin a day Her blood sugars are significantly high and mostly over 300 but recently ketones were negative and she is not having any nausea  Problems identified:   She is having difficulty affording her insulin.  Currently only on Lantus and not clear if she is taking this consistently  Since she has markedly increased fasting readings including today she is still appears to be getting an adequate basal insulin  No mealtime insulin coverage  Poor diet to excessive amount of caloric intake due to fats and also some excessive use of juices  Sporadic glucose monitoring  Minimal diabetes education so far and has an adequate knowledge of insulin, meal planning and glucose  monitoring   PLAN:     Start regular insulin 15 units before each meal which she can get from [place] and inject with a syringe  She will try to get [place] U-200 and use 90 units a day if covered by insurance  She may also use Basaglar if available less expensive than Lantus, she can get the co-pay card online  Given her titration sheet for adjusting basal insulin based on fasting blood sugars every 3 days  She will start with 15 units of regular insulin before each meal and increase by 5 units if postprandial readings are still over 200  Consider adding a GLP-1 drug if blood sugars are still poorly controlled  Avoid fast food and reduce fat intake overall, avoid juices  Start exercises when blood sugar below 250  Follow-up with diabetes educator next week  She can start periodic eye exams once blood sugars are stabilized  Patient Instructions  Check blood sugars on waking up   times a week Also check blood sugars about 2 hours after a meal and do this after different meals by rotation  Recommended blood sugar levels on waking up is 90-130 and about 2 hours after meal is 130-160  Please bring your blood sugar monitor to each visit, thank you  Increase Lantus to 45 units twice a day.  If able to get the Basaglar insulin use the same dose  If able to get TRESIBA start taking 90 units a day  Increase the doses as on the flow sheet to get morning sugar under 150 using 5 units increments every 3 days  REGULAR INSULIN: This has to be taken into 15 minutes before eating. Start with 15 units before lunch and supper If the blood sugar about 2 hours after eating is still over 200 increase the dose to 20 units before that meal  Avoid high fat meats, fried food and increase more grilled foods, salads and vegetables No drinks which sugar or juice          Erryn Dickison 11/20/2015, 1:25 PM   Note: This note was prepared with Estate agent. Any  transcriptional errors that result from this process are unintentional.

## 2015-11-20 NOTE — Patient Instructions (Addendum)
Check blood sugars on waking up   times a week Also check blood sugars about 2 hours after a meal and do this after different meals by rotation  Recommended blood sugar levels on waking up is 90-130 and about 2 hours after meal is 130-160  Please bring your blood sugar monitor to each visit, thank you  Increase Lantus to 45 units twice a day.  If able to get the Basaglar insulin use the same dose  If able to get TRESIBA start taking 90 units a day  Increase the doses as on the flow sheet to get morning sugar under 150 using 5 units increments every 3 days  REGULAR INSULIN: This has to be taken into 15 minutes before eating. Start with 15 units before lunch and supper If the blood sugar about 2 hours after eating is still over 200 increase the dose to 20 units before that meal  Avoid high fat meats, fried food and increase more grilled foods, salads and vegetables No drinks which sugar or juice

## 2015-11-27 ENCOUNTER — Ambulatory Visit: Payer: 59

## 2015-11-27 ENCOUNTER — Encounter: Payer: Managed Care, Other (non HMO) | Admitting: Nutrition

## 2015-12-04 ENCOUNTER — Ambulatory Visit: Payer: 59

## 2015-12-04 ENCOUNTER — Telehealth: Payer: Self-pay

## 2015-12-04 NOTE — Telephone Encounter (Signed)
-----   Message from Ambrose Finland, NP sent at 12/04/2015 12:10 PM EST ----- Labs were ok.

## 2015-12-04 NOTE — Telephone Encounter (Signed)
Tried to contact patient this am Message states the person you are trying to contact is not taking Calls Please try your call again later

## 2015-12-06 ENCOUNTER — Ambulatory Visit: Payer: Managed Care, Other (non HMO) | Admitting: Endocrinology

## 2015-12-20 ENCOUNTER — Ambulatory Visit: Payer: 59 | Admitting: *Deleted

## 2015-12-24 ENCOUNTER — Telehealth: Payer: Self-pay | Admitting: Internal Medicine

## 2015-12-24 ENCOUNTER — Telehealth: Payer: Self-pay

## 2015-12-24 MED ORDER — GLUCOSE BLOOD VI STRP: ORAL_STRIP | Status: DC

## 2015-12-24 MED ORDER — BAYER CONTOUR LINK MONITOR W/DEVICE KIT: PACK | Status: DC

## 2015-12-24 NOTE — Telephone Encounter (Signed)
Patient called requesting a refill for Test strips and the Meter Patient stated that insurance does not cover the One Touch Strip and is requesting Contour Net.  Please follow up.

## 2015-12-24 NOTE — Telephone Encounter (Signed)
Returned patient phone call Patient is requesting a new glucometer and test strips for  A contour so her insurance will cover it Prescription sent to pharmacy on file

## 2016-10-23 ENCOUNTER — Other Ambulatory Visit: Payer: Self-pay | Admitting: Obstetrics and Gynecology

## 2016-10-23 LAB — OB RESULTS CONSOLE RUBELLA ANTIBODY, IGM: RUBELLA: IMMUNE

## 2016-10-23 LAB — OB RESULTS CONSOLE HEPATITIS B SURFACE ANTIGEN: HEP B S AG: NEGATIVE

## 2016-10-23 LAB — OB RESULTS CONSOLE RPR: RPR: NONREACTIVE

## 2016-10-23 LAB — OB RESULTS CONSOLE HIV ANTIBODY (ROUTINE TESTING): HIV: NONREACTIVE

## 2016-10-23 LAB — OB RESULTS CONSOLE GC/CHLAMYDIA
Chlamydia: NEGATIVE
GC PROBE AMP, GENITAL: NEGATIVE

## 2016-10-29 LAB — CYTOLOGY - PAP

## 2016-10-31 ENCOUNTER — Other Ambulatory Visit (INDEPENDENT_AMBULATORY_CARE_PROVIDER_SITE_OTHER): Payer: Medicaid Other

## 2016-10-31 DIAGNOSIS — IMO0001 Reserved for inherently not codable concepts without codable children: Secondary | ICD-10-CM

## 2016-10-31 DIAGNOSIS — E1065 Type 1 diabetes mellitus with hyperglycemia: Secondary | ICD-10-CM | POA: Diagnosis not present

## 2016-10-31 LAB — MICROALBUMIN / CREATININE URINE RATIO
Creatinine,U: 164.1 mg/dL
MICROALB/CREAT RATIO: 1.4 mg/g (ref 0.0–30.0)
Microalb, Ur: 2.3 mg/dL — ABNORMAL HIGH (ref 0.0–1.9)

## 2016-10-31 LAB — BASIC METABOLIC PANEL
BUN: 8 mg/dL (ref 6–23)
CALCIUM: 9.3 mg/dL (ref 8.4–10.5)
CHLORIDE: 102 meq/L (ref 96–112)
CO2: 24 meq/L (ref 19–32)
Creatinine, Ser: 0.7 mg/dL (ref 0.40–1.20)
GFR: 128.62 mL/min (ref >60.00)
GLUCOSE: 153 mg/dL — AB (ref 70–99)
POTASSIUM: 4 meq/L (ref 3.5–5.1)
SODIUM: 133 meq/L — AB (ref 135–145)

## 2016-10-31 LAB — HEMOGLOBIN A1C: HEMOGLOBIN A1C: 6.8 % — AB (ref 4.6–6.5)

## 2016-11-03 ENCOUNTER — Encounter: Payer: Self-pay | Admitting: Endocrinology

## 2016-11-03 ENCOUNTER — Ambulatory Visit (INDEPENDENT_AMBULATORY_CARE_PROVIDER_SITE_OTHER): Payer: Medicaid Other | Admitting: Endocrinology

## 2016-11-03 ENCOUNTER — Ambulatory Visit: Payer: Self-pay | Admitting: Endocrinology

## 2016-11-03 ENCOUNTER — Other Ambulatory Visit: Payer: Self-pay

## 2016-11-03 VITALS — BP 120/70 | HR 81 | Ht 66.0 in | Wt 205.0 lb

## 2016-11-03 DIAGNOSIS — E1165 Type 2 diabetes mellitus with hyperglycemia: Secondary | ICD-10-CM | POA: Diagnosis not present

## 2016-11-03 MED ORDER — INSULIN LISPRO 100 UNIT/ML ~~LOC~~ SOLN: SUBCUTANEOUS | 11 refills | Status: DC

## 2016-11-03 MED ORDER — GLUCOSE BLOOD VI STRP: ORAL_STRIP | 5 refills | Status: DC

## 2016-11-03 MED ORDER — METFORMIN HCL ER 500 MG PO TB24: ORAL_TABLET | ORAL | 2 refills | Status: DC

## 2016-11-03 NOTE — Patient Instructions (Addendum)
Check blood sugars on waking up  Every 2 days  Also check blood sugars about 2 hours after a meal and do this after different meals by rotation  Recommended blood sugar levels on waking up is 60-90 and about 2 hours after meal is 100-130  Please bring your blood sugar monitor to each visit, thank you  WAlk daily  Start taking metformin ER 500 mg right after finishing lunch, if able to tolerate this increase it to twice a day after 1 week.  Call us if there is a problem with taking the medication

## 2016-11-03 NOTE — Progress Notes (Signed)
Patient ID: Angela Henderson, female   DOB: [birth year], 25 y.o.   MRN: [id number]          Reason for Appointment : Follow-up for diabetes  History of Present Illness          Date of diagnosis:   09/2015         PREVIOUS history She had presented to the emergency room with symptoms of nausea and high blood sugars and was found to have a blood sugar of about 350 She was treated with metformin initially but she does not symptomatically worse and was admitted to the hospital overnight for ketoacidosis in 12/16 Metformin was stopped.  She also was reporting nausea with this Although she appeared to require large doses of insulin on her initial consultation in 1/17 she did not follow-up subsequently   Recent history:    CURRENT treatment: None  Glucose patterns, problems identified and current management:    She is not monitoring her blood sugar is apparently her contour test strips are not covered by her insurance  She claims that her blood sugars are in the 90s fasting and no higher than 124 after meals but not clear how often she is checking these.  She is using her father's glucose monitor.  She stopped taking her insulin on her own in June since her sugars were fairly normal and did not follow-up with any physician subsequently  She is not referred here by her gynecologist because of her recent pregnancy, currently [redacted] weeks pregnant  Glucose monitoring:  is being done occasionally with her father's monitor Self-care: The diet that the patient has been following is: None.    Mealtimes are: No breakfast usually, Lunch: 2 PM  Dinner: Variable          Exercise:  none recently, walking a little        Dietician consultation: Most recent: 12/16 in the hospital         CDE consultation: None  Wt Readings from Last 3 Encounters:  11/03/16 205 lb (93 kg)  11/20/15 203 lb 12.8 oz (92.4 kg)  11/13/15 206 lb (93.4 kg)    Diabetes labs:  Lab Results  Component Value Date   HGBA1C 6.8 (H) 10/31/2016   HGBA1C 12.5 (H) 11/02/2015   Lab Results  Component Value Date   MICROALBUR 2.3 (H) 10/31/2016   LDLCALC 106 (H) 07/03/2010   CREATININE 0.70 10/31/2016    Allergies as of 11/03/2016   No Known Allergies     Medication List       Accurate as of 11/03/16  8:12 PM. Always use your most recent med list.          [employer] CONTOUR LINK MONITOR w/Device Kit Test blood sugar tid and QHS   glucose blood test strip Commonly known as:  ACCU-CHEK GUIDE Use to test blood sugar 3 times daily   insulin starter kit- syringes Misc 1 kit by Other route once.   Insulin Syringe-Needle U-100 31G X 1/4" 0.3 ML Misc 1 each by Does not apply route at bedtime. Use to inject insulin daily. Dx code E11.9   metFORMIN 500 MG 24 hr tablet Commonly known as:  GLUCOPHAGE-XR Take 2 tablets daily       Allergies: No Known Allergies  Past Medical History:  Diagnosis Date  . DKA, type 2 ([place]) 1st admission 11/02/2015  . Hyperlipidemia   . Type II diabetes mellitus ([place]) dx'd 09/2015   mother and grandmother are type 1,  dad type 2    Past Surgical History:  Procedure Laterality Date  . NO PAST SURGERIES      Family History  Problem Relation Age of Onset  . Diabetes Mother     Type I  . Diabetes Father   . Hypertension Father   . Diabetes Cousin     Type I    Social History:  reports that she has never smoked. She has never used smokeless tobacco. She reports that she drinks alcohol. She reports that she does not use drugs.    Review of Systems       Lipids: Has No recent evaluation   Lab Results  Component Value Date   CHOL 153 07/03/2010   HDL 36 (L) 07/03/2010   LDLCALC 106 (H) 07/03/2010   TRIG 53 07/03/2010   CHOLHDL 4.3 Ratio 07/03/2010       No history of hypertension  LABS:  Lab on 10/31/2016  Component Date Value Ref Range Status  . Hgb A1c MFr Bld 10/31/2016 6.8* 4.6 - 6.5 % Final  . Sodium 10/31/2016 133* 135 - 145 mEq/L Final    . Potassium 10/31/2016 4.0  3.5 - 5.1 mEq/L Final  . Chloride 10/31/2016 102  96 - 112 mEq/L Final  . CO2 10/31/2016 24  19 - 32 mEq/L Final  . Glucose, Bld 10/31/2016 153* 70 - 99 mg/dL Final  . BUN 10/31/2016 8  6 - 23 mg/dL Final  . Creatinine, Ser 10/31/2016 0.70  0.40 - 1.20 mg/dL Final  . Calcium 10/31/2016 9.3  8.4 - 10.5 mg/dL Final  . GFR 10/31/2016 128.62  >60.00 mL/min Final  . Microalb, Ur 10/31/2016 2.3* 0.0 - 1.9 mg/dL Final  . Creatinine,U 10/31/2016 164.1  mg/dL Final  . Microalb Creat Ratio 10/31/2016 1.4  0.0 - 30.0 mg/g Final    Physical Examination:  BP 120/70   Pulse 81   Ht _0  (1.676 m)   Wt 205 lb (93 kg)   SpO2 99%   BMI 33.09 kg/m   She has generalized obesity  ASSESSMENT:  Diabetes previously presenting with ketoacidosis Early pregnancy  Currently on no treatment, off insulin for about 6 months Most likely she does have only type II diabetes presenting as ketoacidosis initially  A1c is 6.8 which is inadequate for her first trimester of pregnancy Minimal glucose monitoring being done and not clear how often she is checking her blood sugars, however she thinks her blood sugars are fairly good at home Glucose in the lab is high at 153 nonfasting She still has inadequate knowledge of meal planning Currently not motivated to exercise  Although she previously had reported nausea with metformin and this was regular metformin and has not tried metformin ER   PLAN:   Start home glucose monitoring, given Accu-Chek guide monitor.  She will start monitoring that sugars at least twice a day with some readings fasting and some 2 hours after meals  Needs to bring monitor for download on each visit  Start walking program for exercise.  Consultation with dietitian for meal planning  Trial of metformin ER, starting with 500 mg daily with her main meal at lunch and then if tolerated increasing to twice a day.  If her blood sugars are monitored  target or if she is unable to tolerate metformin may consider low-dose glyburide  Follow-up with diabetes educator also for general education  She will need follow-up in 3 weeks and regularly thereafter  Patient Instructions  Check blood  sugars on waking up  Every 2 days  Also check blood sugars about 2 hours after a meal and do this after different meals by rotation  Recommended blood sugar levels on waking up is 60-90 and about 2 hours after meal is 100-130  Please bring your blood sugar monitor to each visit, thank you  WAlk daily  Start taking metformin ER 500 mg right after finishing lunch, if able to tolerate this increase it to twice a day after 1 week.  Call us if there is a problem with taking the medication     Counseling time on subjects discussed above is over 50% of today's 25 minute visit    Angela Henderson 11/03/2016, 8:12 PM   Note: This note was prepared with Dragon voice recognition system technology. Any transcriptional errors that result from this process are unintentional.

## 2016-11-11 ENCOUNTER — Encounter: Payer: Medicaid Other | Admitting: Nutrition

## 2016-11-12 ENCOUNTER — Encounter: Payer: Medicaid Other | Admitting: Nutrition

## 2016-11-17 NOTE — L&D Delivery Note (Signed)
Delivery Note Patient pushed for approximately one hour after she was noted to be C/C/+1.  At 9:01 AM a viable and healthy female was delivered via Vaginal, Spontaneous Delivery (Presentation: OA ).  Over intact perineum.  Shoulders and body easily delivered. Baby initially laid on maternal abdomen, but was noted to be having difficulty with respirations so was brought to the warmer for RN evaluation. Pediatricians were called to evaluate the baby.  APGAR: 6, 8; weight pending Placenta was delivered spontaneously status: intact.  Cord: 3 vessels with the following complications: PPH.  Patient with right side wall laceration and second degree perineal laceration repaired with 2-0 vicryl and 3-0 chromic.  Uterine atony was alleviated with massage and IV pitocin. Patient tolerated procedure well.   Anesthesia:   Episiotomy: None Lacerations: 2nd degree;Perineal;Vaginal Suture Repair: 2.0 vicryl Est. Blood Loss (mL): 500  Mom to postpartum.  Baby to Couplet care / Skin to Skin.  Essie HartINN, Alyne Martinson STACIA 05/16/2017, 9:44 AM

## 2016-11-27 ENCOUNTER — Encounter: Payer: Medicaid Other | Admitting: Dietician

## 2016-11-27 ENCOUNTER — Other Ambulatory Visit: Payer: Self-pay | Admitting: Endocrinology

## 2016-11-27 DIAGNOSIS — E1165 Type 2 diabetes mellitus with hyperglycemia: Secondary | ICD-10-CM

## 2016-11-28 ENCOUNTER — Other Ambulatory Visit (INDEPENDENT_AMBULATORY_CARE_PROVIDER_SITE_OTHER): Payer: Medicaid Other

## 2016-11-28 DIAGNOSIS — E1165 Type 2 diabetes mellitus with hyperglycemia: Secondary | ICD-10-CM

## 2016-11-28 LAB — GLUCOSE, RANDOM: Glucose, Bld: 105 mg/dL — ABNORMAL HIGH (ref 70–99)

## 2016-11-29 LAB — FRUCTOSAMINE: Fructosamine: 251 umol/L (ref 0–285)

## 2016-12-03 ENCOUNTER — Ambulatory Visit: Payer: Medicaid Other | Admitting: Endocrinology

## 2016-12-16 ENCOUNTER — Encounter: Payer: Self-pay | Admitting: Family Medicine

## 2016-12-16 ENCOUNTER — Ambulatory Visit: Payer: Medicaid Other | Attending: Family Medicine | Admitting: Family Medicine

## 2016-12-16 VITALS — BP 119/72 | HR 79 | Temp 98.1°F | Resp 18 | Ht 66.0 in | Wt 216.2 lb

## 2016-12-16 DIAGNOSIS — Z0000 Encounter for general adult medical examination without abnormal findings: Secondary | ICD-10-CM | POA: Insufficient documentation

## 2016-12-16 DIAGNOSIS — G44209 Tension-type headache, unspecified, not intractable: Secondary | ICD-10-CM | POA: Insufficient documentation

## 2016-12-16 DIAGNOSIS — O24112 Pre-existing diabetes mellitus, type 2, in pregnancy, second trimester: Secondary | ICD-10-CM | POA: Insufficient documentation

## 2016-12-16 DIAGNOSIS — Z833 Family history of diabetes mellitus: Secondary | ICD-10-CM | POA: Insufficient documentation

## 2016-12-16 DIAGNOSIS — E119 Type 2 diabetes mellitus without complications: Secondary | ICD-10-CM | POA: Insufficient documentation

## 2016-12-16 DIAGNOSIS — O99352 Diseases of the nervous system complicating pregnancy, second trimester: Secondary | ICD-10-CM | POA: Insufficient documentation

## 2016-12-16 DIAGNOSIS — Z7984 Long term (current) use of oral hypoglycemic drugs: Secondary | ICD-10-CM | POA: Diagnosis not present

## 2016-12-16 DIAGNOSIS — J3089 Other allergic rhinitis: Secondary | ICD-10-CM | POA: Diagnosis not present

## 2016-12-16 DIAGNOSIS — Z8249 Family history of ischemic heart disease and other diseases of the circulatory system: Secondary | ICD-10-CM | POA: Diagnosis not present

## 2016-12-16 DIAGNOSIS — Z3A16 16 weeks gestation of pregnancy: Secondary | ICD-10-CM | POA: Insufficient documentation

## 2016-12-16 LAB — LIPID PANEL
CHOLESTEROL: 131 mg/dL (ref <200)
HDL: 39 mg/dL — ABNORMAL LOW (ref >50)
LDL Cholesterol: 65 mg/dL (ref <100)
Total CHOL/HDL Ratio: 3.4 Ratio (ref <5.0)
Triglycerides: 134 mg/dL (ref <150)
VLDL: 27 mg/dL (ref <30)

## 2016-12-16 LAB — CBC WITH DIFFERENTIAL/PLATELET
BASOS PCT: 0 %
Basophils Absolute: 0 cells/uL (ref 0–200)
Eosinophils Absolute: 105 cells/uL (ref 15–500)
Eosinophils Relative: 1 %
HEMATOCRIT: 31.5 % — AB (ref 35.0–45.0)
Hemoglobin: 10.1 g/dL — ABNORMAL LOW (ref 11.7–15.5)
Lymphocytes Relative: 14 %
Lymphs Abs: 1470 cells/uL (ref 850–3900)
MCH: 26.8 pg — ABNORMAL LOW (ref 27.0–33.0)
MCHC: 32.1 g/dL (ref 32.0–36.0)
MCV: 83.6 fL (ref 80.0–100.0)
MONO ABS: 630 {cells}/uL (ref 200–950)
MONOS PCT: 6 %
MPV: 9.8 fL (ref 7.5–12.5)
Neutro Abs: 8295 cells/uL — ABNORMAL HIGH (ref 1500–7800)
Neutrophils Relative %: 79 %
PLATELETS: 281 10*3/uL (ref 140–400)
RBC: 3.77 MIL/uL — AB (ref 3.80–5.10)
RDW: 16.5 % — AB (ref 11.0–15.0)
WBC: 10.5 10*3/uL (ref 3.8–10.8)

## 2016-12-16 LAB — BASIC METABOLIC PANEL WITH GFR
BUN: 5 mg/dL — ABNORMAL LOW (ref 7–25)
CHLORIDE: 105 mmol/L (ref 98–110)
CO2: 20 mmol/L (ref 20–31)
CREATININE: 0.68 mg/dL (ref 0.50–1.10)
Calcium: 9.3 mg/dL (ref 8.6–10.2)
GFR, Est African American: >89 mL/min (ref >=60)
GFR, Est Non African American: >89 mL/min (ref >=60)
Glucose, Bld: 154 mg/dL — ABNORMAL HIGH (ref 65–99)
Potassium: 4.1 mmol/L (ref 3.5–5.3)
SODIUM: 135 mmol/L (ref 135–146)

## 2016-12-16 LAB — TSH: TSH: 2.31 m[IU]/L

## 2016-12-16 LAB — GLUCOSE, POCT (MANUAL RESULT ENTRY): POC GLUCOSE: 154 mg/dL — AB (ref 70–99)

## 2016-12-16 MED ORDER — METFORMIN HCL ER 500 MG PO TB24: ORAL_TABLET | ORAL | 1 refills | Status: DC

## 2016-12-16 MED ORDER — ACCU-CHEK SOFTCLIX LANCET DEV MISC: 0 refills | Status: DC

## 2016-12-16 MED ORDER — ACCU-CHEK AVIVA PLUS W/DEVICE KIT: 1.0000 | PACK | Freq: Once | 0 refills | Status: AC

## 2016-12-16 MED ORDER — GLUCOSE BLOOD VI STRP: ORAL_STRIP | 12 refills | Status: DC

## 2016-12-16 MED ORDER — LORATADINE 10 MG PO TABS: 10.0000 mg | ORAL_TABLET | Freq: Every day | ORAL | 2 refills | Status: DC

## 2016-12-16 MED ORDER — ACETAMINOPHEN 500 MG PO TABS: 500.0000 mg | ORAL_TABLET | Freq: Four times a day (QID) | ORAL | 0 refills | Status: DC | PRN

## 2016-12-16 NOTE — Progress Notes (Signed)
Patient is here for a physical today  Patient is [redacted] weeks pregnant \  Patient complains about horrible headaches

## 2016-12-16 NOTE — Patient Instructions (Signed)
Schedule appointment with lab week of March 15th for Hemoglobin A1c check .   Type 1 or Type 2 Diabetes Mellitus During Pregnancy, Self Care When you have type 1 or type 2 diabetes (diabetes mellitus), you must keep your blood sugar (glucose) under control. You can do this with:  Nutrition.  Exercise.  Lifestyle changes.  Insulin or medicines, if needed.  Support from your doctors and others. How do I manage my blood sugar?  Check your blood sugar every day, as often as told.  Call your doctor if your blood sugar is above your goal numbers for 2 tests in a row.  Have your A1c (hemoglobin A1c) level checked at least two times a year. Have it checked more often if your doctor tells you to do that. Your doctor will set treatment goals for you. In general, you should have these blood sugar levels:  After not eating for a long time (fasting): 95 mg/dL (5.3 mmol/L).  After meals (postprandial):  One hour after a meal: at or below 140 mg/dL (7.8 mmol/L).  Two hours after a meal: at or below 120 mg/dL (6.7 mmol/L).  A1c level: 6-6.5%. What do I need to know about high blood sugar? High blood sugar is called hyperglycemia. Know the signs of high blood sugar. Signs may include:  Feeling:  Thirsty.  Hungry.  Very tired.  Needing to pee (urinate) more than usual.  Blurry vision. What do I need to know about low blood sugar? Low blood sugar is called hypoglycemia. This is when blood sugar is at or below 70 mg/dL (3.9 mmol/L). Symptoms may include:  Feeling:  Hungry.  Worried or nervous (anxious).  Sweaty and clammy.  Confused.  Dizzy.  Sleepy.  Sick to your stomach (nauseous).  Having:  A fast heartbeat.  A headache.  A change in your vision.  Jerky movements that you cannot control (seizure).  Nightmares.  Tingling or no feeling (numbness) around the mouth, lips, or tongue.  Having trouble with:  Talking.  Paying attention  (concentrating).  Moving (coordination).  Sleeping.  Shaking.  Passing out (fainting).  Getting upset easily (irritability). Treating low blood sugar  To treat low blood sugar, eat or drink something sugary right away. If you can think clearly and swallow safely, follow the 15:15 rule:  Take 15 grams of a fast-acting carb (carbohydrate). Some fast-acting carbs are:  1 tube of glucose gel.  3 sugar tablets (glucose pills).  6-8 pieces of hard candy.  4 oz (120 mL) of fruit juice.  4 oz (120 mL) regular (not diet) soda.  Check your blood sugar 15 minutes after you take the carb.  If your blood sugar is still at or below 70 mg/dL (3.9 mmol/L), take 15 grams of a carb again.  If your blood sugar does not go above 70 mg/dL (3.9 mmol/L) after 3 tries, get help right away.  After your blood sugar goes back to normal, eat a meal or a snack within 1 hour. Treating very low blood sugar  If your blood sugar is at or below 54 mg/dL (3 mmol/L), you have very low blood sugar (severe hypoglycemia). This is an emergency. Do not wait to see if the symptoms will go away. Get medical help right away. Call your local emergency services (911 in the [place] Do not drive yourself to the hospital. If you have very low blood sugar and you cannot eat or drink, you may need a glucagon shot (injection). A family member or  friend should learn:  How to check your blood sugar.  How to give you a glucagon shot. Ask your doctor if you need a glucagon shot kit at home. What else is important to manage my diabetes? Medicine  Follow these instructions about insulin and diabetes medicines:  Take them as told by your doctor.  Adjust them as told by your doctor.  Do not run out of them. Having diabetes can put you at risk for other long-term (chronic) conditions. These may include heart disease and kidney disease. Your doctor may prescribe medicines to help prevent problems from diabetes. Food   Make  healthy food choices. These include:  Chicken, fish, egg whites, and beans.  Oats, whole wheat, bulgur, brown rice, quinoa, and millet.  Fresh fruits and vegetables.  Low-fat dairy products.  Nuts, avocado, olive oil, and canola oil.  Meet with a food specialist (dietitian) to make an eating plan that is right for you.  Follow instructions from your doctor about what you cannot eat or drink.  Drink enough fluid to keep your pee (urine) clear or pale yellow.  Eat healthy snacks between healthy meals.  Keep track of the carbs you eat. Read food labels. Learn the standard serving sizes of foods.  Follow your sick day plan when you cannot eat or drink normally. Make this plan with your doctor so it is ready. Activity  Exercise for 30 minutes or more a day during your pregnancy or as much as told by your doctor.  Talk with your doctor before you start a new exercise. Your doctor may need to adjust your insulin, medicines, or food. Lifestyle   Do not drink alcohol.  Do not use any tobacco products, such as cigarettes, chewing tobacco, and e-cigarettes. If you need help quitting, ask your doctor.  Learn how to deal with stress. If you need help with this, ask your doctor. Body care  Stay up to date with your shots (immunizations).  Get an eye exam during your first trimester.  Check your skin and feet every day. Check for cuts, bruises, redness, blisters, or sores.  Get regular foot exams as told by your doctor.  Brush your teeth and gums two times a day. Floss at least one time a day.  Go to the dentist least once every 6 months.  Stay at a healthy weight during your pregnancy. General instructions   Take over-the-counter and prescription medicines only as told by your doctor.  Talk with your doctor about your risk for high blood pressure during pregnancy (preeclampsia or eclampsia).  Share your diabetes care plan with:  Your work or school.  People you live  with.  Check your pee for ketones:  When you are sick.  As told by your doctor.  Carry a card or wear jewelry that says that you have diabetes.  Ask your doctor:  Do I need to meet with a diabetes educator?  Where can I find a support group for people with diabetes?  Keep all follow-up visits with your doctor. This is important. Where to find more information: To learn more about diabetes, visit:  American Diabetes Association: www.diabetes.org  American Association of Diabetes Educators (AADE): www.diabeteseducator.org/patient-resources This information is not intended to replace advice given to you by your health care provider. Make sure you discuss any questions you have with your health care provider. Document Released: 02/25/2016 Document Revised: 04/10/2016 Document Reviewed: 12/07/2015 Elsevier Interactive Patient Education  2017 [employer].

## 2016-12-16 NOTE — Progress Notes (Signed)
Subjective:   Patient ID: Angela Henderson, female    DOB: 1991/08/18, 26 y.o.   MRN: [id number]  Chief Complaint  Patient presents with  . Establish Care   HPI Angela Henderson 26 y.o. female presents for annual physical examination. She reports being [redacted] weeks pregnant and is being followed by a ob/gyn. History of DM reports being diagnosed almost 2 years ago. Reports mother is type 1 DM and father is type DM 2. Reports not checking her blood sugars due to cost of diabetic supplies for current meter. When she was checking her blood sugars, she reports CBG's  was ranging in the 120's. Reports history of headache that began today. Denies any N/V, vision changes, or photophobia. Reports not taking anything for symptoms. Denies symptoms of all other pertinent systems.  Past Medical History:  Diagnosis Date  . DKA, type 2 ([place]) 1st admission 11/02/2015  . Hyperlipidemia   . Type II diabetes mellitus ([place]) dx'd 09/2015   mother and grandmother are type 1, dad type 2    Past Surgical History:  Procedure Laterality Date  . NO PAST SURGERIES      Family History  Problem Relation Age of Onset  . Diabetes Mother     Type I  . Hypertension Mother   . Diabetes Father   . Hypertension Father   . Diabetes Cousin     Type I  . Cancer Maternal Aunt     Social History   Social History  . Marital status: Single    Spouse name: N/A  . Number of children: N/A  . Years of education: N/A   Occupational History  . Not on file.   Social History Main Topics  . Smoking status: Never Smoker  . Smokeless tobacco: Never Used  . Alcohol use 0.0 oz/week     Comment: 11/02/2015 "1-2 drinks on weekend maybe once/month"  . Drug use: No  . Sexual activity: Yes   Other Topics Concern  . Not on file   Social History Narrative  . No narrative on file    Outpatient Medications Prior to Visit  Medication Sig Dispense Refill  . Blood Glucose Monitoring Suppl (BAYER CONTOUR LINK MONITOR)  w/Device KIT Test blood sugar tid and QHS 1 kit 0  . glucose blood (ACCU-CHEK GUIDE) test strip Use to test blood sugar 3 times daily 100 each 5  . metFORMIN (GLUCOPHAGE-XR) 500 MG 24 hr tablet Take 2 tablets daily 60 tablet 2  . insulin starter kit- syringes MISC 1 kit by Other route once. (Patient not taking: Reported on 12/16/2016) 1 kit 0  . Insulin Syringe-Needle U-100 31G X 1/4" 0.3 ML MISC 1 each by Does not apply route at bedtime. Use to inject insulin daily. Dx code E11.9 (Patient not taking: Reported on 12/16/2016) 100 each 0   No facility-administered medications prior to visit.     No Known Allergies  Review of Systems  Constitutional: Negative.   HENT: Negative.   Eyes: Negative.   Respiratory: Negative.   Cardiovascular: Negative.   Gastrointestinal: Negative.   Genitourinary: Negative.   Musculoskeletal: Negative.   Skin: Negative.   Neurological: Positive for headaches.  Endo/Heme/Allergies: Negative.   Psychiatric/Behavioral: Negative.        Objective:    Physical Exam  Constitutional: She is oriented to person, place, and time. She appears well-developed and well-nourished.  HENT:  Head: Normocephalic and atraumatic.  Right Ear: External ear normal.  Left Ear: External ear normal.  Nose:  Nose normal.  Mouth/Throat: Oropharynx is clear and moist.  Eyes: Conjunctivae and EOM are normal. Pupils are equal, round, and reactive to light.  Neck: Normal range of motion. Neck supple.  Cardiovascular: Normal rate, regular rhythm, normal heart sounds and intact distal pulses.   Pulmonary/Chest: Effort normal and breath sounds normal.  Abdominal: Soft. Bowel sounds are normal.  Musculoskeletal: Normal range of motion.  Neurological: She is alert and oriented to person, place, and time. She has normal reflexes.  Skin: Skin is warm and dry.  Psychiatric: She has a normal mood and affect. Her behavior is normal. Thought content normal.  Nursing note and vitals  reviewed.   BP 119/72 (BP Location: Left Arm, Patient Position: Sitting, Cuff Size: Normal)   Pulse 79   Temp 98.1 F (36.7 C) (Oral)   Resp 18   Ht _0  (1.676 m)   Wt 216 lb 3.2 oz (98.1 kg)   LMP 08/22/2016   SpO2 100%   BMI 34.90 kg/m  Wt Readings from Last 3 Encounters:  12/16/16 216 lb 3.2 oz (98.1 kg)  11/03/16 205 lb (93 kg)  11/20/15 203 lb 12.8 oz (92.4 kg)    Immunization History  Administered Date(s) Administered  . Influenza Whole 11/01/2007, 08/24/2008  . Influenza-Unspecified 09/18/2015  . Pneumococcal Polysaccharide-23 11/03/2015  . Td 07/03/2010    Diabetic Foot Exam - Simple   No data filed      Lab Results  Component Value Date   TSH 2.31 12/16/2016   Lab Results  Component Value Date   WBC 14.1 (H) 12/17/2016   HGB 9.9 (L) 12/17/2016   HCT 31.3 (L) 12/17/2016   MCV 83.9 12/17/2016   PLT 281 12/17/2016   Lab Results  Component Value Date   NA 133 (L) 12/17/2016   K 3.4 (L) 12/17/2016   CO2 20 (L) 12/17/2016   GLUCOSE 187 (H) 12/17/2016   BUN 5 (L) 12/17/2016   CREATININE 0.70 12/17/2016   BILITOT 0.5 12/17/2016   ALKPHOS 37 (L) 12/17/2016   AST 26 12/17/2016   ALT 26 12/17/2016   PROT 7.3 12/17/2016   ALBUMIN 3.6 12/17/2016   CALCIUM 9.5 12/17/2016   ANIONGAP 11 12/17/2016   GFR 128.62 10/31/2016   Lab Results  Component Value Date   CHOL 131 12/16/2016   CHOL 153 07/03/2010   Lab Results  Component Value Date   HDL 39 (L) 12/16/2016   HDL 36 (L) 07/03/2010   Lab Results  Component Value Date   LDLCALC 65 12/16/2016   LDLCALC 106 (H) 07/03/2010   Lab Results  Component Value Date   TRIG 134 12/16/2016   TRIG 53 07/03/2010   Lab Results  Component Value Date   CHOLHDL 3.4 12/16/2016   CHOLHDL 4.3 Ratio 07/03/2010   Lab Results  Component Value Date   HGBA1C 6.8 (H) 10/31/2016   HGBA1C 12.5 (H) 11/02/2015      Assessment & Plan:   Problem List Items Addressed This Visit      Endocrine   Type II  diabetes mellitus ([place])   Relevant Medications   metFORMIN (GLUCOPHAGE-XR) 500 MG 24 hr tablet   glucose blood test strip   Lancet Devices (ACCU-CHEK SOFTCLIX) lancets   Other Relevant Orders   Glucose (CBG) (Completed)   Ambulatory referral to Ophthalmology   Hemoglobin A1C   Lipid Panel (Completed)    Other Visit Diagnoses    Annual physical exam    -  Primary   Last hgba1c  was less than 3 months ago schedule another in March.   Relevant Orders   BASIC METABOLIC PANEL WITH GFR (Completed)   Ambulatory referral to Ophthalmology   CBC with Differential (Completed)   TSH (Completed)   VITAMIN D 25 Hydroxy (Vit-D Deficiency, Fractures) (Completed)   Lipid Panel (Completed)   Acute non intractable tension-type headache       Relevant Medications   acetaminophen (TYLENOL) 500 MG tablet   Chronic non-seasonal allergic rhinitis, unspecified trigger       Relevant Medications   loratadine (CLARITIN) 10 MG tablet      Meds ordered this encounter  Medications  . acetaminophen (TYLENOL) 500 MG tablet    Sig: Take 1 tablet (500 mg total) by mouth every 6 (six) hours as needed for mild pain, moderate pain or headache.    Dispense:  60 tablet    Refill:  0    Order Specific Question:   Supervising Provider    Answer:   Tresa Garter [id number]  . metFORMIN (GLUCOPHAGE-XR) 500 MG 24 hr tablet    Sig: Take 2 tablets daily    Dispense:  60 tablet    Refill:  1    Order Specific Question:   Supervising Provider    Answer:   Tresa Garter [id number]  . loratadine (CLARITIN) 10 MG tablet    Sig: Take 1 tablet (10 mg total) by mouth daily.    Dispense:  30 tablet    Refill:  2    Order Specific Question:   Supervising Provider    Answer:   Tresa Garter [id number]  . Blood Glucose Monitoring Suppl (ACCU-CHEK AVIVA PLUS) w/Device KIT    Sig: 1 kit by Does not apply route once.    Dispense:  1 kit    Refill:  0    Order Specific Question:   Supervising Provider     Answer:   Tresa Garter [id number]  . glucose blood test strip    Sig: Use as instructed    Dispense:  100 each    Refill:  12    Order Specific Question:   Supervising Provider    Answer:   Tresa Garter [[id number]]  . Lancet Devices (ACCU-CHEK SOFTCLIX) lancets    Sig: Use as instructed    Dispense:  1 each    Refill:  0    Order Specific Question:   Supervising Provider    Answer:   Tresa Garter [[id number]]      Fredia Beets, FNP

## 2016-12-17 ENCOUNTER — Emergency Department (HOSPITAL_COMMUNITY)
Admission: EM | Admit: 2016-12-17 | Discharge: 2016-12-17 | Disposition: A | Discharge status: Home / Self Care | Payer: Medicaid Other | Attending: Emergency Medicine | Admitting: Emergency Medicine

## 2016-12-17 ENCOUNTER — Encounter (HOSPITAL_COMMUNITY): Payer: Self-pay

## 2016-12-17 ENCOUNTER — Encounter: Payer: Self-pay | Admitting: Family Medicine

## 2016-12-17 DIAGNOSIS — O9989 Other specified diseases and conditions complicating pregnancy, childbirth and the puerperium: Secondary | ICD-10-CM | POA: Diagnosis present

## 2016-12-17 DIAGNOSIS — O26892 Other specified pregnancy related conditions, second trimester: Secondary | ICD-10-CM

## 2016-12-17 DIAGNOSIS — D508 Other iron deficiency anemias: Secondary | ICD-10-CM

## 2016-12-17 DIAGNOSIS — Z3A16 16 weeks gestation of pregnancy: Secondary | ICD-10-CM | POA: Insufficient documentation

## 2016-12-17 DIAGNOSIS — R51 Headache: Secondary | ICD-10-CM | POA: Insufficient documentation

## 2016-12-17 DIAGNOSIS — Z794 Long term (current) use of insulin: Secondary | ICD-10-CM | POA: Diagnosis not present

## 2016-12-17 DIAGNOSIS — E119 Type 2 diabetes mellitus without complications: Secondary | ICD-10-CM | POA: Diagnosis not present

## 2016-12-17 DIAGNOSIS — O99012 Anemia complicating pregnancy, second trimester: Secondary | ICD-10-CM | POA: Insufficient documentation

## 2016-12-17 LAB — CBC WITH DIFFERENTIAL/PLATELET
Basophils Absolute: 0 10*3/uL (ref 0.0–0.1)
Basophils Relative: 0 %
EOS ABS: 0.1 10*3/uL (ref 0.0–0.7)
EOS PCT: 0 %
HCT: 31.3 % — ABNORMAL LOW (ref 36.0–46.0)
Hemoglobin: 9.9 g/dL — ABNORMAL LOW (ref 12.0–15.0)
LYMPHS ABS: 1.9 10*3/uL (ref 0.7–4.0)
Lymphocytes Relative: 13 %
MCH: 26.5 pg (ref 26.0–34.0)
MCHC: 31.6 g/dL (ref 30.0–36.0)
MCV: 83.9 fL (ref 78.0–100.0)
Monocytes Absolute: 0.9 10*3/uL (ref 0.1–1.0)
Monocytes Relative: 6 %
Neutro Abs: 11.4 10*3/uL — ABNORMAL HIGH (ref 1.7–7.7)
Neutrophils Relative %: 81 %
PLATELETS: 281 10*3/uL (ref 150–400)
RBC: 3.73 MIL/uL — ABNORMAL LOW (ref 3.87–5.11)
RDW: 15.9 % — ABNORMAL HIGH (ref 11.5–15.5)
WBC: 14.1 10*3/uL — ABNORMAL HIGH (ref 4.0–10.5)

## 2016-12-17 LAB — COMPREHENSIVE METABOLIC PANEL
ALK PHOS: 37 U/L — AB (ref 38–126)
ALT: 26 U/L (ref 14–54)
ANION GAP: 11 (ref 5–15)
AST: 26 U/L (ref 15–41)
Albumin: 3.6 g/dL (ref 3.5–5.0)
BUN: 5 mg/dL — ABNORMAL LOW (ref 6–20)
CALCIUM: 9.5 mg/dL (ref 8.9–10.3)
CHLORIDE: 102 mmol/L (ref 101–111)
CO2: 20 mmol/L — AB (ref 22–32)
Creatinine, Ser: 0.7 mg/dL (ref 0.44–1.00)
GFR calc non Af Amer: >60 mL/min (ref >60)
Glucose, Bld: 187 mg/dL — ABNORMAL HIGH (ref 65–99)
Potassium: 3.4 mmol/L — ABNORMAL LOW (ref 3.5–5.1)
SODIUM: 133 mmol/L — AB (ref 135–145)
Total Bilirubin: 0.5 mg/dL (ref 0.3–1.2)
Total Protein: 7.3 g/dL (ref 6.5–8.1)

## 2016-12-17 LAB — URINALYSIS, ROUTINE W REFLEX MICROSCOPIC
Bilirubin Urine: NEGATIVE
HGB URINE DIPSTICK: NEGATIVE
Ketones, ur: 15 mg/dL — AB
LEUKOCYTES UA: NEGATIVE
Nitrite: NEGATIVE
Protein, ur: NEGATIVE mg/dL
Specific Gravity, Urine: 1.02 (ref 1.005–1.030)
pH: 6 (ref 5.0–8.0)

## 2016-12-17 LAB — CBG MONITORING, ED: Glucose-Capillary: 144 mg/dL — ABNORMAL HIGH (ref 65–99)

## 2016-12-17 LAB — URINALYSIS, MICROSCOPIC (REFLEX): RBC / HPF: NONE SEEN RBC/hpf (ref 0–5)

## 2016-12-17 LAB — VITAMIN D 25 HYDROXY (VIT D DEFICIENCY, FRACTURES): VIT D 25 HYDROXY: 12 ng/mL — AB (ref 30–100)

## 2016-12-17 MED ORDER — METOCLOPRAMIDE HCL 5 MG/ML IJ SOLN
10.0000 mg | Freq: Once | INTRAMUSCULAR | Status: AC
  Administered 2016-12-17: 10 mg via INTRAVENOUS
  Filled 2016-12-17: qty 2

## 2016-12-17 MED ORDER — ACETAMINOPHEN 325 MG PO TABS
650.0000 mg | ORAL_TABLET | Freq: Once | ORAL | Status: AC
  Administered 2016-12-17: 650 mg via ORAL
  Filled 2016-12-17: qty 2

## 2016-12-17 MED ORDER — DIPHENHYDRAMINE HCL 50 MG/ML IJ SOLN
25.0000 mg | Freq: Once | INTRAMUSCULAR | Status: AC
  Administered 2016-12-17: 25 mg via INTRAVENOUS
  Filled 2016-12-17: qty 1

## 2016-12-17 MED ORDER — SODIUM CHLORIDE 0.9 % IV BOLUS (SEPSIS)
1000.0000 mL | Freq: Once | INTRAVENOUS | Status: AC
  Administered 2016-12-17: 1000 mL via INTRAVENOUS

## 2016-12-17 NOTE — Discharge Instructions (Signed)
Your blood sugar was elevated today. It is important that you follow up with the doctor that prescribes your diabetic medication and discuss your blood sugars. Be sure to check your blood sugar regularly at home and report your findings to your doctor. Keep your next regularly scheduled OB appointment. [facility]

## 2016-12-17 NOTE — ED Notes (Signed)
Pt verbalized understanding of d/c instructions and has no further questions. Pt stable and NAD. Neuro intact. Pt to follow up with obgyn and endocrinologist.

## 2016-12-17 NOTE — ED Notes (Signed)
CBG 144 

## 2016-12-17 NOTE — ED Triage Notes (Signed)
Pt complaining of headache since yesterday morning. Pt states pain in temporal area. Pt states no hx of migraines. Pt states sensitivity to light and sound. Pt states [redacted] weeks pregnant. Pt denies any abdominal discomfort.

## 2016-12-17 NOTE — ED Provider Notes (Signed)
[facility] DEPT Provider Note   CSN: [id number] Arrival date & time: 12/17/16  1815   By signing my name below, I, Dolores Hoose, attest that this documentation has been prepared under the direction and in the presence of non-physician practitioner, Debroah Baller, NP. Electronically Signed: Dolores Hoose, Scribe. 12/17/2016. 7:52 PM.   History   Chief Complaint Chief Complaint  Patient presents with  . Migraine   The history is provided by the patient. No language interpreter was used.  Headache   This is a new problem. The current episode started yesterday. The problem occurs constantly. The problem has not changed since onset.The headache is associated with bright light and loud noise. The pain is located in the frontal region. The quality of the pain is described as dull. The pain is at a severity of 10/10. The pain is moderate. The pain does not radiate. Associated symptoms include nausea. Pertinent negatives include no fever and no vomiting. She has tried acetaminophen for the symptoms. The treatment provided no relief.    HPI Comments:  Angela Henderson is a 26 y.o. G1P0 @ 72w5dgestation female with pmhx of DM who presents to the Emergency Department complaining of constant, moderate frontal headache beginning yesterday morning. Pt describes her headache as a 10/10 pain. She reports associated nausea, photophobia and sinus pressure. Pt has tried tylenol at home with no relief of symptoms. She denies any vomiting, dysuria, frequency, neck pain, abdominal pain, fever, chills, cough, ear pain, LE swelling or back pain. P/o intake normal. She denies any sick contacts.   Past Medical History:  Diagnosis Date  . DKA, type 2 ([facility] 1st admission 11/02/2015  . Hyperlipidemia   . Type II diabetes mellitus ([facility] dx'd 09/2015   mother and grandmother are type 1, dad type 2    Patient Active Problem List   Diagnosis Date Noted  . Type II diabetes mellitus ([facility] 12/16/2016  . Morbid  obesity ([facility] 11/02/2015  . AKI (acute kidney injury) ([facility] 11/02/2015  . Hyperglycemia   . ANEMIA 08/06/2010  . SHORTNESS OF BREATH 08/06/2010  . FREQUENCY, URINARY 08/06/2010  . FATIGUE 05/30/2008  . ANEMIA-IRON DEFICIENCY 11/04/2007    Past Surgical History:  Procedure Laterality Date  . NO PAST SURGERIES      OB History    Gravida Para Term Preterm AB Living   1             SAB TAB Ectopic Multiple Live Births                   Home Medications    Prior to Admission medications   Medication Sig Start Date End Date Taking? Authorizing Provider  acetaminophen (TYLENOL) 500 MG tablet Take 1 tablet (500 mg total) by mouth every 6 (six) hours as needed for mild pain, moderate pain or headache. 12/16/16   MAlfonse Spruce FNP  Blood Glucose Monitoring Suppl (BAYER CONTOUR LINK MONITOR) w/Device KIT Test blood sugar tid and QHS 12/24/15   VLance Bosch NP  glucose blood (ACCU-CHEK GUIDE) test strip Use to test blood sugar 3 times daily 11/03/16   AElayne Snare MD  glucose blood test strip Use as instructed 12/16/16   MAlfonse Spruce FNP  insulin starter kit- syringes MISC 1 kit by Other route once. Patient not taking: Reported on 12/16/2016 11/03/15   NMaryellen Pile MD  Insulin Syringe-Needle U-100 31G X 1/4" 0.3 ML MISC 1 each by Does not apply route at bedtime. Use to  inject insulin daily. Dx code E11.9 Patient not taking: Reported on 12/16/2016 11/03/15   Milagros Loll, MD  Lancet Devices [facility]) lancets Use as instructed 12/16/16   Alfonse Spruce, FNP  loratadine (CLARITIN) 10 MG tablet Take 1 tablet (10 mg total) by mouth daily. 12/16/16   Alfonse Spruce, FNP  metFORMIN (GLUCOPHAGE-XR) 500 MG 24 hr tablet Take 2 tablets daily 12/16/16   Alfonse Spruce, FNP    Family History Family History  Problem Relation Age of Onset  . Diabetes Mother     Type I  . Hypertension Mother   . Diabetes Father   . Hypertension Father   . Diabetes Cousin      Type I  . Cancer Maternal Aunt     Social History Social History  Substance Use Topics  . Smoking status: Never Smoker  . Smokeless tobacco: Never Used  . Alcohol use 0.0 oz/week     Comment: 11/02/2015 "1-2 drinks on weekend maybe once/month"     Allergies   Patient has no known allergies.   Review of Systems Review of Systems  Constitutional: Negative for chills and fever.  HENT: Positive for sinus pressure. Negative for ear pain.   Eyes: Positive for photophobia. Negative for visual disturbance.  Respiratory: Negative for cough.   Cardiovascular: Negative for leg swelling.  Gastrointestinal: Positive for nausea. Negative for abdominal pain and vomiting.  Genitourinary: Negative for dysuria and frequency.  Musculoskeletal: Negative for back pain and neck pain.  Skin: Negative for rash.  Neurological: Positive for headaches. Negative for syncope and weakness.  Psychiatric/Behavioral: Negative for confusion.     Physical Exam Updated Vital Signs BP 115/66   Pulse 81   Temp 98 F (36.7 C) (Oral)   Resp 16   LMP 08/22/2016   SpO2 100%   Physical Exam  Constitutional: She is oriented to person, place, and time. She appears well-developed and well-nourished. No distress.  HENT:  Head: Normocephalic and atraumatic.  Right Ear: Tympanic membrane and ear canal normal.  Left Ear: Tympanic membrane and ear canal normal.  Nose: No mucosal edema. Right sinus exhibits no maxillary sinus tenderness and no frontal sinus tenderness. Left sinus exhibits no maxillary sinus tenderness and no frontal sinus tenderness.  Mouth/Throat: Uvula is midline, oropharynx is clear and moist and mucous membranes are normal. No posterior oropharyngeal edema or posterior oropharyngeal erythema.  Uvula midline. No edema or erythema.   Eyes: Conjunctivae and EOM are normal. Pupils are equal, round, and reactive to light. No scleral icterus.  Neck: Normal range of motion. Neck supple.  No  meningeal signs. No cervical spinal tenderness.  Cardiovascular: Normal rate and regular rhythm.   Pulmonary/Chest: Effort normal and breath sounds normal.  Abdominal: Soft. Bowel sounds are normal. There is no tenderness.  No CVA tenderness. Gravid at [redacted] weeks gestation, nontender. Doppler FHT's 150  Musculoskeletal: Normal range of motion. She exhibits no edema.  Radial and pedal pulses strong, adequate circulation, good touch sensation.  Neurological: She is alert and oriented to person, place, and time. She has normal strength. No cranial nerve deficit or sensory deficit. She displays a negative Romberg sign. Coordination and gait normal.  Reflex Scores:      Bicep reflexes are 2+ on the right side and 2+ on the left side.      Brachioradialis reflexes are 2+ on the right side and 2+ on the left side.      Patellar reflexes are 2+ on the  right side and 2+ on the left side. Rapid alternating movement without difficulty. Stands on one foot without difficulty.  Skin: Skin is warm and dry.  Psychiatric: She has a normal mood and affect. Her behavior is normal.  Nursing note and vitals reviewed.    ED Treatments / Results  DIAGNOSTIC STUDIES:  Oxygen Saturation is 100% on RA, normal by my interpretation.    COORDINATION OF CARE:  8:07 PM Discussed treatment plan with pt at bedside which includes [place] and pt agreed to plan.  Labs (all labs ordered are listed, but only abnormal results are displayed) Labs Reviewed  CBC WITH DIFFERENTIAL/PLATELET - Abnormal; Notable for the following:       Result Value   WBC 14.1 (*)    RBC 3.73 (*)    Hemoglobin 9.9 (*)    HCT 31.3 (*)    RDW 15.9 (*)    Neutro Abs 11.4 (*)    All other components within normal limits  COMPREHENSIVE METABOLIC PANEL - Abnormal; Notable for the following:    Sodium 133 (*)    Potassium 3.4 (*)    CO2 20 (*)    Glucose, Bld 187 (*)    BUN 5 (*)    Alkaline Phosphatase 37 (*)    All other components within  normal limits  URINALYSIS, ROUTINE W REFLEX MICROSCOPIC - Abnormal; Notable for the following:    APPearance HAZY (*)    Glucose, UA >=500 (*)    Ketones, ur 15 (*)    All other components within normal limits  URINALYSIS, MICROSCOPIC (REFLEX) - Abnormal; Notable for the following:    Bacteria, UA RARE (*)    Squamous Epithelial / LPF 0-5 (*)    All other components within normal limits  CBG MONITORING, ED - Abnormal; Notable for the following:    Glucose-Capillary 144 (*)    All other components within normal limits    Radiology No results found.  Procedures Procedures (including critical care time)  Medications Ordered in ED Medications  sodium chloride 0.9 % bolus 1,000 mL (0 mLs Intravenous Stopped 12/17/16 2134)  metoCLOPramide (REGLAN) injection 10 mg (10 mg Intravenous Given 12/17/16 2047)  diphenhydrAMINE (BENADRYL) injection 25 mg (25 mg Intravenous Given 12/17/16 2047)  acetaminophen (TYLENOL) tablet 650 mg (650 mg Oral Given 12/17/16 2300)   Headache resolved with IV fluids, benadryl and Reglan.   Initial Impression / Assessment and Plan / ED Course  I have reviewed the triage vital signs and the nursing notes.  Pertinent lab results that were available during my care of the patient were reviewed by me and considered in my medical decision making (see chart for details).   Consult with Dr. Rogue Bussing on call for [facility] and she request patient f/u with her endocrinologist for her elevated blood sugar and return to the [facility] office as scheduled.  26 y.o. G1P0 @[redacted]w[redacted]d  gestation stable for d/c with headache resolved with treatment while in the ED. Pt HA treated and improved while in ED.  Presentation is  non concerning for [facility], ICH, Meningitis, or temporal arteritis. Pt is afebrile with no focal neuro deficits, nuchal rigidity, or change in vision. Pt is to follow up with PCP to discuss prophylactic medication. Pt verbalizes understanding and is agreeable with plan to dc.  She will f/u with her OB and will see her endocrinologist this week. She will go to [facility] if she has problems before then.  Final Clinical Impressions(s) / ED Diagnoses   Final  diagnoses:  Other iron deficiency anemia  Headache in pregnancy, antepartum, second trimester    New Prescriptions Discharge Medication List as of 12/17/2016 11:02 PM    I personally performed the services described in this documentation, which was scribed in my presence. The recorded information has been reviewed and is accurate.     [place], NP 12/18/16 [place], MD 12/18/16 [phone]

## 2016-12-19 ENCOUNTER — Ambulatory Visit: Payer: Medicaid Other | Admitting: Endocrinology

## 2016-12-19 ENCOUNTER — Telehealth: Payer: Self-pay | Admitting: Endocrinology

## 2016-12-19 NOTE — Telephone Encounter (Signed)
Patient no showed today's appt. Please advise on how to follow up. °A. No follow up necessary. °B. Follow up urgent. Contact patient immediately. °C. Follow up necessary. Contact patient and schedule visit in ___ days. °D. Follow up advised. Contact patient and schedule visit in ____weeks. ° °

## 2016-12-22 NOTE — Telephone Encounter (Signed)
Please reschedule at the earliest available appointment 

## 2016-12-22 NOTE — Telephone Encounter (Signed)
LM for the pt to call to schedule

## 2016-12-23 ENCOUNTER — Other Ambulatory Visit: Payer: Self-pay | Admitting: Family Medicine

## 2016-12-23 DIAGNOSIS — E559 Vitamin D deficiency, unspecified: Secondary | ICD-10-CM

## 2016-12-23 DIAGNOSIS — D509 Iron deficiency anemia, unspecified: Secondary | ICD-10-CM

## 2016-12-23 MED ORDER — CALCIUM 600-400 MG-UNIT PO CHEW: 1.0000 | CHEWABLE_TABLET | Freq: Every day | ORAL | 2 refills | Status: DC

## 2016-12-23 MED ORDER — FERROUS SULFATE 325 (65 FE) MG PO TBEC: 325.0000 mg | DELAYED_RELEASE_TABLET | Freq: Three times a day (TID) | ORAL | 3 refills | Status: DC

## 2016-12-24 ENCOUNTER — Telehealth: Payer: Self-pay | Admitting: *Deleted

## 2016-12-24 NOTE — Telephone Encounter (Signed)
Medical Assistant left message on patient's home and cell voicemail. Voicemail states to give a call back to Fermina Mishkin with [facility] at [phone].  

## 2016-12-24 NOTE — Telephone Encounter (Signed)
-----   Message from Lizbeth BarkMandesia R Hairston, FNP sent at 12/23/2016  7:15 PM EST ----- Iron levels are low. You have iron deficiency anemia. You have been prescribed ferrous sulfate to help increase your iron levels.  Vitamin D level was low. Vitamin D helps to keep bones strong. You were prescribed a vitamin-d supplement to increase your levels. Follow up with your obgyn for iron deficiency anemia and vitamin d levels.  Thyroid function normal Kidney function normal. Cholesterol levels are normal.

## 2016-12-24 NOTE — Telephone Encounter (Signed)
Patient verified DOB Patient is aware of vitamin d and iron level being low. Patient [facility]. Patient is also aware of kidney, thyroid and cholesterol levels being normal. Patient advised to FU with ob/gyn regarding follow level check. Patient expressed her understanding and had no further questions at this time.

## 2017-02-09 ENCOUNTER — Telehealth: Payer: Self-pay | Admitting: Family Medicine

## 2017-02-09 NOTE — Telephone Encounter (Signed)
[facility] to fill out Pt requested form to be filled out by Wednesday, 3/28 Pt was informed we usually ask 7 to 14 days for paperwork   Pt will pick up form when completed  Paperwork placed in provider's box

## 2017-02-09 NOTE — Telephone Encounter (Signed)
Patient dropped off Medical Evaluation Form for [place] [facility] for PCP to fill out °Pt requested form to be filled out by Wednesday, 3/28 °Pt was informed we usually ask 7 to 14 days for paperwork  ° °Pt will pick up form when completed  °Paperwork placed in provider's box °

## 2017-02-10 NOTE — Telephone Encounter (Signed)
Paperwork completed. Will placed in [facility] mailbox.

## 2017-02-11 NOTE — Telephone Encounter (Signed)
[facility] call to inform patient that paper work is ready to pick up at front desk  Patient was aware and understood

## 2017-02-20 LAB — HEMOGLOBIN A1C: Hemoglobin A1C: 7

## 2017-02-23 DIAGNOSIS — O24119 Pre-existing diabetes mellitus, type 2, in pregnancy, unspecified trimester: Secondary | ICD-10-CM | POA: Diagnosis not present

## 2017-02-26 NOTE — Progress Notes (Deleted)
Patient ID: Angela Henderson, female   DOB: 10-19-1991, 26 y.o.   MRN: [id number]          Reason for Appointment : Follow-up for diabetes  History of Present Illness          Date of diagnosis:   09/2015         PREVIOUS history She had presented to the emergency room with symptoms of nausea and high blood sugars and was found to have a blood sugar of about 350 She was treated with metformin initially but she does not symptomatically worse and was admitted to the hospital overnight for ketoacidosis in 12/16 Metformin was stopped.  She also was reporting nausea with this Although she appeared to require large doses of insulin on her initial consultation in 1/17 she did not follow-up subsequently   Recent history:    CURRENT treatment: None  Glucose patterns, problems identified and current management:    She is not monitoring her blood sugar is apparently her contour test strips are not covered by her insurance  She claims that her blood sugars are in the 90s fasting and no higher than 124 after meals but not clear how often she is checking these.  She is using her father's glucose monitor.  She stopped taking her insulin on her own in June since her sugars were fairly normal and did not follow-up with any physician subsequently  She is not referred here by her gynecologist because of her recent pregnancy, currently [redacted] weeks pregnant  Glucose monitoring:  is being done occasionally with her father's monitor Self-care: The diet that the patient has been following is: None.    Mealtimes are: No breakfast usually, Lunch: 2 PM  Dinner: Variable          Exercise:  none recently, walking a little        Dietician consultation: Most recent: 12/16 in the hospital         CDE consultation: None  Wt Readings from Last 3 Encounters:  12/16/16 216 lb 3.2 oz (98.1 kg)  11/03/16 205 lb (93 kg)  11/20/15 203 lb 12.8 oz (92.4 kg)    Diabetes labs:  Lab Results  Component Value Date     HGBA1C 6.8 (H) 10/31/2016   HGBA1C 12.5 (H) 11/02/2015   Lab Results  Component Value Date   MICROALBUR 2.3 (H) 10/31/2016   LDLCALC 65 12/16/2016   CREATININE 0.70 12/17/2016    Allergies as of 02/27/2017   No Known Allergies     Medication List       Accurate as of 02/26/17  9:15 PM. Always use your most recent med list.          accu-chek softclix lancets Use as instructed   acetaminophen 500 MG tablet Commonly known as:  TYLENOL Take 1 tablet (500 mg total) by mouth every 6 (six) hours as needed for mild pain, moderate pain or headache.   BAYER CONTOUR LINK MONITOR w/Device Kit Test blood sugar tid and QHS   Calcium 600-400 MG-UNIT Chew Chew 1 tablet by mouth daily.   ferrous sulfate 325 (65 FE) MG EC tablet Take 1 tablet (325 mg total) by mouth 3 (three) times daily with meals.   glucose blood test strip Commonly known as:  ACCU-CHEK GUIDE Use to test blood sugar 3 times daily   glucose blood test strip Use as instructed   insulin starter kit- syringes Misc 1 kit by Other route once.   Insulin Syringe-Needle  U-100 31G X 1/4" 0.3 ML Misc 1 each by Does not apply route at bedtime. Use to inject insulin daily. Dx code E11.9   loratadine 10 MG tablet Commonly known as:  CLARITIN Take 1 tablet (10 mg total) by mouth daily.   metFORMIN 500 MG 24 hr tablet Commonly known as:  GLUCOPHAGE-XR Take 2 tablets daily       Allergies: No Known Allergies  Past Medical History:  Diagnosis Date  . DKA, type 2 ([place]) 1st admission 11/02/2015  . Hyperlipidemia   . Type II diabetes mellitus ([place]) dx'd 09/2015   mother and grandmother are type 1, dad type 2    Past Surgical History:  Procedure Laterality Date  . NO PAST SURGERIES      Family History  Problem Relation Age of Onset  . Diabetes Mother     Type I  . Hypertension Mother   . Diabetes Father   . Hypertension Father   . Diabetes Cousin     Type I  . Cancer Maternal Aunt     Social  History:  reports that she has never smoked. She has never used smokeless tobacco. She reports that she drinks alcohol. She reports that she does not use drugs.    Review of Systems       Lipids: Has No recent evaluation   Lab Results  Component Value Date   CHOL 131 12/16/2016   HDL 39 (L) 12/16/2016   LDLCALC 65 12/16/2016   TRIG 134 12/16/2016   CHOLHDL 3.4 12/16/2016       No history of hypertension  LABS:  No visits with results within 1 Week(s) from this visit.  Latest known visit with results is:  Admission on 12/17/2016, Discharged on 12/17/2016  Component Date Value Ref Range Status  . Glucose-Capillary 12/17/2016 144* 65 - 99 mg/dL Final  . WBC 12/17/2016 14.1* 4.0 - 10.5 K/uL Final  . RBC 12/17/2016 3.73* 3.87 - 5.11 MIL/uL Final  . Hemoglobin 12/17/2016 9.9* 12.0 - 15.0 g/dL Final  . HCT 12/17/2016 31.3* 36.0 - 46.0 % Final  . MCV 12/17/2016 83.9  78.0 - 100.0 fL Final  . MCH 12/17/2016 26.5  26.0 - 34.0 pg Final  . MCHC 12/17/2016 31.6  30.0 - 36.0 g/dL Final  . RDW 12/17/2016 15.9* 11.5 - 15.5 % Final  . Platelets 12/17/2016 281  150 - 400 K/uL Final  . Neutrophils Relative % 12/17/2016 81  % Final  . Neutro Abs 12/17/2016 11.4* 1.7 - 7.7 K/uL Final  . Lymphocytes Relative 12/17/2016 13  % Final  . Lymphs Abs 12/17/2016 1.9  0.7 - 4.0 K/uL Final  . Monocytes Relative 12/17/2016 6  % Final  . Monocytes Absolute 12/17/2016 0.9  0.1 - 1.0 K/uL Final  . Eosinophils Relative 12/17/2016 0  % Final  . Eosinophils Absolute 12/17/2016 0.1  0.0 - 0.7 K/uL Final  . Basophils Relative 12/17/2016 0  % Final  . Basophils Absolute 12/17/2016 0.0  0.0 - 0.1 K/uL Final  . Sodium 12/17/2016 133* 135 - 145 mmol/L Final  . Potassium 12/17/2016 3.4* 3.5 - 5.1 mmol/L Final  . Chloride 12/17/2016 102  101 - 111 mmol/L Final  . CO2 12/17/2016 20* 22 - 32 mmol/L Final  . Glucose, Bld 12/17/2016 187* 65 - 99 mg/dL Final  . BUN 12/17/2016 5* 6 - 20 mg/dL Final  . Creatinine, Ser  12/17/2016 0.70  0.44 - 1.00 mg/dL Final  . Calcium 12/17/2016 9.5  8.9 -  10.3 mg/dL Final  . Total Protein 12/17/2016 7.3  6.5 - 8.1 g/dL Final  . Albumin 12/17/2016 3.6  3.5 - 5.0 g/dL Final  . AST 12/17/2016 26  15 - 41 U/L Final  . ALT 12/17/2016 26  14 - 54 U/L Final  . Alkaline Phosphatase 12/17/2016 37* 38 - 126 U/L Final  . Total Bilirubin 12/17/2016 0.5  0.3 - 1.2 mg/dL Final  . GFR calc non Af Amer 12/17/2016 >60  >60 mL/min Final  . GFR calc Af Amer 12/17/2016 >60  >60 mL/min Final   Comment: (NOTE) The eGFR has been calculated using the CKD EPI equation. This calculation has not been validated in all clinical situations. eGFR's persistently <60 mL/min signify possible Chronic Kidney Disease.   . Anion gap 12/17/2016 11  5 - 15 Final  . Color, Urine 12/17/2016 YELLOW  YELLOW Final  . APPearance 12/17/2016 HAZY* CLEAR Final  . Specific Gravity, Urine 12/17/2016 1.020  1.005 - 1.030 Final  . pH 12/17/2016 6.0  5.0 - 8.0 Final  . Glucose, UA 12/17/2016 >=500* NEGATIVE mg/dL Final  . Hgb urine dipstick 12/17/2016 NEGATIVE  NEGATIVE Final  . Bilirubin Urine 12/17/2016 NEGATIVE  NEGATIVE Final  . Ketones, ur 12/17/2016 15* NEGATIVE mg/dL Final  . Protein, ur 12/17/2016 NEGATIVE  NEGATIVE mg/dL Final  . Nitrite 12/17/2016 NEGATIVE  NEGATIVE Final  . Leukocytes, UA 12/17/2016 NEGATIVE  NEGATIVE Final  . RBC / HPF 12/17/2016 NONE SEEN  0 - 5 RBC/hpf Final  . WBC, UA 12/17/2016 0-5  0 - 5 WBC/hpf Final  . Bacteria, UA 12/17/2016 RARE* NONE SEEN Final  . Squamous Epithelial / LPF 12/17/2016 0-5* NONE SEEN Final    Physical Examination:  LMP 08/22/2016   She has generalized obesity  ASSESSMENT:  Diabetes previously presenting with ketoacidosis pregnancy  Currently on   treatment, off insulin for about 6 months Most likely she does have only type II diabetes presenting as ketoacidosis initially  A1c is 6.8 which is inadequate for her first trimester of  pregnancy Minimal glucose monitoring being done and not clear how often she is checking her blood sugars, however she thinks her blood sugars are fairly good at home Glucose in the lab is high at 153 nonfasting She still has inadequate knowledge of meal planning Currently not motivated to exercise  Although she previously had reported nausea with metformin and this was regular metformin and has not tried metformin ER   PLAN:   Start home glucose monitoring, given Accu-Chek guide monitor.  She will start monitoring that sugars at least twice a day with some readings fasting and some 2 hours after meals  Needs to bring monitor for download on each visit  Start walking program for exercise.  Consultation with dietitian for meal planning  Trial of metformin ER, starting with 500 mg daily with her main meal at lunch and then if tolerated increasing to twice a day.  If her blood sugars are monitored target or if she is unable to tolerate metformin may consider low-dose glyburide  Follow-up with diabetes educator also for general education  She will need follow-up in 3 weeks and regularly thereafter  There are no Patient Instructions on file for this visit. Counseling time on subjects discussed above is over 50% of today's 25 minute visit    Atharva Mirsky 02/26/2017, 9:15 PM   Note: This note was prepared with Estate agent. Any transcriptional errors that result from this process are unintentional.

## 2017-02-27 ENCOUNTER — Ambulatory Visit: Payer: Medicaid Other | Admitting: Endocrinology

## 2017-03-03 ENCOUNTER — Ambulatory Visit (INDEPENDENT_AMBULATORY_CARE_PROVIDER_SITE_OTHER): Payer: Medicaid Other | Admitting: Endocrinology

## 2017-03-03 ENCOUNTER — Encounter: Payer: Self-pay | Admitting: Endocrinology

## 2017-03-03 VITALS — BP 118/74 | HR 80 | Ht 67.0 in | Wt 225.0 lb

## 2017-03-03 DIAGNOSIS — E1165 Type 2 diabetes mellitus with hyperglycemia: Secondary | ICD-10-CM | POA: Diagnosis not present

## 2017-03-03 MED ORDER — INSULIN DETEMIR 100 UNIT/ML FLEXPEN: PEN_INJECTOR | SUBCUTANEOUS | 1 refills | Status: DC

## 2017-03-03 NOTE — Patient Instructions (Addendum)
Take Novolog 1015  Levemir  insulin: This insulin provides blood sugar control for up to 24 hours.   Start with 10 units at nite daily and increase by 2 units every 3 days until the waking up sugars are under 130. Then continue the same dose.  If blood sugar is under 90 for 2 days in a row, reduce the dose by 2 units.  Note that this insulin does not control the rise of blood sugar with meals

## 2017-03-03 NOTE — Progress Notes (Signed)
Patient ID: Angela Henderson, female   DOB: 07/02/1991, 26 y.o.   MRN: [id number]          Reason for Appointment : Follow-up for diabetes  History of Present Illness          Date of diagnosis:   09/2015         PREVIOUS history She had presented to the emergency room with symptoms of nausea and high blood sugars and was found to have a blood sugar of about 350 She was treated with metformin initially but she does not symptomatically worse and was admitted to the hospital overnight for ketoacidosis in 12/16 Metformin was stopped.  She also was reporting nausea with this Although she appeared to require large doses of insulin on her initial consultation in 1/17 she did not follow-up subsequently   Recent history:    Her recent A1c is 7%, previously 6.8  CURRENT treatment: NovoLog before meals 25 units 2-3 times a day  Glucose patterns, problems identified and current management:    She was recommended follow-up after her initial pregnancy visit in December but she did not come back  She was told to try metformin but she thinks she cannot tolerate even a small dose and stopped taking this  She only started monitoring her blood sugar 3 weeks ago and did not bring any meter for download today  Apparently her blood sugars are running frequently over 200 and was started by her obstetrician on Novolog 12 units before meals and this was further increased and she is not taking 25 minutes  However she does not take this before eating and may take it right when she is starting to eat or afterwards  She is not taking any long-acting insulin  Generally checking blood sugars after meals but not consistently 3 times a day  She has variable readings after meals per day seemed to be mostly higher after breakfast and lunch; however she does not always eat breakfast in the morning  FASTING readings are averaging roughly about 140-150  She is not following any regular diet or carbohydrate  counting, she does try to eat her evening meal at home before going to work  Glucose monitoring:  is being done about 3 times a day, review of home diary as follows:  Mean values apply above for all meters except median for One Touch  PRE-MEAL Fasting Lunch Dinner Bedtime Overall  Glucose range: 108-185      Mean/median:        POST-MEAL PC Breakfast PC Lunch PC Dinner  Glucose range: 77-236  127-200  Mean/median:       Self-care: The diet that the patient has been following is: None.    Mealtimes are: breakfastIf she has any is Egg, bagel, Lunch: 2 PM  Dinner: 6-7 pm         Exercise:  none recently        Dietician consultation: Most recent: 12/16 in the hospital         CDE consultation: None  Wt Readings from Last 3 Encounters:  03/03/17 225 lb (102.1 kg)  12/16/16 216 lb 3.2 oz (98.1 kg)  11/03/16 205 lb (93 kg)    Diabetes labs:  Lab Results  Component Value Date   HGBA1C 7 02/20/2017   HGBA1C 6.8 (H) 10/31/2016   HGBA1C 12.5 (H) 11/02/2015   Lab Results  Component Value Date   MICROALBUR 2.3 (H) 10/31/2016   LDLCALC 65 12/16/2016   CREATININE 0.70  12/17/2016    Allergies as of 03/03/2017   No Known Allergies     Medication List       Accurate as of 03/03/17 11:21 AM. Always use your most recent med list.          accu-chek softclix lancets Use as instructed   acetaminophen 500 MG tablet Commonly known as:  TYLENOL Take 1 tablet (500 mg total) by mouth every 6 (six) hours as needed for mild pain, moderate pain or headache.   BAYER CONTOUR LINK MONITOR w/Device Kit Test blood sugar tid and QHS   Calcium 600-400 MG-UNIT Chew Chew 1 tablet by mouth daily.   ferrous sulfate 325 (65 FE) MG EC tablet Take 1 tablet (325 mg total) by mouth 3 (three) times daily with meals.   glucose blood test strip Commonly known as:  ACCU-CHEK GUIDE Use to test blood sugar 3 times daily   glucose blood test strip Use as instructed   insulin aspart 100  UNIT/ML FlexPen Commonly known as:  NOVOLOG Inject 25 Units into the skin.   insulin starter kit- syringes Misc 1 kit by Other route once.   Insulin Syringe-Needle U-100 31G X 1/4" 0.3 ML Misc 1 each by Does not apply route at bedtime. Use to inject insulin daily. Dx code E11.9   loratadine 10 MG tablet Commonly known as:  CLARITIN Take 1 tablet (10 mg total) by mouth daily.   metFORMIN 500 MG 24 hr tablet Commonly known as:  GLUCOPHAGE-XR Take 2 tablets daily       Allergies: No Known Allergies  Past Medical History:  Diagnosis Date  . DKA, type 2 ([place]) 1st admission 11/02/2015  . Hyperlipidemia   . Type II diabetes mellitus ([place]) dx'd 09/2015   mother and grandmother are type 1, dad type 2    Past Surgical History:  Procedure Laterality Date  . NO PAST SURGERIES      Family History  Problem Relation Age of Onset  . Diabetes Mother     Type I  . Hypertension Mother   . Diabetes Father   . Hypertension Father   . Diabetes Cousin     Type I  . Cancer Maternal Aunt     Social History:  reports that she has never smoked. She has never used smokeless tobacco. She reports that she drinks alcohol. She reports that she does not use drugs.    Review of Systems       Lipids: Has No recent evaluation   Lab Results  Component Value Date   CHOL 131 12/16/2016   HDL 39 (L) 12/16/2016   LDLCALC 65 12/16/2016   TRIG 134 12/16/2016   CHOLHDL 3.4 12/16/2016       No history of hypertension  LABS:  Office Visit on 03/03/2017  Component Date Value Ref Range Status  . Hemoglobin A1C 02/20/2017 7   Final    Physical Examination:  BP 118/74   Pulse 80   Ht 5' 7"  (1.702 m)   Wt 225 lb (102.1 kg)   LMP 08/22/2016   BMI 35.24 kg/m   She has generalized obesity  Her due date is in middle July  ASSESSMENT:  Diabetes previously presenting with ketoacidosis with pregnancy  She is coming back for follow-up after several months with inadequate blood sugar  control and she is about 6 months pregnant now Overall has been relatively noncompliant with taking care of her diabetes and follow-up  She had intolerance to metformin and is  now taking relatively large doses of mealtime insulin with variable control Her A1c is 7% but her blood sugars appear to be higher than expected for this A1c  FASTING blood sugars are consistently high with only rare readings near 100  She does not have a meal plan and not aware of carbohydrate counting Currently not motivated to exercise    PLAN:   Discussed blood sugar targets both fasting and 2 hours after meals  She needs to start basal insulin with Levemir in the evening  She will start with 10 units before she goes to work in the evening and titrated this every 3 days until her morning sugars are below 90, flowsheet given and discussed in detail how to adjust the dose  She will be referred urgently for meal planning consultation in carbohydrate counting which will help adjust her mealtime doses based on what she is eating.  Most likely will need to cover her carbohydrates with a 1:3 ratio  She will need to have balanced meals with some protein at each meal  Encouraged her to walk for exercise  Follow-up in 2-3 weeks for further adjustment  Patient Instructions  Take Novolog 1015  Levemir  insulin: This insulin provides blood sugar control for up to 24 hours.   Start with 10 units at nite daily and increase by 2 units every 3 days until the waking up sugars are under 130. Then continue the same dose.  If blood sugar is under 90 for 2 days in a row, reduce the dose by 2 units.  Note that this insulin does not control the rise of blood sugar with meals     Counseling time on subjects discussed above is over 50% of today's 25 minute visit    Taeko Schaffer 03/03/2017, 11:21 AM   Note: This note was prepared with Estate agent. Any transcriptional errors that result from  this process are unintentional.

## 2017-03-04 ENCOUNTER — Telehealth: Payer: Self-pay | Admitting: Endocrinology

## 2017-03-04 ENCOUNTER — Other Ambulatory Visit: Payer: Self-pay

## 2017-03-04 MED ORDER — INSULIN DETEMIR 100 UNIT/ML ~~LOC~~ SOLN: SUBCUTANEOUS | 3 refills | Status: DC

## 2017-03-04 NOTE — Telephone Encounter (Signed)
Patient stated pharmacy haven't received the prescription for the Levemir.   [facility] [phone] Judithann Sheen, [place] - [phone] (Phone) [phone] (Fax)

## 2017-03-04 NOTE — Telephone Encounter (Signed)
This was ordered on 03/03/17 but insurance will not pay for the flexpen- I sent new prescription today

## 2017-03-24 ENCOUNTER — Ambulatory Visit: Payer: Medicaid Other | Admitting: Endocrinology

## 2017-03-24 NOTE — Progress Notes (Deleted)
Patient ID: Angela Henderson, female   DOB: 07/02/1991, 26 y.o.   MRN: [id number]          Reason for Appointment : Follow-up for diabetes  History of Present Illness          Date of diagnosis:   09/2015         PREVIOUS history She had presented to the emergency room with symptoms of nausea and high blood sugars and was found to have a blood sugar of about 350 She was treated with metformin initially but she does not symptomatically worse and was admitted to the hospital overnight for ketoacidosis in 12/16 Metformin was stopped.  She also was reporting nausea with this Although she appeared to require large doses of insulin on her initial consultation in 1/17 she did not follow-up subsequently   Recent history:    Her recent A1c is 7%, previously 6.8  CURRENT treatment: NovoLog before meals 25 units 2-3 times a day  Glucose patterns, problems identified and current management:    She was recommended follow-up after her initial pregnancy visit in December but she did not come back  She was told to try metformin but she thinks she cannot tolerate even a small dose and stopped taking this  She only started monitoring her blood sugar 3 weeks ago and did not bring any meter for download today  Apparently her blood sugars are running frequently over 200 and was started by her obstetrician on Novolog 12 units before meals and this was further increased and she is not taking 25 minutes  However she does not take this before eating and may take it right when she is starting to eat or afterwards  She is not taking any long-acting insulin  Generally checking blood sugars after meals but not consistently 3 times a day  She has variable readings after meals per day seemed to be mostly higher after breakfast and lunch; however she does not always eat breakfast in the morning  FASTING readings are averaging roughly about 140-150  She is not following any regular diet or carbohydrate  counting, she does try to eat her evening meal at home before going to work  Glucose monitoring:  is being done about 3 times a day, review of home diary as follows:  Mean values apply above for all meters except median for One Touch  PRE-MEAL Fasting Lunch Dinner Bedtime Overall  Glucose range: 108-185      Mean/median:        POST-MEAL PC Breakfast PC Lunch PC Dinner  Glucose range: 77-236  127-200  Mean/median:       Self-care: The diet that the patient has been following is: None.    Mealtimes are: breakfastIf she has any is Egg, bagel, Lunch: 2 PM  Dinner: 6-7 pm         Exercise:  none recently        Dietician consultation: Most recent: 12/16 in the hospital         CDE consultation: None  Wt Readings from Last 3 Encounters:  03/03/17 225 lb (102.1 kg)  12/16/16 216 lb 3.2 oz (98.1 kg)  11/03/16 205 lb (93 kg)    Diabetes labs:  Lab Results  Component Value Date   HGBA1C 7 02/20/2017   HGBA1C 6.8 (H) 10/31/2016   HGBA1C 12.5 (H) 11/02/2015   Lab Results  Component Value Date   MICROALBUR 2.3 (H) 10/31/2016   LDLCALC 65 12/16/2016   CREATININE 0.70  12/17/2016    Allergies as of 03/24/2017   No Known Allergies     Medication List       Accurate as of 03/24/17 12:47 PM. Always use your most recent med list.          accu-chek softclix lancets Use as instructed   acetaminophen 500 MG tablet Commonly known as:  TYLENOL Take 1 tablet (500 mg total) by mouth every 6 (six) hours as needed for mild pain, moderate pain or headache.   BAYER CONTOUR LINK MONITOR w/Device Kit Test blood sugar tid and QHS   Calcium 600-400 MG-UNIT Chew Chew 1 tablet by mouth daily.   ferrous sulfate 325 (65 FE) MG EC tablet Take 1 tablet (325 mg total) by mouth 3 (three) times daily with meals.   glucose blood test strip Commonly known as:  ACCU-CHEK GUIDE Use to test blood sugar 3 times daily   glucose blood test strip Use as instructed   insulin aspart 100  UNIT/ML FlexPen Commonly known as:  NOVOLOG Inject 25 Units into the skin.   Insulin Detemir 100 UNIT/ML Pen Commonly known as:  LEVEMIR FLEXPEN Start with 10 units and increase every 3 days as directed   insulin detemir 100 UNIT/ML injection Commonly known as:  LEVEMIR Start with 10 units and increase every 3 days as directed   insulin starter kit- syringes Misc 1 kit by Other route once.   Insulin Syringe-Needle U-100 31G X 1/4" 0.3 ML Misc 1 each by Does not apply route at bedtime. Use to inject insulin daily. Dx code E11.9   loratadine 10 MG tablet Commonly known as:  CLARITIN Take 1 tablet (10 mg total) by mouth daily.   metFORMIN 500 MG 24 hr tablet Commonly known as:  GLUCOPHAGE-XR Take 2 tablets daily       Allergies: No Known Allergies  Past Medical History:  Diagnosis Date  . DKA, type 2 ([place]) 1st admission 11/02/2015  . Hyperlipidemia   . Type II diabetes mellitus ([place]) dx'd 09/2015   mother and grandmother are type 1, dad type 2    Past Surgical History:  Procedure Laterality Date  . NO PAST SURGERIES      Family History  Problem Relation Age of Onset  . Diabetes Mother     Type I  . Hypertension Mother   . Diabetes Father   . Hypertension Father   . Diabetes Cousin     Type I  . Cancer Maternal Aunt     Social History:  reports that she has never smoked. She has never used smokeless tobacco. She reports that she drinks alcohol. She reports that she does not use drugs.    Review of Systems       Lipids: Has No recent evaluation   Lab Results  Component Value Date   CHOL 131 12/16/2016   HDL 39 (L) 12/16/2016   LDLCALC 65 12/16/2016   TRIG 134 12/16/2016   CHOLHDL 3.4 12/16/2016       No history of hypertension  LABS:  No visits with results within 1 Week(s) from this visit.  Latest known visit with results is:  Office Visit on 03/03/2017  Component Date Value Ref Range Status  . Hemoglobin A1C 02/20/2017 7   Final     Physical Examination:  LMP 08/22/2016   She has generalized obesity  Her due date is in middle July  ASSESSMENT:  Diabetes previously presenting with ketoacidosis with pregnancy  She is coming back for  follow-up after several months with inadequate blood sugar control and she is about 6 months pregnant now Overall has been relatively noncompliant with taking care of her diabetes and follow-up  She had intolerance to metformin and is now taking relatively large doses of mealtime insulin with variable control Her A1c is 7% but her blood sugars appear to be higher than expected for this A1c  FASTING blood sugars are consistently high with only rare readings near 100  She does not have a meal plan and not aware of carbohydrate counting Currently not motivated to exercise    PLAN:   Discussed blood sugar targets both fasting and 2 hours after meals  She needs to start basal insulin with Levemir in the evening  She will start with 10 units before she goes to work in the evening and titrated this every 3 days until her morning sugars are below 90, flowsheet given and discussed in detail how to adjust the dose  She will be referred urgently for meal planning consultation in carbohydrate counting which will help adjust her mealtime doses based on what she is eating.  Most likely will need to cover her carbohydrates with a 1:3 ratio  She will need to have balanced meals with some protein at each meal  Encouraged her to walk for exercise  Follow-up in 2-3 weeks for further adjustment  There are no Patient Instructions on file for this visit. Counseling time on subjects discussed above is over 50% of today's 25 minute visit    Angela Henderson 03/24/2017, 12:47 PM   Note: This note was prepared with Dragon voice recognition system technology. Any transcriptional errors that result from this process are unintentional.

## 2017-03-30 ENCOUNTER — Ambulatory Visit: Payer: Medicaid Other | Admitting: Dietician

## 2017-03-30 ENCOUNTER — Telehealth: Payer: Self-pay | Admitting: Endocrinology

## 2017-03-30 NOTE — Telephone Encounter (Signed)
Schedule patient for follow-up at the earliest appointment  

## 2017-03-30 NOTE — Telephone Encounter (Signed)
Patient no showed today's appt. Please advise on how to follow up. °A. No follow up necessary. °B. Follow up urgent. Contact patient immediately. °C. Follow up necessary. Contact patient and schedule visit in ___ days. °D. Follow up advised. Contact patient and schedule visit in ____weeks. ° °

## 2017-04-17 ENCOUNTER — Inpatient Hospital Stay (HOSPITAL_COMMUNITY)
Admission: AD | Admit: 2017-04-17 | Discharge: 2017-04-17 | Disposition: A | Discharge status: Home / Self Care | Payer: Medicaid Other | Source: Ambulatory Visit | Attending: Obstetrics and Gynecology | Admitting: Obstetrics and Gynecology

## 2017-04-17 ENCOUNTER — Telehealth: Payer: Self-pay | Admitting: Endocrinology

## 2017-04-17 ENCOUNTER — Inpatient Hospital Stay (HOSPITAL_COMMUNITY): Payer: Medicaid Other

## 2017-04-17 ENCOUNTER — Encounter (HOSPITAL_COMMUNITY): Payer: Self-pay | Admitting: *Deleted

## 2017-04-17 DIAGNOSIS — O368331 Maternal care for abnormalities of the fetal heart rate or rhythm, third trimester, fetus 1: Secondary | ICD-10-CM | POA: Diagnosis not present

## 2017-04-17 DIAGNOSIS — O24414 Gestational diabetes mellitus in pregnancy, insulin controlled: Secondary | ICD-10-CM | POA: Insufficient documentation

## 2017-04-17 DIAGNOSIS — Z3A34 34 weeks gestation of pregnancy: Secondary | ICD-10-CM | POA: Diagnosis not present

## 2017-04-17 DIAGNOSIS — O368390 Maternal care for abnormalities of the fetal heart rate or rhythm, unspecified trimester, not applicable or unspecified: Secondary | ICD-10-CM

## 2017-04-17 LAB — URINALYSIS, ROUTINE W REFLEX MICROSCOPIC
BILIRUBIN URINE: NEGATIVE
GLUCOSE, UA: 50 mg/dL — AB
HGB URINE DIPSTICK: NEGATIVE
KETONES UR: 20 mg/dL — AB
NITRITE: NEGATIVE
PH: 5 (ref 5.0–8.0)
Protein, ur: 30 mg/dL — AB
SPECIFIC GRAVITY, URINE: 1.013 (ref 1.005–1.030)

## 2017-04-17 NOTE — Discharge Instructions (Signed)
Biophysical Profile °A biophysical profile is a non-invasive test that may be done during pregnancy to check that your developing baby (fetus) and your placenta are healthy. Your health care provider may recommend a biophysical profile if your pregnancy is at a higher risk for certain problems. A biophysical profile is usually done during the last 3 months of pregnancy (third trimester). °A biophysical profile combines two tests to check the health of your baby. In one test, you will have a device strapped to your belly to measure your baby’s heart rate. The other test involves using sound waves and a computer (ultrasound) to create an image of your baby inside your womb (uterus). Together, these tests tell your health care provider about the overall health of your baby. °Tell a health care provider about: °· Any allergies you have. °· All medicines you are taking, including vitamins, herbs, eye drops, creams, and over-the-counter medicines. °· Any medical conditions you have. °· Any concerns you have about your pregnancy. °· Any symptoms such as abdominal pain or contractions, nausea or vomiting, vaginal bleeding, leaking of amniotic fluid, decreased fetal movements, fever or infection, increased swelling, headaches, or visual disturbances. °· How often you feel your baby move. °What are the risks? °There are no risks to you or your baby from a biophysical profile. °What happens before the procedure? °Ask your health care provider how to prepare. °· You may need to drink fluids so that you have a full bladder for your ultrasound. °· You may also need to eat before you arrive for the test. That makes your baby more active. ° °What happens during the procedure? °· You will lie on your back on an exam table. °· Your blood pressure may be monitored during the procedure. °· A belt will be placed around your belly. The belt has a sensor to measure your baby’s heart rate. °· You may have to wear another belt and sensor to  measure any muscle movements (contractions) in your uterus. During the ultrasound, a health care provider or technician will gently roll a handheld device (transducer) over your belly. This device sends signals to a computer that creates images of your baby. °· Five areas of your baby’s health and development will be checked during the biophysical profile: °? Heart rate. °? Breathing. °? Movement. °? Active muscle movement (muscle tone). °? The amount of fluid in your uterus (amniotic fluid). °The procedure may vary among health care providers and hospitals. °What happens after the procedure? °· Your health care provider will discuss your results with you. The results of a biophysical profile are scored in a range of 0 to 10. Each area that is evaluated is given a score of 0 or 2 points. If you get a score of 6 or less, you may need further testing, or your baby may need to be delivered early. A score of 8 to 10 with normal amniotic fluid levels is considered normal. °· Unless you need additional testing, you can go home right after the procedure and resume your normal activities. °Summary °· A biophysical profile is a non-invasive test that may be done during pregnancy to check that your developing baby (fetus) and your placenta are healthy. °· A biophysical profile combines two tests: A test to measure your baby's heart rate and an ultrasound test to create an image of your baby in the womb. °· Tell your health care provider about any concerns you have about your pregnancy or any pregnancy-related symptoms. °· If you   get a score of 6 or less, you may need further testing, or your baby may need to be delivered early. A score of 8 to 10 with normal amniotic fluid levels is considered normal. This information is not intended to replace advice given to you by your health care provider. Make sure you discuss any questions you have with your health care provider. Document Released: 10/24/2002 Document Revised:  12/05/2016 Document Reviewed: 12/05/2016 Elsevier Interactive Patient Education  2018 [employer] Fetal Monitoring Overview Monitoring your developing baby (fetus) before birth can identify potential problems for you and your baby. Fetal monitoring can:  Help prevent serious problems from developing.  Guide health care providers in how best to care for your unborn baby.  Check your unborn babys overall health.  The amount of monitoring and the specific tests that will be done will vary. It will depend on whether your pregnancy is considered to be low or high risk. For example, you may need certain tests if you have a medical problem that can put your unborn baby at risk. Health care providers use several techniques and tests to monitor your baby before birth. No single test is perfectly accurate, and you may need to follow up with your health care provider if there are any concerns. Fetal movement counts When you are past 20 weeks of pregnancy, you may feel your baby move. As your baby gets bigger, these movements are easier to feel. A fetal movement count is when you count the number of movements (kicks, flutters, swishes, rolls, or jabs) over a specific period of time. You record the number and report it to your health care provider. This is often recommended in high-risk pregnancies, but it is good for every pregnant woman to do. Your health care provider may ask you to start counting fetal movements at 28 weeks of pregnancy. Non-stress test The non-stress test:  Evaluates fetal heartbeat: ? While your baby is at rest. ? While your baby is moving.  Is often done as part of a set of tests called the biophysical profile.  May show signs that it is time for your baby to be delivered.  The heart rate in a healthy baby will speed up when the baby moves or kicks. The heart rate will decrease at rest, and the peaks or accelerations of the heart rate will be lower. If the test brings up any  questions or concerns, you may need to have more testing. This test may be done if your pregnancy goes past your due date. It is also commonly done in high-risk pregnancies beginning at 32 weeks. Biophysical profile The biophysical profile is a set of tests that are done to find out how well the baby is doing. It includes the non-stress test along with imaging tests that use sound waves (ultrasound) to create images of your baby. In a biophysical profile, the following tests are done and scored:  Fetal heartbeat.  Fetal breathing.  Fetal movements.  Fetal muscle tone.  Amount of amniotic fluid.  Each test gets a score of either 2 (normal) or 0 (abnormal). The scores are then added together for a total score. A low score may mean that you and your baby need additional monitoring or special care. Sometimes your health care provider may recommend that you deliver early. This test is usually done after 32 weeks of pregnancy. It can be done sooner, if needed. Contraction stress test A contraction stress test monitors the heart rate of your baby during  contractions. The test checks to see how your baby will tolerate the stress of labor. Health care providers use this test to further evaluate your baby when other tests, such as the biophysical profile, have shown that there may be a problem. This test may be done:  Any time you are in labor.  Between weeks 32 and 34 of your pregnancy.  Later, if necessary.  Modified biophysical profile A modified biophysical profile is a two-part test. You will have:  An ultrasound exam, to check how much fluid is surrounding your baby inside of your womb (amniotic fluid).  A non-stress test, to check your babys heart rate.  The results will determine whether a full biophysical profile may be needed. This test is usually done late in your final 3 months (third trimester) of pregnancy. Umbilical artery Doppler velocimetry Umbilical artery Doppler  velocimetry uses sound waves to measure the flow of blood between you and your baby. This test:  Measures the amount of blood flow through the cord that attaches your baby to your womb (umbilical cord).  Measures the speed of the blood flow.  You likely only need this test to check your baby's condition if you have a high-risk pregnancy. All types of monitoring aim to protect your health and that of your baby. The best way to have a healthy pregnancy and a healthy baby is to learn as much as you can about your pregnancy and to work closely with all your health care providers. This information is not intended to replace advice given to you by your health care provider. Make sure you discuss any questions you have with your health care provider. Document Released: 10/24/2002 Document Revised: 07/01/2016 Document Reviewed: 06/02/2016 Elsevier Interactive Patient Education  [employer].

## 2017-04-17 NOTE — MAU Provider Note (Signed)
Chief Complaint:  decel in fetal heart rate   First Provider Initiated Contact with Patient 04/17/17 1227     HPI: Angela Henderson is a 26 y.o. G1P0 at 37w0dho presents to maternity admissions reporting single fetal heart rate deceleration in office.  She reports good fetal movement, denies LOF, vaginal bleeding, vaginal itching/burning, urinary symptoms, h/a, dizziness, n/v, diarrhea, constipation or fever/chills.    Other  This is a new problem. The current episode started today. The problem occurs rarely. The problem has been resolved. Pertinent negatives include no abdominal pain, chills, congestion, fever, myalgias, nausea, visual change or vomiting. Nothing aggravates the symptoms. She has tried nothing for the symptoms.    RN Note: Pt is IDDM, was on the monitor in the office and had a decel in the fetal tracing.  Sent over for prolonged monitoring.  Denies any pain, bleeding or leaking.  Past Medical History: Past Medical History:  Diagnosis Date  . [place], [place]  . Hyperlipidemia   . [place] [place]   mother and grandmother are type 155 dad type 2    Past obstetric history: OB History  Gravida Para Term Preterm AB Living  1            SAB TAB Ectopic Multiple Live Births               # Outcome Date GA Lbr Len/2nd Weight Sex Delivery Anes PTL Lv  1 Current               Past Surgical History: Past Surgical History:  Procedure Laterality Date  . WISDOM TOOTH EXTRACTION      Family History: Family History  Problem Relation Age of Onset  . Diabetes Mother        Type I  . Hypertension Mother   . Diabetes Father   . Hypertension Father   . Diabetes Cousin        Type I  . Cancer Maternal Aunt     Social History: Social History  Substance Use Topics  . Smoking status: Never Smoker  . Smokeless tobacco: Never Used  . Alcohol use No     Comment: 11/02/2015 "1-2 drinks on weekend maybe once/month"     Allergies: No Known Allergies  Meds:  Prescriptions Prior to Admission  Medication Sig Dispense Refill Last Dose  . diphenhydrAMINE (BENADRYL) 25 MG tablet Take 25 mg by mouth at bedtime as needed for allergies.   04/16/2017 at Unknown time  . ferrous sulfate 325 (65 FE) MG EC tablet Take 1 tablet (325 mg total) by mouth 3 (three) times daily with meals. 90 tablet 3 04/16/2017 at Unknown time  . insulin aspart (NOVOLOG) 100 UNIT/ML FlexPen Inject 25 Units into the skin 3 (three) times daily with meals.    04/16/2017 at Unknown time  . LANTUS 100 UNIT/ML injection Inject 20 Units into the skin at bedtime.  0 04/16/2017 at Unknown time  . loratadine (CLARITIN) 10 MG tablet Take 1 tablet (10 mg total) by mouth daily. (Patient taking differently: Take 10 mg by mouth daily as needed for allergies. ) 30 tablet 2 Past Month at Unknown time  . Prenatal Vit-Fe Fumarate-FA (PRENATAL MULTIVITAMIN) TABS tablet Take 1 tablet by mouth daily at 12 noon.   04/16/2017 at Unknown time  . acetaminophen (TYLENOL) 500 MG tablet Take 1 tablet (500 mg total) by mouth every 6 (six) hours as needed for mild pain, moderate  pain or headache. (Patient not taking: Reported on 04/17/2017) 60 tablet 0 prn at Unknown time  . Blood Glucose Monitoring Suppl (BAYER CONTOUR LINK MONITOR) w/Device KIT Test blood sugar tid and QHS 1 kit 0 Taking  . Calcium 600-400 MG-UNIT CHEW Chew 1 tablet by mouth daily. (Patient not taking: Reported on 04/17/2017) 30 tablet 2 Not Taking at Unknown time  . glucose blood (ACCU-CHEK GUIDE) test strip Use to test blood sugar 3 times daily (Patient not taking: Reported on 03/03/2017) 100 each 5 Not Taking  . glucose blood test strip Use as instructed 100 each 12 Taking  . Insulin Detemir (LEVEMIR FLEXPEN) 100 UNIT/ML Pen Start with 10 units and increase every 3 days as directed (Patient not taking: Reported on 04/17/2017) 15 mL 1 Not Taking at Unknown time  . insulin detemir (LEVEMIR) 100 UNIT/ML injection  Start with 10 units and increase every 3 days as directed (Patient not taking: Reported on 04/17/2017) 10 mL 3 Not Taking at Unknown time  . Lancet Devices (ACCU-CHEK SOFTCLIX) lancets Use as instructed 1 each 0 Taking  . metFORMIN (GLUCOPHAGE-XR) 500 MG 24 hr tablet Take 2 tablets daily (Patient not taking: Reported on 03/03/2017) 60 tablet 1 Not Taking at Unknown time    I have reviewed patient's Past Medical Hx, Surgical Hx, Family Hx, Social Hx, medications and allergies.   ROS:  Review of Systems  Constitutional: Negative for chills and fever.  HENT: Negative for congestion.   Gastrointestinal: Negative for abdominal pain, nausea and vomiting.  Musculoskeletal: Negative for myalgias.   Other systems negative  Physical Exam  Patient Vitals for the past 24 hrs:  BP Temp Temp src Pulse Resp SpO2 Weight  04/17/17 1151 134/79 98.8 F (37.1 C) Oral (!) 108 20 100 % -  04/17/17 1143 - - - - - - 218 lb 4 oz (99 kg)   Constitutional: Well-developed, well-nourished female in no acute distress.  Cardiovascular: normal rate and rhythm Respiratory: normal effort, clear to auscultation bilaterally GI: Abd soft, non-tender, gravid appropriate for gestational age.   No rebound or guarding. MS: Extremities nontender, no edema, normal ROM Neurologic: Alert and oriented x 4.  GU: Neg CVAT.  PELVIC EXAM: deferred  FHT:  Baseline 140 , moderate variability, accelerations present, no decelerations Contractions:  Irregular  Rare   Labs: Results for orders placed or performed during the hospital encounter of 04/17/17 (from the past 24 hour(s))  Urinalysis, Routine w reflex microscopic     Status: Abnormal   Collection Time: 04/17/17 11:44 AM  Result Value Ref Range   Color, Urine YELLOW YELLOW   APPearance HAZY (A) CLEAR   Specific Gravity, Urine 1.013 1.005 - 1.030   pH 5.0 5.0 - 8.0   Glucose, UA 50 (A) NEGATIVE mg/dL   Hgb urine dipstick NEGATIVE NEGATIVE   Bilirubin Urine NEGATIVE  NEGATIVE   Ketones, ur 20 (A) NEGATIVE mg/dL   Protein, ur 30 (A) NEGATIVE mg/dL   Nitrite NEGATIVE NEGATIVE   Leukocytes, UA MODERATE (A) NEGATIVE   RBC / HPF 6-30 0 - 5 RBC/hpf   WBC, UA 6-30 0 - 5 WBC/hpf   Bacteria, UA MANY (A) NONE SEEN   Squamous Epithelial / LPF 0-5 (A) NONE SEEN   Mucous PRESENT       Imaging:  No results found.  MAU Course/MDM: I have ordered labs and reviewed results.  NST reviewed and found to be reactive with no decelerations Consult Dr Ouida Sills with presentation, exam findings and  test results.  Treatments in MAU included EFM.    Assessment: 1. Fetal heart rate decelerations affecting management of mother   2. [redacted] weeks gestation of pregnancy   3. Insulin controlled gestational diabetes mellitus (GDM) in third trimester   4.      Reassuring fetal heart rate tracing, Category I  Plan: Discharge home Fetal movement monitoring Preterm Labor precautions and fetal kick counts Follow up in Office Monday for prenatal visits and recheck of FHR  Encouraged to return here or to other Urgent Care/ED if she develops worsening of symptoms, increase in pain, fever, or other concerning symptoms.   Pt stable at time of discharge.  Hansel Feinstein CNM, MSN Certified Nurse-Midwife 04/17/2017 2:29 PM

## 2017-04-17 NOTE — Telephone Encounter (Signed)
LM for pt to call back to schedule appt.

## 2017-04-17 NOTE — MAU Note (Signed)
Pt is IDDM, was on the monitor in the office and had a decel in the fetal tracing.  Sent over for prolonged monitoring.  Denies any pain, bleeding or leaking.

## 2017-04-30 ENCOUNTER — Other Ambulatory Visit: Payer: Self-pay | Admitting: Obstetrics and Gynecology

## 2017-04-30 LAB — OB RESULTS CONSOLE GBS: GBS: POSITIVE

## 2017-05-15 ENCOUNTER — Inpatient Hospital Stay (HOSPITAL_COMMUNITY): Payer: Medicaid Other | Admitting: Anesthesiology

## 2017-05-15 ENCOUNTER — Encounter (HOSPITAL_COMMUNITY): Payer: Self-pay

## 2017-05-15 ENCOUNTER — Inpatient Hospital Stay (HOSPITAL_COMMUNITY)
Admission: AD | Admit: 2017-05-15 | Discharge: 2017-05-18 | DRG: 774 | Disposition: A | Discharge status: Home / Self Care | Payer: Medicaid Other | Source: Ambulatory Visit | Attending: Obstetrics & Gynecology | Admitting: Obstetrics & Gynecology

## 2017-05-15 DIAGNOSIS — Z3A38 38 weeks gestation of pregnancy: Secondary | ICD-10-CM

## 2017-05-15 DIAGNOSIS — O99824 Streptococcus B carrier state complicating childbirth: Secondary | ICD-10-CM | POA: Diagnosis present

## 2017-05-15 DIAGNOSIS — E119 Type 2 diabetes mellitus without complications: Secondary | ICD-10-CM | POA: Diagnosis present

## 2017-05-15 DIAGNOSIS — O2412 Pre-existing diabetes mellitus, type 2, in childbirth: Secondary | ICD-10-CM | POA: Diagnosis present

## 2017-05-15 DIAGNOSIS — O479 False labor, unspecified: Secondary | ICD-10-CM

## 2017-05-15 DIAGNOSIS — Z794 Long term (current) use of insulin: Secondary | ICD-10-CM

## 2017-05-15 LAB — CBC
HEMATOCRIT: 31.8 % — AB (ref 36.0–46.0)
HEMOGLOBIN: 9.9 g/dL — AB (ref 12.0–15.0)
MCH: 24.8 pg — AB (ref 26.0–34.0)
MCHC: 31.1 g/dL (ref 30.0–36.0)
MCV: 79.5 fL (ref 78.0–100.0)
Platelets: 245 10*3/uL (ref 150–400)
RBC: 4 MIL/uL (ref 3.87–5.11)
RDW: 16.9 % — ABNORMAL HIGH (ref 11.5–15.5)
WBC: 12.2 10*3/uL — ABNORMAL HIGH (ref 4.0–10.5)

## 2017-05-15 LAB — TYPE AND SCREEN
ABO/RH(D): O POS
Antibody Screen: NEGATIVE

## 2017-05-15 LAB — GLUCOSE, CAPILLARY
Glucose-Capillary: 120 mg/dL — ABNORMAL HIGH (ref 65–99)
Glucose-Capillary: 119 mg/dL — ABNORMAL HIGH (ref 65–99)

## 2017-05-15 MED ORDER — PHENYLEPHRINE 40 MCG/ML (10ML) SYRINGE FOR IV PUSH (FOR BLOOD PRESSURE SUPPORT)
PREFILLED_SYRINGE | INTRAVENOUS | Status: AC
  Filled 2017-05-15: qty 10

## 2017-05-15 MED ORDER — ONDANSETRON HCL 4 MG/2ML IJ SOLN: 4.0000 mg | Freq: Four times a day (QID) | INTRAMUSCULAR | Status: DC | PRN

## 2017-05-15 MED ORDER — LACTATED RINGERS IV SOLN: 500.0000 mL | INTRAVENOUS | Status: DC | PRN

## 2017-05-15 MED ORDER — OXYTOCIN BOLUS FROM INFUSION
500.0000 mL | Freq: Once | INTRAVENOUS | Status: AC
  Administered 2017-05-16: 500 mL via INTRAVENOUS

## 2017-05-15 MED ORDER — FENTANYL 2.5 MCG/ML BUPIVACAINE 1/10 % EPIDURAL INFUSION (WH - ANES)
INTRAMUSCULAR | Status: AC
  Filled 2017-05-15: qty 100

## 2017-05-15 MED ORDER — AMPICILLIN SODIUM 2 G IJ SOLR
2.0000 g | Freq: Once | INTRAMUSCULAR | Status: AC
  Administered 2017-05-15: 2 g via INTRAVENOUS
  Filled 2017-05-15: qty 2000

## 2017-05-15 MED ORDER — OXYCODONE-ACETAMINOPHEN 5-325 MG PO TABS: 2.0000 | ORAL_TABLET | ORAL | Status: DC | PRN

## 2017-05-15 MED ORDER — FLEET ENEMA 7-19 GM/118ML RE ENEM: 1.0000 | ENEMA | RECTAL | Status: DC | PRN

## 2017-05-15 MED ORDER — SOD CITRATE-CITRIC ACID 500-334 MG/5ML PO SOLN: 30.0000 mL | ORAL | Status: DC | PRN

## 2017-05-15 MED ORDER — SODIUM CHLORIDE 0.9 % IV SOLN
INTRAVENOUS | Status: DC
  Administered 2017-05-16: 0.9 [IU]/h via INTRAVENOUS
  Administered 2017-05-16: 1 [IU]/h via INTRAVENOUS
  Administered 2017-05-16: 0.8 [IU]/h via INTRAVENOUS
  Administered 2017-05-16: 1.1 [IU]/h via INTRAVENOUS
  Administered 2017-05-16: 0.9 [IU]/h via INTRAVENOUS
  Filled 2017-05-15: qty 1

## 2017-05-15 MED ORDER — OXYTOCIN 40 UNITS IN LACTATED RINGERS INFUSION - SIMPLE MED
2.5000 [IU]/h | INTRAVENOUS | Status: DC
  Administered 2017-05-16: 2.5 [IU]/h via INTRAVENOUS
  Filled 2017-05-15: qty 1000

## 2017-05-15 MED ORDER — SODIUM CHLORIDE 0.45 % IV SOLN: INTRAVENOUS | Status: DC

## 2017-05-15 MED ORDER — EPHEDRINE 5 MG/ML INJ
10.0000 mg | INTRAVENOUS | Status: DC | PRN
Start: 2017-05-15 — End: 2017-05-16
  Filled 2017-05-15: qty 2

## 2017-05-15 MED ORDER — FENTANYL 2.5 MCG/ML BUPIVACAINE 1/10 % EPIDURAL INFUSION (WH - ANES)
14.0000 mL/h | INTRAMUSCULAR | Status: DC | PRN
  Administered 2017-05-15 – 2017-05-16 (×2): 14 mL/h via EPIDURAL
  Filled 2017-05-15: qty 100

## 2017-05-15 MED ORDER — INSULIN REGULAR BOLUS VIA INFUSION
0.0000 [IU] | Freq: Three times a day (TID) | INTRAVENOUS | Status: DC
  Filled 2017-05-15: qty 10

## 2017-05-15 MED ORDER — PHENYLEPHRINE 40 MCG/ML (10ML) SYRINGE FOR IV PUSH (FOR BLOOD PRESSURE SUPPORT)
80.0000 ug | PREFILLED_SYRINGE | INTRAVENOUS | Status: DC | PRN
  Filled 2017-05-15: qty 5

## 2017-05-15 MED ORDER — LIDOCAINE HCL (PF) 1 % IJ SOLN
INTRAMUSCULAR | Status: DC | PRN
  Administered 2017-05-15 (×2): 5 mL

## 2017-05-15 MED ORDER — OXYCODONE-ACETAMINOPHEN 5-325 MG PO TABS
1.0000 | ORAL_TABLET | ORAL | Status: DC | PRN
Start: 2017-05-15 — End: 2017-05-16

## 2017-05-15 MED ORDER — EPHEDRINE 5 MG/ML INJ
10.0000 mg | INTRAVENOUS | Status: DC | PRN
  Filled 2017-05-15: qty 2

## 2017-05-15 MED ORDER — DEXTROSE 50 % IV SOLN: 25.0000 mL | INTRAVENOUS | Status: DC | PRN

## 2017-05-15 MED ORDER — LACTATED RINGERS IV SOLN: 500.0000 mL | Freq: Once | INTRAVENOUS | Status: DC

## 2017-05-15 MED ORDER — ACETAMINOPHEN 325 MG PO TABS
650.0000 mg | ORAL_TABLET | ORAL | Status: DC | PRN
  Administered 2017-05-16: 650 mg via ORAL

## 2017-05-15 MED ORDER — PHENYLEPHRINE 40 MCG/ML (10ML) SYRINGE FOR IV PUSH (FOR BLOOD PRESSURE SUPPORT)
80.0000 ug | PREFILLED_SYRINGE | INTRAVENOUS | Status: DC | PRN
Start: 2017-05-15 — End: 2017-05-16
  Filled 2017-05-15: qty 5

## 2017-05-15 MED ORDER — DIPHENHYDRAMINE HCL 50 MG/ML IJ SOLN: 12.5000 mg | INTRAMUSCULAR | Status: DC | PRN

## 2017-05-15 MED ORDER — LIDOCAINE HCL (PF) 1 % IJ SOLN
30.0000 mL | INTRAMUSCULAR | Status: DC | PRN
  Filled 2017-05-15: qty 30

## 2017-05-15 MED ORDER — LACTATED RINGERS IV SOLN
INTRAVENOUS | Status: DC
  Administered 2017-05-15 – 2017-05-16 (×3): via INTRAVENOUS

## 2017-05-15 NOTE — H&P (Signed)
Angela Henderson is a 26 y.o. female G1P0 at 5838 weeks presenting for regular ctx, loss of mucus plug. No LOF, No vaginal bleeding + FM  OB History    Gravida Para Term Preterm AB Living   1             SAB TAB Ectopic Multiple Live Births                 Past Medical History:  Diagnosis Date  . DKA, type 2 ([facility]) 1st admission 11/02/2015  . Hyperlipidemia   . Type II diabetes mellitus ([facility]) dx'd 09/2015   mother and grandmother are type 1, dad type 2   Past Surgical History:  Procedure Laterality Date  . WISDOM TOOTH EXTRACTION     Family History: family history includes Cancer in her maternal aunt; Diabetes in her cousin, father, and mother; Hypertension in her father and mother. Social History:  reports that she has never smoked. She has never used smokeless tobacco. She reports that she does not drink alcohol or use drugs.     Maternal Diabetes: Yes:  Diabetes Type:  Pre-pregnancy, Insulin/Medication controlled Genetic Screening: Normal Maternal Ultrasounds/Referrals: Normal Fetal Ultrasounds or other Referrals:  Fetal echo, Referred to Materal Fetal Medicine  Maternal Substance Abuse:  No Significant Maternal Medications:  Meds include: Other: Insulin Significant Maternal Lab Results:  Lab values include: Group B Strep positive Other Comments:  None  Review of Systems  Unable to perform ROS: Other  Genitourinary: Negative for frequency.   Maternal Medical History:  Contractions: Onset was 6-12 hours ago.   Frequency: regular.   Duration is approximately 60 seconds.   Perceived severity is moderate.    Fetal activity: Perceived fetal activity is normal.   Last perceived fetal movement was within the past hour.    Prenatal complications: Infection.   Prenatal Complications - Diabetes: type 2. Diabetes is managed by intensive insulin program and insulin injections.      Dilation: 6 Effacement (%): 70 Station: -2 Exam by:: Avery DennisonBenji Stanley RN Blood pressure  128/82, pulse (!) 116, temperature 98.7 F (37.1 C), temperature source Oral, resp. rate 18, height 5\' 7"  (1.702 m), weight 100.7 kg (222 lb), last menstrual period 08/22/2016, SpO2 98 %. Maternal Exam:  Uterine Assessment: Contraction strength is moderate.  Contraction duration is 60 seconds. Contraction frequency is regular.   Abdomen: Patient reports no abdominal tenderness. Fundal height is 39cm.   Estimated fetal weight is 3800 grams.   Fetal presentation: vertex  Introitus: Normal vulva. Normal vagina.  Ferning test: not done.  Nitrazine test: not done. Amniotic fluid character: not assessed.  Pelvis: adequate for delivery.   Cervix: Cervix evaluated by digital exam.   6/70/-2 (in MAU)  Fetal Exam Fetal Monitor Review: Baseline rate: 150.  Variability: moderate (6-25 bpm).   Pattern: accelerations present and no decelerations.    Fetal State Assessment: Category I - tracings are normal.     Physical Exam  Prenatal labs: ABO, Rh:  O pos Antibody:  Neg Rubella:  Immune RPR:   NR HBsAg:   Neg HIV:   NR GBS:   POS  Assessment/Plan: 26 yo G1P0 at 38 weeks Type II DM on insulin in active labor Continuous monitoring Epidural on Demand Ampicillin IV for GBS prophylaxis Insulin coverage    Brettney Ficken STACIA 05/15/2017, 9:53 PM

## 2017-05-15 NOTE — Anesthesia Preprocedure Evaluation (Signed)
Anesthesia Evaluation  Patient identified by MRN, date of birth, ID band Patient awake    Reviewed: Allergy & Precautions, H&P , NPO status , Patient's Chart, lab work & pertinent test results  History of Anesthesia Complications Negative for: history of anesthetic complications  Airway Mallampati: II  TM Distance: >3 FB Neck ROM: full    Dental no notable dental hx. (+) Teeth Intact   Pulmonary neg pulmonary ROS,    Pulmonary exam normal breath sounds clear to auscultation       Cardiovascular negative cardio ROS Normal cardiovascular exam Rhythm:regular Rate:Normal     Neuro/Psych negative neurological ROS  negative psych ROS   GI/Hepatic negative GI ROS, Neg liver ROS,   Endo/Other  diabetes  Renal/GU negative Renal ROS  negative genitourinary   Musculoskeletal   Abdominal   Peds  Hematology negative hematology ROS (+)   Anesthesia Other Findings   Reproductive/Obstetrics (+) Pregnancy                             Anesthesia Physical Anesthesia Plan  ASA: II  Anesthesia Plan: Epidural   Post-op Pain Management:    Induction:   PONV Risk Score and Plan:   Airway Management Planned:   Additional Equipment:   Intra-op Plan:   Post-operative Plan:   Informed Consent: I have reviewed the patients History and Physical, chart, labs and discussed the procedure including the risks, benefits and alternatives for the proposed anesthesia with the patient or authorized representative who has indicated his/her understanding and acceptance.     Plan Discussed with:   Anesthesia Plan Comments:         Anesthesia Quick Evaluation  

## 2017-05-15 NOTE — Anesthesia Procedure Notes (Signed)
Epidural Patient location during procedure: OB  Staffing Anesthesiologist: Alvera Tourigny Performed: anesthesiologist   Preanesthetic Checklist Completed: patient identified, site marked, surgical consent, pre-op evaluation, timeout performed, IV checked, risks and benefits discussed and monitors and equipment checked  Epidural Patient position: sitting Prep: DuraPrep Patient monitoring: heart rate, continuous pulse ox and blood pressure Approach: right paramedian Location: L3-L4 Injection technique: LOR saline  Needle:  Needle type: Tuohy  Needle gauge: 17 G Needle length: 9 cm and 9 Needle insertion depth: 7 cm Catheter type: closed end flexible Catheter size: 20 Guage Catheter at skin depth: 11 cm Test dose: negative  Assessment Events: blood not aspirated, injection not painful, no injection resistance, negative IV test and no paresthesia  Additional Notes Patient identified. Risks/Benefits/Options discussed with patient including but not limited to bleeding, infection, nerve damage, paralysis, failed block, incomplete pain control, headache, blood pressure changes, nausea, vomiting, reactions to medication both or allergic, itching and postpartum back pain. Confirmed with bedside nurse the patient's most recent platelet count. Confirmed with patient that they are not currently taking any anticoagulation, have any bleeding history or any family history of bleeding disorders. Patient expressed understanding and wished to proceed. All questions were answered. Sterile technique was used throughout the entire procedure. Please see nursing notes for vital signs. Test dose was given through epidural needle and negative prior to continuing to dose epidural or start infusion. Warning signs of high block given to the patient including shortness of breath, tingling/numbness in hands, complete motor block, or any concerning symptoms with instructions to call for help. Patient was given  instructions on fall risk and not to get out of bed. All questions and concerns addressed with instructions to call with any issues.     

## 2017-05-15 NOTE — MAU Note (Signed)
Since 5:30 pm having contractions every 2-3 min apart. Lost mucus plug earlier in the day. Was 3.5 cm at office on Monday. No bleeding. No leaking. Baby moving well.

## 2017-05-16 ENCOUNTER — Encounter (HOSPITAL_COMMUNITY): Payer: Self-pay | Admitting: *Deleted

## 2017-05-16 LAB — GLUCOSE, CAPILLARY
GLUCOSE-CAPILLARY: 159 mg/dL — AB (ref 65–99)
GLUCOSE-CAPILLARY: 189 mg/dL — AB (ref 65–99)
Glucose-Capillary: 123 mg/dL — ABNORMAL HIGH (ref 65–99)
Glucose-Capillary: 131 mg/dL — ABNORMAL HIGH (ref 65–99)
Glucose-Capillary: 135 mg/dL — ABNORMAL HIGH (ref 65–99)
Glucose-Capillary: 141 mg/dL — ABNORMAL HIGH (ref 65–99)
Glucose-Capillary: 143 mg/dL — ABNORMAL HIGH (ref 65–99)
Glucose-Capillary: 149 mg/dL — ABNORMAL HIGH (ref 65–99)
Glucose-Capillary: 154 mg/dL — ABNORMAL HIGH (ref 65–99)
Glucose-Capillary: 168 mg/dL — ABNORMAL HIGH (ref 65–99)
Glucose-Capillary: 173 mg/dL — ABNORMAL HIGH (ref 65–99)

## 2017-05-16 LAB — ABO/RH: ABO/RH(D): O POS

## 2017-05-16 LAB — RPR: RPR: NONREACTIVE

## 2017-05-16 MED ORDER — IBUPROFEN 600 MG PO TABS
600.0000 mg | ORAL_TABLET | Freq: Four times a day (QID) | ORAL | Status: DC
  Administered 2017-05-16 – 2017-05-18 (×10): 600 mg via ORAL
  Filled 2017-05-16 (×10): qty 1

## 2017-05-16 MED ORDER — DIPHENHYDRAMINE HCL 25 MG PO CAPS: 25.0000 mg | ORAL_CAPSULE | Freq: Four times a day (QID) | ORAL | Status: DC | PRN

## 2017-05-16 MED ORDER — METHYLERGONOVINE MALEATE 0.2 MG/ML IJ SOLN: 0.2000 mg | INTRAMUSCULAR | Status: DC | PRN

## 2017-05-16 MED ORDER — WITCH HAZEL-GLYCERIN EX PADS: 1.0000 "application " | MEDICATED_PAD | CUTANEOUS | Status: DC | PRN

## 2017-05-16 MED ORDER — OXYCODONE-ACETAMINOPHEN 5-325 MG PO TABS
1.0000 | ORAL_TABLET | ORAL | Status: DC | PRN
  Administered 2017-05-16 – 2017-05-17 (×2): 1 via ORAL
  Filled 2017-05-16 (×2): qty 1

## 2017-05-16 MED ORDER — TETANUS-DIPHTH-ACELL PERTUSSIS 5-2.5-18.5 LF-MCG/0.5 IM SUSP: 0.5000 mL | Freq: Once | INTRAMUSCULAR | Status: DC

## 2017-05-16 MED ORDER — ONDANSETRON HCL 4 MG PO TABS: 4.0000 mg | ORAL_TABLET | ORAL | Status: DC | PRN

## 2017-05-16 MED ORDER — SENNOSIDES-DOCUSATE SODIUM 8.6-50 MG PO TABS
2.0000 | ORAL_TABLET | ORAL | Status: DC
  Administered 2017-05-16 – 2017-05-17 (×2): 2 via ORAL
  Filled 2017-05-16 (×2): qty 2

## 2017-05-16 MED ORDER — DIBUCAINE 1 % RE OINT: 1.0000 "application " | TOPICAL_OINTMENT | RECTAL | Status: DC | PRN

## 2017-05-16 MED ORDER — INSULIN ASPART 100 UNIT/ML ~~LOC~~ SOLN: 0.0000 [IU] | Freq: Every day | SUBCUTANEOUS | Status: DC

## 2017-05-16 MED ORDER — INSULIN ASPART 100 UNIT/ML ~~LOC~~ SOLN
4.0000 [IU] | Freq: Three times a day (TID) | SUBCUTANEOUS | Status: DC
  Administered 2017-05-16 – 2017-05-18 (×6): 4 [IU] via SUBCUTANEOUS

## 2017-05-16 MED ORDER — ONDANSETRON HCL 4 MG/2ML IJ SOLN: 4.0000 mg | INTRAMUSCULAR | Status: DC | PRN

## 2017-05-16 MED ORDER — ZOLPIDEM TARTRATE 5 MG PO TABS: 5.0000 mg | ORAL_TABLET | Freq: Every evening | ORAL | Status: DC | PRN

## 2017-05-16 MED ORDER — OXYCODONE-ACETAMINOPHEN 5-325 MG PO TABS: 2.0000 | ORAL_TABLET | ORAL | Status: DC | PRN

## 2017-05-16 MED ORDER — ALUM & MAG HYDROXIDE-SIMETH 200-200-20 MG/5ML PO SUSP
30.0000 mL | Freq: Four times a day (QID) | ORAL | Status: DC | PRN
  Filled 2017-05-16: qty 30

## 2017-05-16 MED ORDER — SIMETHICONE 80 MG PO CHEW
80.0000 mg | CHEWABLE_TABLET | ORAL | Status: DC | PRN
  Administered 2017-05-16: 80 mg via ORAL
  Filled 2017-05-16: qty 1

## 2017-05-16 MED ORDER — TERBUTALINE SULFATE 1 MG/ML IJ SOLN
0.2500 mg | Freq: Once | INTRAMUSCULAR | Status: DC | PRN
  Filled 2017-05-16: qty 1

## 2017-05-16 MED ORDER — SODIUM CHLORIDE 0.9 % IV SOLN
2.0000 g | Freq: Four times a day (QID) | INTRAVENOUS | Status: DC
  Administered 2017-05-16: 2 g via INTRAVENOUS
  Filled 2017-05-16 (×2): qty 2000

## 2017-05-16 MED ORDER — INSULIN ASPART 100 UNIT/ML ~~LOC~~ SOLN
0.0000 [IU] | Freq: Three times a day (TID) | SUBCUTANEOUS | Status: DC
  Administered 2017-05-16 – 2017-05-18 (×5): 2 [IU] via SUBCUTANEOUS

## 2017-05-16 MED ORDER — PRENATAL MULTIVITAMIN CH
1.0000 | ORAL_TABLET | Freq: Every day | ORAL | Status: DC
  Administered 2017-05-16 – 2017-05-18 (×3): 1 via ORAL
  Filled 2017-05-16 (×3): qty 1

## 2017-05-16 MED ORDER — METHYLERGONOVINE MALEATE 0.2 MG PO TABS: 0.2000 mg | ORAL_TABLET | ORAL | Status: DC | PRN

## 2017-05-16 MED ORDER — ACETAMINOPHEN 325 MG PO TABS
650.0000 mg | ORAL_TABLET | ORAL | Status: DC | PRN
  Administered 2017-05-17: 650 mg via ORAL
  Filled 2017-05-16: qty 2

## 2017-05-16 MED ORDER — BENZOCAINE-MENTHOL 20-0.5 % EX AERO
1.0000 "application " | INHALATION_SPRAY | CUTANEOUS | Status: DC | PRN
  Filled 2017-05-16: qty 56

## 2017-05-16 MED ORDER — OXYTOCIN 40 UNITS IN LACTATED RINGERS INFUSION - SIMPLE MED: 2.5000 [IU]/h | INTRAVENOUS | Status: DC | PRN

## 2017-05-16 MED ORDER — COCONUT OIL OIL: 1.0000 "application " | TOPICAL_OIL | Status: DC | PRN

## 2017-05-16 MED ORDER — DEXTROSE-NACL 5-0.45 % IV SOLN
INTRAVENOUS | Status: DC
  Administered 2017-05-16: 04:00:00 via INTRAVENOUS

## 2017-05-16 MED ORDER — OXYTOCIN 40 UNITS IN LACTATED RINGERS INFUSION - SIMPLE MED: 1.0000 m[IU]/min | INTRAVENOUS | Status: DC

## 2017-05-16 NOTE — Anesthesia Postprocedure Evaluation (Signed)
Anesthesia Post Note  Patient: Angela Henderson  Procedure(s) Performed: * No procedures listed *     Patient location during evaluation: Mother Baby Anesthesia Type: Epidural Level of consciousness: awake and alert Pain management: pain level controlled Vital Signs Assessment: post-procedure vital signs reviewed and stable Respiratory status: spontaneous breathing, nonlabored ventilation and respiratory function stable Cardiovascular status: stable Postop Assessment: no headache, no backache and epidural receding Anesthetic complications: no    Last Vitals:  Vitals:   05/16/17 1230 05/16/17 1637  BP: 131/69 129/75  Pulse: (!) 101 85  Resp: 18 16  Temp: 36.9 C 36.9 C    Last Pain:  Vitals:   05/16/17 1637  TempSrc: Oral  PainSc:    Pain Goal: Patients Stated Pain Goal: 0 (05/15/17 2129)               Junious SilkGILBERT,Shakenna Herrero

## 2017-05-16 NOTE — Plan of Care (Signed)
Problem: Bowel/Gastric: Goal: Gastrointestinal status will improve Outcome: Progressing Pt was having pain related to constipation, contacted on-call Dr. Mora ApplPinn to request to be able to give the medication early and an order for generic milanta

## 2017-05-16 NOTE — Consult Note (Signed)
[facility]  Delivery Note:  SVD    05/16/2017  9:23 AM  I was called to the delivery room at the request of the patient's obstetrician (Dr. Pinn) for respiratory distress in infant at ~ 7 minutes of age.  PRENATAL HX:  This is a 26 y/o G1P0 at 38 and 1/[redacted] weeks gestation who was admitted last night in active labor.  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  She is also GBS positive and received adequate treatment.  Infant delivered by SVD after AROM x 4 hours.    DELIVERY:  Per L and D staff, infant had weak cry and still had central cyanosis at ~ 7 minutes of life so NICU called.  We arrived at ~ 9 minutes of age.  Infant had good tone and HR but was cyanotic.  A pulse oximeter showed O2 saturations in the 70s.  Oxygen saturations quickly rose to 80s with stimulation as infant cried, and then gradually came up to 90-91% by 15 minutes of age.  Expect that this is just delayed transition and should continue to improve.   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  His work of breathing was comfortable and cardiac examination was normal.  After 20 minutes, baby left with nurse to assist parents with skin-to-skin care.  He will be left on pulse oximeter until Oxygen saturations are consistently in mid 90s.    _____________________ Electronically Signed By: Arvil Utz, MD Neonatologist  

## 2017-05-16 NOTE — Lactation Note (Signed)
This note was copied from a baby's chart. Lactation Consultation Note  Patient Name: Boy Marlow BaarsOceanna Mohar DGUYQ'IToday's Date: 05/16/2017 Reason for consult: Initial assessment Mom Type II DM, initial blood sugar 30, Dextrose gel given times 1, lab coming at 1400 for repeat blood sugar. Baby asleep STS on Mom. Awakened baby and after few attempts baby was able to latch in cross cradle to left breast. Some good suckling bursts noted, intermittent clicking. Mom has large nipples and baby has some difficulty obtaining good depth. Basic teaching reviewed with Mom, advised to BF with feeding ques, 8-12 times or more in 24 hours. If baby not giving feeding ques by 2-3 hours from last feeding, place baby STS and awaken baby to BF till baby has stable BS. Lactation brochure left for review, advised of OP services and support group. Encouraged to call for assist with feedings.   Maternal Data Has patient been taught Hand Expression?: Yes Does the patient have breastfeeding experience prior to this delivery?: No  Feeding Feeding Type: Breast Fed Length of feed: 30 min  LATCH Score/Interventions Latch: Repeated attempts needed to sustain latch, nipple held in mouth throughout feeding, stimulation needed to elicit sucking reflex. Intervention(s): Adjust position;Assist with latch;Breast massage;Breast compression  Audible Swallowing: A few with stimulation Intervention(s): Skin to skin;Hand expression;Alternate breast massage  Type of Nipple: Everted at rest and after stimulation  Comfort (Breast/Nipple): Soft / non-tender     Hold (Positioning): Assistance needed to correctly position infant at breast and maintain latch. Intervention(s): Breastfeeding basics reviewed;Support Pillows;Position options;Skin to skin  LATCH Score: 7  Lactation Tools Discussed/Used WIC Program: Yes   Consult Status Consult Status: Follow-up Date: 05/17/17 Follow-up type: In-patient    Alfred LevinsGranger, Shivali Quackenbush  Ann 05/16/2017, 2:16 PM

## 2017-05-16 NOTE — Progress Notes (Signed)
Hebert SohoOceanna N Wiederhold is a 26 y.o. G1P0 at 1248w1d by LMP admitted for active labor  Subjective: Patient comfortable with epidural  Objective: BP 113/67   Pulse 92   Temp 98.2 F (36.8 C) (Oral)   Resp 16   Ht 5\' 7"  (1.702 m)   Wt 100.7 kg (222 lb)   LMP 08/22/2016   SpO2 99%   BMI 34.77 kg/m  No intake/output data recorded. No intake/output data recorded.  FHT:  FHR: 150 bpm, variability: minimal ,  accelerations:  Abscent,  decelerations:  Absent UC:   irregular, every 6-8 minutes SVE:   Dilation: 6 Effacement (%): 90 Station: -2 Exam by:: Mervin Kung Stewart, RN  Labs: Lab Results  Component Value Date   WBC 12.2 (H) 05/15/2017   HGB 9.9 (L) 05/15/2017   HCT 31.8 (L) 05/15/2017   MCV 79.5 05/15/2017   PLT 245 05/15/2017    Assessment / Plan: Protracted active phase  AROM performed large amount of clear fluid FSE placed, small acceleration with scalp stimulation.  Order placed for Pitocin augmentation - 2/2  Labor: protracted active phase Preeclampsia:  no signs or symptoms of toxicity Fetal Wellbeing:  Category II Pain Control:  Epidural I/D:  n/a Anticipated MOD:  unsure  Grady Lucci STACIA 05/16/2017, 5:13 AM

## 2017-05-17 LAB — CBC
HEMATOCRIT: 25 % — AB (ref 36.0–46.0)
Hemoglobin: 8 g/dL — ABNORMAL LOW (ref 12.0–15.0)
MCH: 25.4 pg — ABNORMAL LOW (ref 26.0–34.0)
MCHC: 32 g/dL (ref 30.0–36.0)
MCV: 79.4 fL (ref 78.0–100.0)
Platelets: 212 10*3/uL (ref 150–400)
RBC: 3.15 MIL/uL — AB (ref 3.87–5.11)
RDW: 17.2 % — ABNORMAL HIGH (ref 11.5–15.5)
WBC: 15.1 10*3/uL — AB (ref 4.0–10.5)

## 2017-05-17 LAB — GLUCOSE, CAPILLARY
GLUCOSE-CAPILLARY: 121 mg/dL — AB (ref 65–99)
GLUCOSE-CAPILLARY: 139 mg/dL — AB (ref 65–99)
GLUCOSE-CAPILLARY: 124 mg/dL — AB (ref 65–99)
Glucose-Capillary: 127 mg/dL — ABNORMAL HIGH (ref 65–99)

## 2017-05-17 NOTE — Lactation Note (Signed)
This note was copied from a baby's chart. Lactation Consultation Note  Patient Name: Angela Marlow BaarsOceanna Doolan BJYNW'GToday's Date: 05/17/2017 Reason for consult: Follow-up assessment  Mom was set up w/a DEBP by this IBCLC. She was shown how to disassemble, clean, & reassemble the pump parts. Mom knows to pump q3h for 15 min.on preemie setting. Mom was observed for the majority of her 1st pumping session. Size 27 flanges are appropriate for her at this time & she was comfortable w/pumping. She does have WIC.   Mom reports mild breast changes w/pregnancy.   Alimentum is charted as the formula being given, but regular Similac was observed at bedside.   Angela Henderson, Angela Henderson [facility] 05/17/2017, 10:30 PM

## 2017-05-17 NOTE — Progress Notes (Signed)
Post Partum Day 1 Subjective: no complaints, up ad lib, voiding and tolerating PO  Objective: Blood pressure 112/61, pulse 79, temperature 97.8 F (36.6 C), temperature source Axillary, resp. rate 12, height 5\' 7"  (1.702 m), weight 100.7 kg (222 lb), last menstrual period 08/22/2016, SpO2 99 %, unknown if currently breastfeeding.  Physical Exam:  General: alert, cooperative and no distress Lochia: appropriate Uterine Fundus: firm Perineum: healing well, no significant drainage, no dehiscence DVT Evaluation: No evidence of DVT seen on physical exam. Negative Homan's sign. No cords or calf tenderness.   Recent Labs  05/15/17 2150 05/17/17 0522  HGB 9.9* 8.0*  HCT 31.8* 25.0*    Assessment/Plan: Plan for discharge tomorrow, Breastfeeding, Lactation consult and Circumcision in office  LOS: 2 days   Kynzley Dowson STACIA 05/17/2017, 9:16 AM

## 2017-05-18 LAB — GLUCOSE, CAPILLARY
GLUCOSE-CAPILLARY: 158 mg/dL — AB (ref 65–99)
Glucose-Capillary: 163 mg/dL — ABNORMAL HIGH (ref 65–99)
Glucose-Capillary: 133 mg/dL — ABNORMAL HIGH (ref 65–99)

## 2017-05-18 NOTE — Discharge Summary (Signed)
Obstetric Discharge Summary Reason for Admission: onset of labor Prenatal Procedures: ultrasound Intrapartum Procedures: spontaneous vaginal delivery Postpartum Procedures: none Complications-Operative and Postpartum: Right side wall laceration and 2nd degree perineal degree perineal laceration Hemoglobin  Date Value Ref Range Status  05/17/2017 8.0 (L) 12.0 - 15.0 g/dL Final   HCT  Date Value Ref Range Status  05/17/2017 25.0 (L) 36.0 - 46.0 % Final    Physical Exam:  General: alert and cooperative Lochia: appropriate Uterine Fundus: firm DVT Evaluation: No evidence of DVT seen on physical exam.  Discharge Diagnoses: Term Pregnancy-delivered  Discharge Information: Date: 05/18/2017 Activity: pelvic rest Diet: routine Medications: PNV, Ibuprofen, Iron and insulin at 50% predelivery dose Condition: stable Instructions: refer to practice specific booklet Discharge to: home Follow-up Information    Essie HartPinn, Walda, MD Follow up in 2 week(s).   Specialty:  Obstetrics and Gynecology Why:  or your PCP for blood sugar monitoring.  Also make 4 week postpartum appt with Dr. Mora ApplPinn. Contact information: [address] [place] [id number] [phone]           Newborn Data: Live born female  Birth Weight: 8 lb 5.3 oz (3779 g) APGAR: 6, 8  Home with mother.  Philip AspenCALLAHAN, Nadina Fomby 05/18/2017, 1:26 PM

## 2017-05-18 NOTE — Progress Notes (Signed)
UR chart review completed.  

## 2017-05-18 NOTE — Lactation Note (Addendum)
This note was copied from a baby's chart. Lactation Consultation Note  Patient Name: Angela Marlow BaarsOceanna Klimas XBJYN'WToday's Date: 05/18/2017   Baby 47 hrs old.  4.4% weight loss, output good.  Double phototherapy.  Bili 13.5 @ 5 am. Baby sleeping on Mom's chest at present.   Baby formula feeding by bottle, amounts increased to 30 ml.  Baby sleepy at the breast.   DEBP set up at bedside, Mom states she tried pumping.  Encouraged breast massage and hand expression along with double pumping every time baby is fed, >8 times per 24 hrs.  Talked about benefits of pumping with regards to milk supply. Offered assistance with latch,  Mom to call for help with latching prn.   Judee ClaraSmith, Emmarose Klinke E 05/18/2017, 8:49 AM

## 2017-05-20 ENCOUNTER — Ambulatory Visit: Payer: Self-pay

## 2017-05-20 NOTE — Lactation Note (Signed)
This note was copied from a baby's chart. Lactation Consultation Note  Patient Name: Angela Marlow BaarsOceanna Razavi UJWJX'BToday's Date: 05/20/2017   [facility]. [facility] the interim, but she is content with using a hand pump for the time being.  Mom is pumping enough breast milk to feed infant. Angela Henderson has gained weight over the last couple of days. She has no questions about the pump, etc.   Infant had gone from 0530 to 1200 today without a feeding. I explained to Mom that infant should not go so long between feedings. She verbalized understanding.   Angela Henderson, Kayle Passarelli [facility] 05/20/2017, 1:10 PM

## 2017-08-26 ENCOUNTER — Encounter: Payer: Self-pay | Admitting: Endocrinology

## 2017-08-26 LAB — HM DIABETES EYE EXAM

## 2017-10-23 ENCOUNTER — Ambulatory Visit: Payer: Self-pay | Admitting: Endocrinology

## 2017-10-23 ENCOUNTER — Encounter: Payer: Self-pay | Admitting: Endocrinology

## 2017-10-29 ENCOUNTER — Telehealth: Payer: Self-pay | Admitting: Endocrinology

## 2017-10-29 NOTE — Telephone Encounter (Signed)
Patient dismissed from [facility] by Ajay Kumar MD , effective October 23, 2017. Dismissal letter sent out by certified / registered mail.  °daj °

## 2017-12-02 NOTE — Telephone Encounter (Signed)
Certified dismissal letter returned as undeliverable, unclaimed, return to sender after three attempts by [employer] on December 02, 2017. Letter placed in another envelope and resent as 1st class mail which does not require a signature. °daj °

## 2018-02-03 ENCOUNTER — Other Ambulatory Visit (HOSPITAL_COMMUNITY): Payer: Self-pay | Admitting: Chiropractic Medicine

## 2018-02-03 ENCOUNTER — Ambulatory Visit (HOSPITAL_COMMUNITY)
Admission: RE | Admit: 2018-02-03 | Discharge: 2018-02-03 | Disposition: A | Discharge status: Home / Self Care | Payer: BLUE CROSS/BLUE SHIELD | Source: Ambulatory Visit | Attending: Chiropractic Medicine | Admitting: Chiropractic Medicine

## 2018-02-03 DIAGNOSIS — M25531 Pain in right wrist: Secondary | ICD-10-CM

## 2018-02-03 MED ORDER — BUTAMBEN-TETRACAINE-BENZOCAINE 2-2-14 % EX AERO
INHALATION_SPRAY | CUTANEOUS | Status: AC
  Filled 2018-02-03: qty 20

## 2018-02-04 ENCOUNTER — Other Ambulatory Visit (HOSPITAL_COMMUNITY): Payer: Self-pay | Admitting: Chiropractic Medicine

## 2018-02-04 ENCOUNTER — Ambulatory Visit (HOSPITAL_COMMUNITY)
Admission: RE | Admit: 2018-02-04 | Discharge: 2018-02-04 | Disposition: A | Discharge status: Home / Self Care | Payer: BLUE CROSS/BLUE SHIELD | Source: Ambulatory Visit | Attending: Chiropractic Medicine | Admitting: Chiropractic Medicine

## 2018-02-04 DIAGNOSIS — M5134 Other intervertebral disc degeneration, thoracic region: Secondary | ICD-10-CM | POA: Insufficient documentation

## 2018-02-04 DIAGNOSIS — M545 Low back pain: Secondary | ICD-10-CM | POA: Diagnosis present

## 2018-02-04 DIAGNOSIS — M542 Cervicalgia: Secondary | ICD-10-CM

## 2018-02-04 DIAGNOSIS — M4186 Other forms of scoliosis, lumbar region: Secondary | ICD-10-CM | POA: Insufficient documentation

## 2018-02-04 DIAGNOSIS — M4050 Lordosis, unspecified, site unspecified: Secondary | ICD-10-CM | POA: Insufficient documentation

## 2018-02-04 DIAGNOSIS — M4184 Other forms of scoliosis, thoracic region: Secondary | ICD-10-CM | POA: Insufficient documentation

## 2018-02-04 DIAGNOSIS — G8929 Other chronic pain: Secondary | ICD-10-CM

## 2018-02-04 DIAGNOSIS — M549 Dorsalgia, unspecified: Secondary | ICD-10-CM

## 2018-02-04 DIAGNOSIS — M546 Pain in thoracic spine: Secondary | ICD-10-CM | POA: Diagnosis present
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
## 2018-05-28 ENCOUNTER — Ambulatory Visit (INDEPENDENT_AMBULATORY_CARE_PROVIDER_SITE_OTHER): Payer: BLUE CROSS/BLUE SHIELD | Admitting: Family Medicine

## 2018-05-28 ENCOUNTER — Encounter: Payer: Self-pay | Admitting: Family Medicine

## 2018-05-28 VITALS — BP 134/80 | HR 76 | Temp 98.0°F | Ht 67.0 in | Wt 208.0 lb

## 2018-05-28 DIAGNOSIS — N390 Urinary tract infection, site not specified: Secondary | ICD-10-CM

## 2018-05-28 DIAGNOSIS — R319 Hematuria, unspecified: Secondary | ICD-10-CM

## 2018-05-28 DIAGNOSIS — R8290 Unspecified abnormal findings in urine: Secondary | ICD-10-CM | POA: Diagnosis not present

## 2018-05-28 DIAGNOSIS — E669 Obesity, unspecified: Secondary | ICD-10-CM

## 2018-05-28 DIAGNOSIS — Z09 Encounter for follow-up examination after completed treatment for conditions other than malignant neoplasm: Secondary | ICD-10-CM

## 2018-05-28 DIAGNOSIS — E1165 Type 2 diabetes mellitus with hyperglycemia: Secondary | ICD-10-CM

## 2018-05-28 DIAGNOSIS — E66811 Obesity, class 1: Secondary | ICD-10-CM

## 2018-05-28 LAB — POCT URINALYSIS DIP (MANUAL ENTRY)
Bilirubin, UA: NEGATIVE
Glucose, UA: NEGATIVE mg/dL
Ketones, POC UA: NEGATIVE mg/dL
Nitrite, UA: NEGATIVE
Protein Ur, POC: NEGATIVE mg/dL
Spec Grav, UA: 1.02 (ref 1.010–1.025)
Urobilinogen, UA: 0.2 E.U./dL
pH, UA: 5.5 (ref 5.0–8.0)

## 2018-05-28 LAB — POCT GLYCOSYLATED HEMOGLOBIN (HGB A1C): Hemoglobin A1C: 6.3 % — AB (ref 4.0–5.6)

## 2018-05-28 MED ORDER — ASSURE PRO BLOOD GLUCOSE METER DEVI: 1.0000 | Freq: Once | 0 refills | Status: AC

## 2018-05-28 MED ORDER — SULFAMETHOXAZOLE-TRIMETHOPRIM 800-160 MG PO TABS
1.0000 | ORAL_TABLET | Freq: Two times a day (BID) | ORAL | 0 refills | Status: DC
Start: 2018-05-28 — End: 2018-07-02

## 2018-05-28 NOTE — Patient Instructions (Addendum)
DASH Eating Plan DASH stands for "Dietary Approaches to Stop Hypertension." The DASH eating plan is a healthy eating plan that has been shown to reduce high blood pressure (hypertension). It may also reduce your risk for type 2 diabetes, heart disease, and stroke. The DASH eating plan may also help with weight loss. What are tips for following this plan? General guidelines  Avoid eating more than 2,300 mg (milligrams) of salt (sodium) a day. If you have hypertension, you may need to reduce your sodium intake to 1,500 mg a day.  Limit alcohol intake to no more than 1 drink a day for nonpregnant women and 2 drinks a day for men. One drink equals 12 oz of beer, 5 oz of wine, or 1 oz of hard liquor.  Work with your health care provider to maintain a healthy body weight or to lose weight. Ask what an ideal weight is for you.  Get at least 30 minutes of exercise that causes your heart to beat faster (aerobic exercise) most days of the week. Activities may include walking, swimming, or biking.  Work with your health care provider or diet and nutrition specialist (dietitian) to adjust your eating plan to your individual calorie needs. Reading food labels  Check food labels for the amount of sodium per serving. Choose foods with less than 5 percent of the Daily Value of sodium. Generally, foods with less than 300 mg of sodium per serving fit into this eating plan.  To find whole grains, look for the word "whole" as the first word in the ingredient list. Shopping  Buy products labeled as "low-sodium" or "no salt added."  Buy fresh foods. Avoid canned foods and premade or frozen meals. Cooking  Avoid adding salt when cooking. Use salt-free seasonings or herbs instead of table salt or sea salt. Check with your health care provider or pharmacist before using salt substitutes.  Do not fry foods. Cook foods using healthy methods such as baking, boiling, grilling, and broiling instead.  Cook with  heart-healthy oils, such as olive, canola, soybean, or sunflower oil. Meal planning   Eat a balanced diet that includes: ? 5 or more servings of fruits and vegetables each day. At each meal, try to fill half of your plate with fruits and vegetables. ? Up to 6-8 servings of whole grains each day. ? Less than 6 oz of lean meat, poultry, or fish each day. A 3-oz serving of meat is about the same size as a deck of cards. One egg equals 1 oz. ? 2 servings of low-fat dairy each day. ? A serving of nuts, seeds, or beans 5 times each week. ? Heart-healthy fats. Healthy fats called Omega-3 fatty acids are found in foods such as flaxseeds and coldwater fish, like sardines, salmon, and mackerel.  Limit how much you eat of the following: ? Canned or prepackaged foods. ? Food that is high in trans fat, such as fried foods. ? Food that is high in saturated fat, such as fatty meat. ? Sweets, desserts, sugary drinks, and other foods with added sugar. ? Full-fat dairy products.  Do not salt foods before eating.  Try to eat at least 2 vegetarian meals each week.  Eat more home-cooked food and less restaurant, buffet, and fast food.  When eating at a restaurant, ask that your food be prepared with less salt or no salt, if possible. What foods are recommended? The items listed may not be a complete list. Talk with your dietitian about what   dietary choices are best for you. Grains Whole-grain or whole-wheat bread. Whole-grain or whole-wheat pasta. Brown rice. Oatmeal. Quinoa. Bulgur. Whole-grain and low-sodium cereals. Pita bread. Low-fat, low-sodium crackers. Whole-wheat flour tortillas. Vegetables Fresh or frozen vegetables (raw, steamed, roasted, or grilled). Low-sodium or reduced-sodium tomato and vegetable juice. Low-sodium or reduced-sodium tomato sauce and tomato paste. Low-sodium or reduced-sodium canned vegetables. Fruits All fresh, dried, or frozen fruit. Canned fruit in natural juice (without  added sugar). Meat and other protein foods Skinless chicken or turkey. Ground chicken or turkey. Pork with fat trimmed off. Fish and seafood. Egg whites. Dried beans, peas, or lentils. Unsalted nuts, nut butters, and seeds. Unsalted canned beans. Lean cuts of beef with fat trimmed off. Low-sodium, lean deli meat. Dairy Low-fat (1%) or fat-free (skim) milk. Fat-free, low-fat, or reduced-fat cheeses. Nonfat, low-sodium ricotta or cottage cheese. Low-fat or nonfat yogurt. Low-fat, low-sodium cheese. Fats and oils Soft margarine without trans fats. Vegetable oil. Low-fat, reduced-fat, or light mayonnaise and salad dressings (reduced-sodium). Canola, safflower, olive, soybean, and sunflower oils. Avocado. Seasoning and other foods Herbs. Spices. Seasoning mixes without salt. Unsalted popcorn and pretzels. Fat-free sweets. What foods are not recommended? The items listed may not be a complete list. Talk with your dietitian about what dietary choices are best for you. Grains Baked goods made with fat, such as croissants, muffins, or some breads. Dry pasta or rice meal packs. Vegetables Creamed or fried vegetables. Vegetables in a cheese sauce. Regular canned vegetables (not low-sodium or reduced-sodium). Regular canned tomato sauce and paste (not low-sodium or reduced-sodium). Regular tomato and vegetable juice (not low-sodium or reduced-sodium). Pickles. Olives. Fruits Canned fruit in a light or heavy syrup. Fried fruit. Fruit in cream or butter sauce. Meat and other protein foods Fatty cuts of meat. Ribs. Fried meat. Bacon. Sausage. Bologna and other processed lunch meats. Salami. Fatback. Hotdogs. Bratwurst. Salted nuts and seeds. Canned beans with added salt. Canned or smoked fish. Whole eggs or egg yolks. Chicken or turkey with skin. Dairy Whole or 2% milk, cream, and half-and-half. Whole or full-fat cream cheese. Whole-fat or sweetened yogurt. Full-fat cheese. Nondairy creamers. Whipped toppings.  Processed cheese and cheese spreads. Fats and oils Butter. Stick margarine. Lard. Shortening. Ghee. Bacon fat. Tropical oils, such as coconut, palm kernel, or palm oil. Seasoning and other foods Salted popcorn and pretzels. Onion salt, garlic salt, seasoned salt, table salt, and sea salt. Worcestershire sauce. Tartar sauce. Barbecue sauce. Teriyaki sauce. Soy sauce, including reduced-sodium. Steak sauce. Canned and packaged gravies. Fish sauce. Oyster sauce. Cocktail sauce. Horseradish that you find on the shelf. Ketchup. Mustard. Meat flavorings and tenderizers. Bouillon cubes. Hot sauce and Tabasco sauce. Premade or packaged marinades. Premade or packaged taco seasonings. Relishes. Regular salad dressings. Where to find more information:  National Heart, Lung, and Blood Institute: www.nhlbi.nih.gov  American Heart Association: www.heart.org Summary  The DASH eating plan is a healthy eating plan that has been shown to reduce high blood pressure (hypertension). It may also reduce your risk for type 2 diabetes, heart disease, and stroke.  With the DASH eating plan, you should limit salt (sodium) intake to 2,300 mg a day. If you have hypertension, you may need to reduce your sodium intake to 1,500 mg a day.  When on the DASH eating plan, aim to eat more fresh fruits and vegetables, whole grains, lean proteins, low-fat dairy, and heart-healthy fats.  Work with your health care provider or diet and nutrition specialist (dietitian) to adjust your eating plan to your individual   calorie needs. This information is not intended to replace advice given to you by your health care provider. Make sure you discuss any questions you have Sulfamethoxazole; Trimethoprim, SMX-TMP tablets What is this medicine? SULFAMETHOXAZOLE; TRIMETHOPRIM or SMX-TMP (suhl fuh meth OK suh zohl; trye METH oh prim) is a combination of a sulfonamide antibiotic and a second antibiotic, trimethoprim. It is used to treat or prevent  certain kinds of bacterial infections. It will not work for colds, flu, or other viral infections. This medicine may be used for other purposes; ask your health care provider or pharmacist if you have questions. COMMON BRAND NAME(S): Bacter-Aid DS, Bactrim, Bactrim DS, Septra, Septra DS What should I tell my health care provider before I take this medicine? They need to know if you have any of these conditions: -anemia -asthma -being treated with anticonvulsants -if you frequently drink alcohol containing drinks -kidney disease -liver disease -low level of folic acid or [id number] dehydrogenase -poor nutrition or malabsorption -porphyria -severe allergies -thyroid disorder -an unusual or allergic reaction to sulfamethoxazole, trimethoprim, sulfa drugs, other medicines, foods, dyes, or preservatives -pregnant or trying to get pregnant -breast-feeding How should I use this medicine? Take this medicine by mouth with a full glass of water. Follow the directions on the prescription label. Take your medicine at regular intervals. Do not take it more often than directed. Do not skip doses or stop your medicine early. Talk to your pediatrician regarding the use of this medicine in children. Special care may be needed. This medicine has been used in children as young as 86 months of age. Overdosage: If you think you have taken too much of this medicine contact a poison control center or emergency room at once. NOTE: This medicine is only for you. Do not share this medicine with others. What if I miss a dose? If you miss a dose, take it as soon as you can. If it is almost time for your next dose, take only that dose. Do not take double or extra doses. What may interact with this medicine? Do not take this medicine with any of the following medications: -aminobenzoate potassium -dofetilide -metronidazole This medicine may also interact with the following medications: -ACE inhibitors like  benazepril, enalapril, lisinopril, and ramipril -birth control pills -cyclosporine -digoxin -diuretics -indomethacin -medicines for diabetes -methenamine -methotrexate -phenytoin -potassium supplements -pyrimethamine -sulfinpyrazone -tricyclic antidepressants -warfarin This list may not describe all possible interactions. Give your health care provider a list of all the medicines, herbs, non-prescription drugs, or dietary supplements you use. Also tell them if you smoke, drink alcohol, or use illegal drugs. Some items may interact with your medicine. What should I watch for while using this medicine? Tell your doctor or health care professional if your symptoms do not improve. Drink several glasses of water a day to reduce the risk of kidney problems. Do not treat diarrhea with over the counter products. Contact your doctor if you have diarrhea that lasts more than 2 days or if it is severe and watery. This medicine can make you more sensitive to the sun. Keep out of the sun. If you cannot avoid being in the sun, wear protective clothing and use a sunscreen. Do not use sun lamps or tanning beds/booths. What side effects may I notice from receiving this medicine? Side effects that you should report to your doctor or health care professional as soon as possible: -allergic reactions like skin rash or hives, swelling of the face, lips, or tongue -breathing problems -fever or  chills, sore throat -irregular heartbeat, chest pain -joint or muscle pain -pain or difficulty passing urine -red pinpoint spots on skin -redness, blistering, peeling or loosening of the skin, including inside the mouth -unusual bleeding or bruising -unusually weak or tired -yellowing of the eyes or skin Side effects that usually do not require medical attention (report to your doctor or health care professional if they continue or are bothersome): -diarrhea -dizziness -headache -loss of appetite -nausea,  vomiting -nervousness This list may not describe all possible side effects. Call your doctor for medical advice about side effects. You may report side effects to FDA at 1-800-FDA-1088. Where should I keep my medicine? Keep out of the reach of children. Store at room temperature between 20 to 25 degrees C (68 to 77 degrees F). Protect from light. Throw away any unused medicine after the expiration date. NOTE: This sheet is a summary. It may not cover all possible information. If you have questions about this medicine, talk to your doctor, pharmacist, or health care provider.  2018 Elsevier/Gold Standard (2013-06-10 14:38:26) with your health care provider. Document Released: 10/23/2011 Document Revised: 10/27/2016 Document Reviewed: 10/27/2016 Elsevier Interactive Patient Education  [employer]2018 Elsevier Inc.

## 2018-05-28 NOTE — Progress Notes (Signed)
New Patient-Establish Care  Subjective:    Patient ID: Angela Henderson, female    DOB: Jan 29, 1991, 29 y.o.   MRN: [id number]   PCP: Kathe Becton, NP  Chief Complaint  Patient presents with  . Establish Care    HPI  Angela Henderson has a past medical history of Diabetes, Hyperlipidemia, Thyroid Disease, and Hypertension. She is here today to Establish Care.   Current Status: Today she is doing well with no complaints. She is here today with her 77 year old son. She states that she is currently not on birth control. She is not taking any medications at this time.   She denies fevers, chills, fatigue, recent infections, weight loss, and night sweats.   She has not had any headaches, visual changes, dizziness, and falls.   No chest pain, heart palpitations, cough and shortness of breath reported. No reports of GI problems such as nausea, vomiting, diarrhea, and constipation. She has no reports of blood in stools, dysuria and hematuria.   No depression or anxiety.   She denies pain today.    Past Medical History:  Diagnosis Date  . DKA, type 2 ([facility]) 1st admission 11/02/2015  . Hyperlipidemia   . Type II diabetes mellitus ([place]) dx'd 09/2015   mother and grandmother are type 1, dad type 2    Social History   Socioeconomic History  . Marital status: Single    Spouse name: Not on file  . Number of children: Not on file  . Years of education: Not on file  . Highest education level: Not on file  Occupational History  . Not on file  Social Needs  . Financial resource strain: Not on file  . Food insecurity:    Worry: Not on file    Inability: Not on file  . Transportation needs:    Medical: Not on file    Non-medical: Not on file  Tobacco Use  . Smoking status: Never Smoker  . Smokeless tobacco: Never Used  Substance and Sexual Activity  . Alcohol use: No    Alcohol/week: 0.0 oz    Comment: 11/02/2015 "1-2 drinks on weekend maybe once/month"  . Drug use: No  . Sexual  activity: Yes  Lifestyle  . Physical activity:    Days per week: Not on file    Minutes per session: Not on file  . Stress: Not on file  Relationships  . Social connections:    Talks on phone: Not on file    Gets together: Not on file    Attends religious service: Not on file    Active member of club or organization: Not on file    Attends meetings of clubs or organizations: Not on file    Relationship status: Not on file  . Intimate partner violence:    Fear of current or ex partner: Not on file    Emotionally abused: Not on file    Physically abused: Not on file    Forced sexual activity: Not on file  Other Topics Concern  . Not on file  Social History Narrative  . Not on file    Past Surgical History:  Procedure Laterality Date  . WISDOM TOOTH EXTRACTION     Immunization History  Administered Date(s) Administered  . Influenza Whole 11/01/2007, 08/24/2008  . Influenza-Unspecified 09/18/2015  . Pneumococcal Polysaccharide-23 11/03/2015  . Td 07/03/2010  . Tdap 02/20/2017    No outpatient medications have been marked as taking for the 05/28/18 encounter (Office Visit) with  Azzie Glatter, FNP.    No Known Allergies  BP 134/80 (BP Location: Left Arm, Patient Position: Sitting, Cuff Size: Large)   Pulse 76   Temp 98 F (36.7 C) (Oral)   Ht 5' 7"  (1.702 m)   Wt 208 lb (94.3 kg)   LMP 05/14/2018   SpO2 100%   BMI 32.58 kg/m   Review of Systems  Constitutional: Negative.   HENT: Negative.   Eyes: Negative.   Respiratory: Negative.   Cardiovascular: Negative.   Gastrointestinal: Negative.   Endocrine: Negative.   Genitourinary: Negative.   Musculoskeletal: Negative.   Skin: Negative.   Allergic/Immunologic: Negative.   Neurological: Negative.   Hematological: Negative.   Psychiatric/Behavioral: Negative.    Objective:   Physical Exam  Constitutional: She is oriented to person, place, and time. She appears well-developed and well-nourished.  HENT:   Head: Normocephalic and atraumatic.  Right Ear: External ear normal.  Left Ear: External ear normal.  Nose: Nose normal.  Mouth/Throat: Oropharynx is clear and moist.  Eyes: Pupils are equal, round, and reactive to light. Conjunctivae and EOM are normal.  Neck: Neck supple.  Cardiovascular: Normal rate, regular rhythm, normal heart sounds and intact distal pulses.  Pulmonary/Chest: Effort normal and breath sounds normal.  Abdominal: Soft. Bowel sounds are normal.  Musculoskeletal: Normal range of motion.  Neurological: She is alert and oriented to person, place, and time.  Skin: Skin is warm. Capillary refill takes less than 2 seconds.  Psychiatric: She has a normal mood and affect. Her behavior is normal. Judgment and thought content normal.  Nursing note and vitals reviewed.  Assessment & Plan:   1. Uncontrolled type 2 diabetes mellitus with hyperglycemia, without long-term current use of insulin (HCC) Hgb A1c has decreased to 6.3 today, from 6.8 ine year ago. We will encourage her to continue to modify her diet and increase physical activity. She will check blood glucose levels at least 2 times a day.  - POCT glycosylated hemoglobin (Hb A1C) - POCT urinalysis dipstick - Blood Glucose Monitoring Suppl (ASSURE PRO BLOOD GLUCOSE METER) DEVI; 1 kit by Does not apply route once for 1 dose.  Dispense: 1 each; Refill: 0  2. Abnormal urinalysis - Urine Culture  3. Urinary tract infection with hematuria, site unspecified - sulfamethoxazole-trimethoprim (BACTRIM DS,SEPTRA DS) 800-160 MG tablet; Take 1 tablet by mouth 2 (two) times daily.  Dispense: 14 tablet; Refill: 0  4. Obesity (BMI 30.0-34.9) She will began to follow a heart healthy diet. She will decrease high sodium intake, excessive alcohol intake, increase potassium intake, smoking cessation, and increase physical activity. Follow DASH diet.   5. Follow up She will follow up in 1 month.   Meds ordered this encounter   Medications  . Blood Glucose Monitoring Suppl (ASSURE PRO BLOOD GLUCOSE METER) DEVI    Sig: 1 kit by Does not apply route once for 1 dose.    Dispense:  1 each    Refill:  0  . sulfamethoxazole-trimethoprim (BACTRIM DS,SEPTRA DS) 800-160 MG tablet    Sig: Take 1 tablet by mouth 2 (two) times daily.    Dispense:  14 tablet    Refill:  0    Kathe Becton,  MSN, FNP-C Patient [facility] [address] [place], [place] [id number] [phone]

## 2018-05-30 LAB — URINE CULTURE

## 2018-06-25 ENCOUNTER — Ambulatory Visit: Payer: Medicaid Other | Admitting: Family Medicine

## 2018-07-02 ENCOUNTER — Telehealth: Payer: Self-pay

## 2018-07-02 ENCOUNTER — Encounter: Payer: Self-pay | Admitting: Family Medicine

## 2018-07-02 ENCOUNTER — Ambulatory Visit (INDEPENDENT_AMBULATORY_CARE_PROVIDER_SITE_OTHER): Payer: BLUE CROSS/BLUE SHIELD | Admitting: Family Medicine

## 2018-07-02 VITALS — BP 136/80 | HR 78 | Temp 97.9°F | Ht 67.0 in | Wt 207.0 lb

## 2018-07-02 DIAGNOSIS — R7303 Prediabetes: Secondary | ICD-10-CM | POA: Diagnosis not present

## 2018-07-02 DIAGNOSIS — E669 Obesity, unspecified: Secondary | ICD-10-CM

## 2018-07-02 DIAGNOSIS — E66811 Obesity, class 1: Secondary | ICD-10-CM

## 2018-07-02 DIAGNOSIS — Z09 Encounter for follow-up examination after completed treatment for conditions other than malignant neoplasm: Secondary | ICD-10-CM

## 2018-07-02 DIAGNOSIS — N390 Urinary tract infection, site not specified: Secondary | ICD-10-CM | POA: Diagnosis not present

## 2018-07-02 LAB — POCT URINALYSIS DIP (MANUAL ENTRY)
Bilirubin, UA: NEGATIVE
Glucose, UA: NEGATIVE mg/dL
Ketones, POC UA: NEGATIVE mg/dL
Nitrite, UA: NEGATIVE
Spec Grav, UA: 1.02 (ref 1.010–1.025)
Urobilinogen, UA: 0.2 E.U./dL
pH, UA: 5.5 (ref 5.0–8.0)

## 2018-07-02 LAB — POCT URINE PREGNANCY: Preg Test, Ur: NEGATIVE

## 2018-07-02 MED ORDER — SULFAMETHOXAZOLE-TRIMETHOPRIM 800-160 MG PO TABS: 1.0000 | ORAL_TABLET | Freq: Two times a day (BID) | ORAL | 0 refills | Status: DC

## 2018-07-02 NOTE — Telephone Encounter (Signed)
Please order nexaplanon for patient to have placed at office.

## 2018-07-02 NOTE — Telephone Encounter (Signed)
Patient doesn't need authorization for Nexplanon.

## 2018-07-02 NOTE — Telephone Encounter (Signed)
-----   Message from Kallie LocksNatalie M Stroud, FNP sent at 07/02/2018 10:44 AM EDT ----- Regarding: "Nexplanon" Please check with insurance to see if will cover Nexplanon.   Thank you.

## 2018-07-02 NOTE — Progress Notes (Signed)
Follow Up  Subjective:    Patient ID: Angela Henderson, female    DOB: 04/22/1991, 27 y.o.   MRN: [id number]   Chief Complaint  Patient presents with  . Follow-up    1 month on conditions    HPI  Angela Henderson has a past medical history of Diabetes and Hyperlipidemia. She is here today for follow up.  Current Status: Since her last office visit, she is doing well with no complaints. She continues to eat a healthier diet to decrease blood glucose levels. She is considering Nexplanon for birth control. She is accompanied today by her 29 year old son.   She denies fevers, chills, fatigue, recent infections, weight loss, and night sweats.   She has not had any headaches, visual changes, dizziness, and falls.   No chest pain, heart palpitations, cough and shortness of breath reported.   No reports of GI problems such as nausea, vomiting, diarrhea, and constipation. She has no reports of blood in stools, dysuria and hematuria.   No depression or anxiety reported.    She denies pain today.    Past Medical History:  Diagnosis Date  . DKA, type 2 (HCC) 1st admission 11/02/2015  . Hyperlipidemia   . Type II diabetes mellitus (HCC) dx'd 09/2015   mother and grandmother are type 1, dad type 2    Family History  Problem Relation Age of Onset  . Diabetes Mother        Type I  . Hypertension Mother   . Diabetes Father   . Hypertension Father   . Diabetes Cousin        Type I  . Cancer Maternal Aunt     Social History   Socioeconomic History  . Marital status: Single    Spouse name: Not on file  . Number of children: Not on file  . Years of education: Not on file  . Highest education level: Not on file  Occupational History  . Not on file  Social Needs  . Financial resource strain: Not on file  . Food insecurity:    Worry: Not on file    Inability: Not on file  . Transportation needs:    Medical: Not on file    Non-medical: Not on file  Tobacco Use  . Smoking  status: Never Smoker  . Smokeless tobacco: Never Used  Substance and Sexual Activity  . Alcohol use: No    Alcohol/week: 0.0 standard drinks    Comment: 11/02/2015 "1-2 drinks on weekend maybe once/month"  . Drug use: No  . Sexual activity: Yes  Lifestyle  . Physical activity:    Days per week: Not on file    Minutes per session: Not on file  . Stress: Not on file  Relationships  . Social connections:    Talks on phone: Not on file    Gets together: Not on file    Attends religious service: Not on file    Active member of club or organization: Not on file    Attends meetings of clubs or organizations: Not on file    Relationship status: Not on file  . Intimate partner violence:    Fear of current or ex partner: Not on file    Emotionally abused: Not on file    Physically abused: Not on file    Forced sexual activity: Not on file  Other Topics Concern  . Not on file  Social History Narrative  . Not on file  Past Surgical History:  Procedure Laterality Date  . WISDOM TOOTH EXTRACTION      Immunization History  Administered Date(s) Administered  . Influenza Whole 11/01/2007, 08/24/2008  . Influenza-Unspecified 09/18/2015  . Pneumococcal Polysaccharide-23 11/03/2015  . Td 07/03/2010  . Tdap 02/20/2017   No outpatient medications have been marked as taking for the 07/02/18 encounter (Office Visit) with Kallie LocksStroud, Murrel Bertram M, FNP.    No Known Allergies   BP 136/80 (BP Location: Left Arm, Patient Position: Sitting, Cuff Size: Large)   Pulse 78   Temp 97.9 F (36.6 C) (Oral)   Ht 5\' 7"  (1.702 m)   Wt 207 lb (93.9 kg)   LMP 06/17/2018   SpO2 100%   BMI 32.42 kg/m    Review of Systems  Constitutional: Negative.   HENT: Negative.   Eyes: Negative.   Respiratory: Negative.   Cardiovascular: Negative.   Gastrointestinal: Negative.   Endocrine: Negative.   Genitourinary: Negative.   Musculoskeletal: Negative.   Skin: Negative.   Allergic/Immunologic: Negative.    Neurological: Negative.   Hematological: Negative.   Psychiatric/Behavioral: Negative.    Objective:   Physical Exam  Constitutional: She is oriented to person, place, and time. She appears well-developed and well-nourished.  HENT:  Head: Normocephalic and atraumatic.  Right Ear: External ear normal.  Left Ear: External ear normal.  Nose: Nose normal.  Mouth/Throat: Oropharynx is clear and moist.  Eyes: Pupils are equal, round, and reactive to light. Conjunctivae and EOM are normal.  Neck: Normal range of motion. Neck supple.  Cardiovascular: Normal rate, regular rhythm, normal heart sounds and intact distal pulses.  Pulmonary/Chest: Effort normal and breath sounds normal.  Abdominal: Soft. Bowel sounds are normal.  Musculoskeletal: Normal range of motion.  Neurological: She is alert and oriented to person, place, and time.  Skin: Skin is warm and dry. Capillary refill takes less than 2 seconds.  Psychiatric: She has a normal mood and affect. Her behavior is normal. Judgment and thought content normal.  Nursing note and vitals reviewed.  Assessment & Plan:   1. Prediabetes Much improved. Hgb A1c at 6.3 on 05/28/2018, from 12.5 on 11/02/2015.  She will continue to decrease foods/beverages high in sugars and carbs and follow Heart Healthy or DASH diet. Increase physical activity to at least 30 minutes cardio exercise daily.   2. Obesity (BMI 30.0-34.9) Improved. BMI is 32.42 today, from 34.77 on 05/15/2017. Goal BMI is  <25. Encouraged efforts to reduce weight include engaging in physical activity as tolerated with goal of 150 minutes per week. Improve dietary choices and eat a meal regimen consistent with a Mediterranean or DASH diet. Reduce simple carbohydrates. Do not skip meals and eat healthy snacks throughout the day to avoid over-eating at dinner. Set a goal weight loss that is achievable for you.  3. Urinary tract infection without hematuria, site unspecified -  sulfamethoxazole-trimethoprim (BACTRIM DS,SEPTRA DS) 800-160 MG tablet; Take 1 tablet by mouth 2 (two) times daily.  Dispense: 14 tablet; Refill: 0  4. Follow up She will follow up in 6 months.  - POCT urinalysis dipstick - POCT urine pregnancy - sulfamethoxazole-trimethoprim (BACTRIM DS,SEPTRA DS) 800-160 MG tablet; Take 1 tablet by mouth 2 (two) times daily.  Dispense: 14 tablet; Refill: 0  Meds ordered this encounter  Medications  . sulfamethoxazole-trimethoprim (BACTRIM DS,SEPTRA DS) 800-160 MG tablet    Sig: Take 1 tablet by mouth 2 (two) times daily.    Dispense:  14 tablet    Refill:  0  Kathe Becton,  MSN, FNP-C [facility] [address] [place], [place] [address] [phone]

## 2018-07-02 NOTE — Patient Instructions (Signed)
Sulfamethoxazole; Trimethoprim, SMX-TMP oral suspension What is this medicine? SULFAMETHOXAZOLE; TRIMETHOPRIM or SMX-TMP (suhl fuh meth OK suh zohl; trye METH oh prim) is a combination of a sulfonamide antibiotic and a second antibiotic, trimethoprim. It is used to treat or prevent certain kinds of bacterial infections.It will not work for colds, flu, or other viral infections. This medicine may be used for other purposes; ask your health care provider or pharmacist if you have questions. COMMON BRAND NAME(S): Septra, Sulfatrim, Sulfatrim Pediatric, Sultrex Pediatric What should I tell my health care provider before I take this medicine? They need to know if you have any of these conditions: -anemia -asthma -being treated with anticonvulsants -if you frequently drink alcohol containing drinks -kidney disease -liver disease -low level of folic acid or glucose-6-phosphate dehydrogenase -poor nutrition or malabsorption -porphyria -severe allergies -thyroid disorder -an unusual or allergic reaction to sulfamethoxazole, trimethoprim, sulfa drugs, other medicines, foods, dyes, or preservatives -pregnant or trying to get pregnant -breast-feeding How should I use this medicine? Take this suspension by mouth. Follow the directions on the prescription label. Shake the bottle well before taking. Use a specially marked spoon or container to measure your medicine. Ask your pharmacist if you do not have one. Household spoons are not accurate. Take your doses at regular intervals. Do not take more medicine than directed. Talk to your pediatrician regarding the use of this medicine in children. Special care may be needed. While this drug may be prescribed for children as young as 2 months of age for selected conditions, precautions do apply. Overdosage: If you think you have taken too much of this medicine contact a poison control center or emergency room at once. NOTE: This medicine is only for you. Do not  share this medicine with others. What if I miss a dose? If you miss a dose, take it as soon as you can. If it is almost time for your next dose, take only that dose. Do not take double or extra doses. What may interact with this medicine? Do not take this medicine with any of the following medications -aminobenzoate potassium -dofetilide -metronidazole This medicine may also interact with the following medications -ACE inhibitors like benazepril, enalapril, lisinopril, and ramipril -birth control pills -cyclosporine -digoxin -diuretics -indomethacin -medicines for diabetes -methenamine -methotrexate -phenytoin -potassium supplements -pyrimethamine -sulfinpyrazone -tricyclic antidepressants -warfarin This list may not describe all possible interactions. Give your health care provider a list of all the medicines, herbs, non-prescription drugs, or dietary supplements you use. Also tell them if you smoke, drink alcohol, or use illegal drugs. Some items may interact with your medicine. What should I watch for while using this medicine? Tell your doctor or health care professional if your symptoms do not improve. Drink several glasses of water a day to reduce the risk of kidney problems. Do not treat diarrhea with over the counter products. Contact your doctor if you have diarrhea that lasts more than 2 days or if it is severe and watery. This medicine can make you more sensitive to the sun. Keep out of the sun. If you cannot avoid being in the sun, wear protective clothing and use a sunscreen. Do not use sun lamps or tanning beds/booths. What side effects may I notice from receiving this medicine? Side effects that you should report to your doctor or health care professional as soon as possible: -allergic reactions like skin rash or hives, swelling of the face, lips, or tongue -breathing problems -fever or chills, sore throat -irregular heartbeat, chest pain -  joint or muscle pain -pain  or difficulty passing urine -red pinpoint spots on skin -redness, blistering, peeling or loosening of the skin, including inside the mouth -unusual bleeding or bruising -unusual weakness or tiredness -yellowing of the eyes or skin Side effects that usually do not require medical attention (report to your doctor or health care professional if they continue or are bothersome): -diarrhea -dizziness -headache -loss of appetite -nausea, vomiting -nervousness This list may not describe all possible side effects. Call your doctor for medical advice about side effects. You may report side effects to FDA at 1-800-FDA-1088. Where should I keep my medicine? Keep out of the reach of children. Store at room temperature between 15 and 25 degrees C (59 and 77 degrees F). Protect from light and moisture. Throw away any unused medicine after the expiration date. NOTE: This sheet is a summary. It may not cover all possible information. If you have questions about this medicine, talk to your doctor, pharmacist, or health care provider.  2018 Elsevier/Gold Standard (2013-06-10 14:37:40)  

## 2018-07-20 ENCOUNTER — Encounter: Payer: Self-pay | Admitting: Family Medicine

## 2018-07-20 ENCOUNTER — Ambulatory Visit (INDEPENDENT_AMBULATORY_CARE_PROVIDER_SITE_OTHER): Payer: BLUE CROSS/BLUE SHIELD | Admitting: Family Medicine

## 2018-07-20 VITALS — BP 128/90 | HR 55 | Temp 98.3°F | Ht 67.0 in | Wt 210.0 lb

## 2018-07-20 DIAGNOSIS — Z30017 Encounter for initial prescription of implantable subdermal contraceptive: Secondary | ICD-10-CM

## 2018-07-20 DIAGNOSIS — Z09 Encounter for follow-up examination after completed treatment for conditions other than malignant neoplasm: Secondary | ICD-10-CM

## 2018-07-20 DIAGNOSIS — Z30019 Encounter for initial prescription of contraceptives, unspecified: Secondary | ICD-10-CM

## 2018-07-20 LAB — POCT URINALYSIS DIP (MANUAL ENTRY)
Bilirubin, UA: NEGATIVE
Blood, UA: NEGATIVE
Glucose, UA: NEGATIVE mg/dL
Ketones, POC UA: NEGATIVE mg/dL
Nitrite, UA: NEGATIVE
Protein Ur, POC: NEGATIVE mg/dL
Spec Grav, UA: 1.02 (ref 1.010–1.025)
Urobilinogen, UA: 0.2 E.U./dL
pH, UA: 5.5 (ref 5.0–8.0)

## 2018-07-20 LAB — POCT URINE PREGNANCY: Preg Test, Ur: NEGATIVE

## 2018-07-20 NOTE — Progress Notes (Signed)
Follow Up for Neplanon Insertion  Subjective:    Patient ID: Angela Henderson, female    DOB: Dec 27, 1990, 27 y.o.   MRN: [id number]   Chief Complaint  Patient presents with  . Contraception   HPI  Angela Henderson is a 27 year old female with a past medical history of Diabetes, and Hyperlipidemia. She is here today for Neplanon Insertion.   Current Status: Since her last office visit, she is doing well with no complaints.   Gynecologic History Patient's last menstrual period was the last week 06/2018.  Obstetric History        OB History  Gravida Para Term Preterm AB Living  1 0 1 0 0 0  SAB TAB Ectopic Multiple Live Births   0 0 0 0 0     Her last menstrual period was the last week in August. Her periods usually last 5-7 days. No discharge, discomfort, irregular bleeding, abdominal pain.  She is sexually active, and currently does not use anything for birth control.    Past Medical History:  Diagnosis Date  . DKA, type 2 (HCC) 1st admission 11/02/2015  . Hyperlipidemia   . Type II diabetes mellitus (HCC) dx'd 09/2015   mother and grandmother are type 1, dad type 2    Family History  Problem Relation Age of Onset  . Diabetes Mother        Type I  . Hypertension Mother   . Diabetes Father   . Hypertension Father   . Diabetes Cousin        Type I  . Cancer Maternal Aunt     Social History   Socioeconomic History  . Marital status: Single    Spouse name: Not on file  . Number of children: Not on file  . Years of education: Not on file  . Highest education level: Not on file  Occupational History  . Not on file  Social Needs  . Financial resource strain: Not on file  . Food insecurity:    Worry: Not on file    Inability: Not on file  . Transportation needs:    Medical: Not on file    Non-medical: Not on file  Tobacco Use  . Smoking status: Never Smoker  . Smokeless tobacco: Never Used  Substance and Sexual Activity  . Alcohol use: No    Alcohol/week:  0.0 standard drinks    Comment: 11/02/2015 "1-2 drinks on weekend maybe once/month"  . Drug use: No  . Sexual activity: Yes  Lifestyle  . Physical activity:    Days per week: Not on file    Minutes per session: Not on file  . Stress: Not on file  Relationships  . Social connections:    Talks on phone: Not on file    Gets together: Not on file    Attends religious service: Not on file    Active member of club or organization: Not on file    Attends meetings of clubs or organizations: Not on file    Relationship status: Not on file  . Intimate partner violence:    Fear of current or ex partner: Not on file    Emotionally abused: Not on file    Physically abused: Not on file    Forced sexual activity: Not on file  Other Topics Concern  . Not on file  Social History Narrative  . Not on file    Past Surgical History:  Procedure Laterality Date  . WISDOM TOOTH EXTRACTION  Immunization History  Administered Date(s) Administered  . Influenza Whole 11/01/2007, 08/24/2008  . Influenza-Unspecified 09/18/2015  . Pneumococcal Polysaccharide-23 11/03/2015  . Td 07/03/2010  . Tdap 02/20/2017    No outpatient medications have been marked as taking for the 07/20/18 encounter (Office Visit) with Kallie Locks, FNP.    No Known Allergies  BP 128/90 (BP Location: Right Arm, Patient Position: Sitting, Cuff Size: Large)   Pulse (!) 55   Temp 98.3 F (36.8 C) (Oral)   Ht 5\' 7"  (1.702 m)   Wt 210 lb (95.3 kg)   LMP 07/07/2018   SpO2 100%   BMI 32.89 kg/m    Review of Systems  HENT: Negative.   Cardiovascular: Negative.   Gastrointestinal: Negative.   Genitourinary: Negative.   Musculoskeletal: Negative.   Skin: Negative.   Neurological: Negative.   Psychiatric/Behavioral: Negative.    Objective:   Physical Exam  Constitutional: She is oriented to person, place, and time.  Cardiovascular: Normal rate, regular rhythm, normal heart sounds and intact distal pulses.    Pulmonary/Chest: Effort normal and breath sounds normal.  Abdominal: Soft. Bowel sounds are normal.  Musculoskeletal: Normal range of motion.  Neurological: She is alert and oriented to person, place, and time.  Skin: Skin is warm. Capillary refill takes less than 2 seconds.     Psychiatric: She has a normal mood and affect. Her behavior is normal. Judgment and thought content normal.   Assessment & Plan:   1. Encounter for female birth control Urine pregnancy test is negative today. - POCT urinalysis dipstick - POCT urine pregnancy  2. Nexplanon insertion Tolerated Nexplanon Insertion well with no complaints.   3. Encounter for initial prescription of Nexplanon  4. Follow up She will keep follow up appointment 12/2017.  No orders of the defined types were placed in this encounter.  Raliegh Ip,  MSN, FNP-C Patient [facility] [address] [place], [place] [address] [phone]

## 2018-07-21 ENCOUNTER — Ambulatory Visit: Payer: BLUE CROSS/BLUE SHIELD | Admitting: Family Medicine

## 2018-08-13 ENCOUNTER — Encounter: Payer: Self-pay | Admitting: Family Medicine

## 2018-08-13 ENCOUNTER — Ambulatory Visit (INDEPENDENT_AMBULATORY_CARE_PROVIDER_SITE_OTHER): Payer: BLUE CROSS/BLUE SHIELD | Admitting: Family Medicine

## 2018-08-13 ENCOUNTER — Other Ambulatory Visit: Payer: Self-pay

## 2018-08-13 VITALS — BP 139/91 | HR 97 | Temp 98.0°F | Wt 200.0 lb

## 2018-08-13 DIAGNOSIS — J0100 Acute maxillary sinusitis, unspecified: Secondary | ICD-10-CM | POA: Diagnosis not present

## 2018-08-13 DIAGNOSIS — N926 Irregular menstruation, unspecified: Secondary | ICD-10-CM | POA: Diagnosis not present

## 2018-08-13 DIAGNOSIS — Z09 Encounter for follow-up examination after completed treatment for conditions other than malignant neoplasm: Secondary | ICD-10-CM | POA: Diagnosis not present

## 2018-08-13 LAB — POCT URINE PREGNANCY: Preg Test, Ur: NEGATIVE

## 2018-08-13 MED ORDER — AMOXICILLIN-POT CLAVULANATE 875-125 MG PO TABS: 1.0000 | ORAL_TABLET | Freq: Two times a day (BID) | ORAL | 0 refills | Status: AC

## 2018-08-13 NOTE — Progress Notes (Signed)
Sick Visit  Subjective:    Patient ID: Angela Henderson, female    DOB: January 18, 1991, 27 y.o.   MRN: [id number]   Chief Complaint  Patient presents with  . URI    nasal congestion, chest congestion, sore throat for 2 weeks  . Amenorrhea    no menses since Nexplanon insertion   HPI  Angela Henderson is a 27 female with a past medical history of Diabetes and Hyperlipidemia. She is here today for a sick visit.   Current Status: Since her last office visit, she has c/o sinus congestion and cough for 2 weeks. She has tried OTC medications with no relief. She denies fevers, chills, fatigue, recent infections, weight loss, and night sweats. She has not had any headaches, visual changes, dizziness, and falls. No chest pain, heart palpitations, cough and shortness of breath reported. No reports of GI problems such as nausea, vomiting, diarrhea, and constipation. She has no reports of blood in stools, dysuria and hematuria. No depression or anxiety reported.   Review of Systems  HENT: Positive for congestion (sinus congestion.), sinus pressure and sinus pain.   Respiratory:       Sinus pain and congestion.  Cardiovascular: Negative.   Gastrointestinal: Negative.   Genitourinary: Negative.   Musculoskeletal: Negative.   Neurological: Negative.   Psychiatric/Behavioral: Negative.    Objective:   Physical Exam  Constitutional: She is oriented to person, place, and time.  Cardiovascular: Normal rate, regular rhythm, normal heart sounds and intact distal pulses.  Pulmonary/Chest: Effort normal and breath sounds normal.  Abdominal: Soft. Bowel sounds are normal.  Musculoskeletal: Normal range of motion.  Neurological: She is alert and oriented to person, place, and time.  Skin: Skin is warm and dry.  Psychiatric: She has a normal mood and affect. Her behavior is normal. Judgment and thought content normal.   Assessment & Plan:   1. Acute non-recurrent maxillary sinusitis Patient is advised to  get plenty of rest and increase fluids. Education added to AVS for patient to review.  - amoxicillin-clavulanate (AUGMENTIN) 875-125 MG tablet; Take 1 tablet by mouth 2 (two) times daily for 7 days.  Dispense: 14 tablet; Refill: 0  2. Missed periods Pregnancy test is negative today. She recently had Nexplanon inserted on 07/20/2018. - POCT urine pregnancy  3. Follow up She will keep follow up appointment 12/2017.   Meds ordered this encounter  Medications  . amoxicillin-clavulanate (AUGMENTIN) 875-125 MG tablet    Sig: Take 1 tablet by mouth 2 (two) times daily for 7 days.    Dispense:  14 tablet    Refill:  0    Raliegh Ip,  MSN, FNP-C Patient [facility] [address] [place], [place] [address] [phone]

## 2018-08-13 NOTE — Patient Instructions (Signed)
Amoxicillin; Clavulanic Acid tablets What is this medicine? AMOXICILLIN; CLAVULANIC ACID (a mox i SIL in; KLAV yoo lan ic AS id) is a penicillin antibiotic. It is used to treat certain kinds of bacterial infections. It will not work for colds, flu, or other viral infections. This medicine may be used for other purposes; ask your health care provider or pharmacist if you have questions. COMMON BRAND NAME(S): Augmentin What should I tell my health care provider before I take this medicine? They need to know if you have any of these conditions: -bowel disease, like colitis -kidney disease -liver disease -mononucleosis -an unusual or allergic reaction to amoxicillin, penicillin, cephalosporin, other antibiotics, clavulanic acid, other medicines, foods, dyes, or preservatives -pregnant or trying to get pregnant -breast-feeding How should I use this medicine? Take this medicine by mouth with a full glass of water. Follow the directions on the prescription label. Take at the start of a meal. Do not crush or chew. If the tablet has a score line, you may cut it in half at the score line for easier swallowing. Take your medicine at regular intervals. Do not take your medicine more often than directed. Take all of your medicine as directed even if you think you are better. Do not skip doses or stop your medicine early. Talk to your pediatrician regarding the use of this medicine in children. Special care may be needed. Overdosage: If you think you have taken too much of this medicine contact a poison control center or emergency room at once. NOTE: This medicine is only for you. Do not share this medicine with others. What if I miss a dose? If you miss a dose, take it as soon as you can. If it is almost time for your next dose, take only that dose. Do not take double or extra doses. What may interact with this medicine? -allopurinol -anticoagulants -birth control pills -methotrexate -probenecid This  list may not describe all possible interactions. Give your health care provider a list of all the medicines, herbs, non-prescription drugs, or dietary supplements you use. Also tell them if you smoke, drink alcohol, or use illegal drugs. Some items may interact with your medicine. What should I watch for while using this medicine? Tell your doctor or health care professional if your symptoms do not improve. Do not treat diarrhea with over the counter products. Contact your doctor if you have diarrhea that lasts more than 2 days or if it is severe and watery. If you have diabetes, you may get a false-positive result for sugar in your urine. Check with your doctor or health care professional. Birth control pills may not work properly while you are taking this medicine. Talk to your doctor about using an extra method of birth control. What side effects may I notice from receiving this medicine? Side effects that you should report to your doctor or health care professional as soon as possible: -allergic reactions like skin rash, itching or hives, swelling of the face, lips, or tongue -breathing problems -dark urine -fever or chills, sore throat -redness, blistering, peeling or loosening of the skin, including inside the mouth -seizures -trouble passing urine or change in the amount of urine -unusual bleeding, bruising -unusually weak or tired -white patches or sores in the mouth or throat Side effects that usually do not require medical attention (report to your doctor or health care professional if they continue or are bothersome): -diarrhea -dizziness -headache -nausea, vomiting -stomach upset -vaginal or anal irritation This list may   not describe all possible side effects. Call your doctor for medical advice about side effects. You may report side effects to FDA at 1-800-FDA-1088. Where should I keep my medicine? Keep out of the reach of children. Store at room temperature below 25 degrees  C (77 degrees F). Keep container tightly closed. Throw away any unused medicine after the expiration date. NOTE: This sheet is a summary. It may not cover all possible information. If you have questions about this medicine, talk to your doctor, pharmacist, or health care provider.  2018 Elsevier/Gold Standard (2008-01-27 12:04:30)  Sinusitis, Adult Sinusitis is soreness and inflammation of your sinuses. Sinuses are hollow spaces in the bones around your face. They are located:  Around your eyes.  In the middle of your forehead.  Behind your nose.  In your cheekbones.  Your sinuses and nasal passages are lined with a stringy fluid (mucus). Mucus normally drains out of your sinuses. When your nasal tissues get inflamed or swollen, the mucus can get trapped or blocked so air cannot flow through your sinuses. This lets bacteria, viruses, and funguses grow, and that leads to infection. Follow these instructions at home: Medicines  Take, use, or apply over-the-counter and prescription medicines only as told by your doctor. These may include nasal sprays.  If you were prescribed an antibiotic medicine, take it as told by your doctor. Do not stop taking the antibiotic even if you start to feel better. Hydrate and Humidify  Drink enough water to keep your pee (urine) clear or pale yellow.  Use a cool mist humidifier to keep the humidity level in your home above 50%.  Breathe in steam for 10-15 minutes, 3-4 times a day or as told by your doctor. You can do this in the bathroom while a hot shower is running.  Try not to spend time in cool or dry air. Rest  Rest as much as possible.  Sleep with your head raised (elevated).  Make sure to get enough sleep each night. General instructions  Put a warm, moist washcloth on your face 3-4 times a day or as told by your doctor. This will help with discomfort.  Wash your hands often with soap and water. If there is no soap and water, use hand  sanitizer.  Do not smoke. Avoid being around people who are smoking (secondhand smoke).  Keep all follow-up visits as told by your doctor. This is important. Contact a doctor if:  You have a fever.  Your symptoms get worse.  Your symptoms do not get better within 10 days. Get help right away if:  You have a very bad headache.  You cannot stop throwing up (vomiting).  You have pain or swelling around your face or eyes.  You have trouble seeing.  You feel confused.  Your neck is stiff.  You have trouble breathing. This information is not intended to replace advice given to you by your health care provider. Make sure you discuss any questions you have with your health care provider. Document Released: 04/21/2008 Document Revised: 06/29/2016 Document Reviewed: 08/29/2015 Elsevier Interactive Patient Education  2018 Elsevier Inc.  

## 2018-09-23 ENCOUNTER — Telehealth: Payer: Self-pay

## 2018-09-23 NOTE — Telephone Encounter (Signed)
Left a vm of appointment on 09/27/2018 at 920am

## 2018-09-26 ENCOUNTER — Emergency Department (HOSPITAL_COMMUNITY)
Admission: EM | Admit: 2018-09-26 | Discharge: 2018-09-26 | Disposition: A | Discharge status: Home / Self Care | Payer: BLUE CROSS/BLUE SHIELD | Attending: Emergency Medicine | Admitting: Emergency Medicine

## 2018-09-26 ENCOUNTER — Other Ambulatory Visit: Payer: Self-pay

## 2018-09-26 ENCOUNTER — Encounter (HOSPITAL_COMMUNITY): Payer: Self-pay

## 2018-09-26 DIAGNOSIS — R739 Hyperglycemia, unspecified: Secondary | ICD-10-CM

## 2018-09-26 DIAGNOSIS — Z794 Long term (current) use of insulin: Secondary | ICD-10-CM | POA: Insufficient documentation

## 2018-09-26 DIAGNOSIS — R358 Other polyuria: Secondary | ICD-10-CM | POA: Diagnosis present

## 2018-09-26 DIAGNOSIS — E1165 Type 2 diabetes mellitus with hyperglycemia: Secondary | ICD-10-CM | POA: Diagnosis not present

## 2018-09-26 LAB — CBC
HCT: 33.9 % — ABNORMAL LOW (ref 36.0–46.0)
HEMOGLOBIN: 9.8 g/dL — AB (ref 12.0–15.0)
MCH: 23.6 pg — ABNORMAL LOW (ref 26.0–34.0)
MCHC: 28.9 g/dL — ABNORMAL LOW (ref 30.0–36.0)
MCV: 81.5 fL (ref 80.0–100.0)
NRBC: 0 % (ref 0.0–0.2)
PLATELETS: 328 10*3/uL (ref 150–400)
RBC: 4.16 MIL/uL (ref 3.87–5.11)
RDW: 16 % — ABNORMAL HIGH (ref 11.5–15.5)
WBC: 6.3 10*3/uL (ref 4.0–10.5)

## 2018-09-26 LAB — URINALYSIS, ROUTINE W REFLEX MICROSCOPIC
BILIRUBIN URINE: NEGATIVE
KETONES UR: NEGATIVE mg/dL
Nitrite: NEGATIVE
PH: 5 (ref 5.0–8.0)
Protein, ur: NEGATIVE mg/dL
Specific Gravity, Urine: 1.024 (ref 1.005–1.030)

## 2018-09-26 LAB — BASIC METABOLIC PANEL
ANION GAP: 6 (ref 5–15)
BUN: 8 mg/dL (ref 6–20)
CALCIUM: 9 mg/dL (ref 8.9–10.3)
CO2: 24 mmol/L (ref 22–32)
Chloride: 104 mmol/L (ref 98–111)
Creatinine, Ser: 0.8 mg/dL (ref 0.44–1.00)
Glucose, Bld: 344 mg/dL — ABNORMAL HIGH (ref 70–99)
POTASSIUM: 4 mmol/L (ref 3.5–5.1)
Sodium: 134 mmol/L — ABNORMAL LOW (ref 135–145)

## 2018-09-26 LAB — I-STAT BETA HCG BLOOD, ED (MC, WL, AP ONLY): I-stat hCG, quantitative: <5 m[IU]/mL (ref <5)

## 2018-09-26 LAB — CBG MONITORING, ED
GLUCOSE-CAPILLARY: 336 mg/dL — AB (ref 70–99)
Glucose-Capillary: 300 mg/dL — ABNORMAL HIGH (ref 70–99)
Glucose-Capillary: 276 mg/dL — ABNORMAL HIGH (ref 70–99)

## 2018-09-26 MED ORDER — SODIUM CHLORIDE 0.9 % IV BOLUS
500.0000 mL | Freq: Once | INTRAVENOUS | Status: AC
  Administered 2018-09-26: 500 mL via INTRAVENOUS

## 2018-09-26 MED ORDER — SODIUM CHLORIDE 0.9 % IV BOLUS: 500.0000 mL | Freq: Once | INTRAVENOUS | Status: DC

## 2018-09-26 MED ORDER — INSULIN ASPART 100 UNIT/ML ~~LOC~~ SOLN
6.0000 [IU] | Freq: Once | SUBCUTANEOUS | Status: AC
  Administered 2018-09-26: 6 [IU] via SUBCUTANEOUS
  Filled 2018-09-26: qty 1

## 2018-09-26 NOTE — ED Notes (Signed)
Urine culture sent with sample. 

## 2018-09-26 NOTE — ED Provider Notes (Signed)
[facility]-EMERGENCY DEPT Provider Note   CSN: [id number] Arrival date & time: 09/26/18  1232     History   Chief Complaint Chief Complaint  Patient presents with  . Hyperglycemia    HPI Angela Henderson is a 27 y.o. female with a hx of T2DM- not currently on medications who presents to the ED with complaints of polyuria/polydipsia for the past 3 weeks. Patient states sxs are progressively worsening without specific alleviating/aggravating factors. No interventions tried other than attempting to maintain good hydration. Went to UC, CBG elevated- sent to the ER for further evaluation.  Her current sxs feel similar to previous diabetes/hyperglycemia concerns. Denies nausea, vomiting, diarrhea, chest pain, abdomina pain, fevers, or dysuria. She does have a PCP she is established with.   She states she has a hx of T2DM, she was initially placed on Metformin which was subsequently discontinued and she was placed on insulin. Her insulin regimen included Novolog 25 units TID with meals and Lantus 20 units at bedtime. She relays that she felt she did not need the insulin and therefore made the decision to discontinue this without consulting a medical provider about 1 year ago. She relays that she has a new PCP- she had her A1C checked within the past 3 months and it was in the 6s- she states that she was not started on any medications at that time. She reports she has an appointment scheduled with her primary care provider tomorrow, but she states she does not think she can go because of work which prompted her UC and subsequent ER visit. She states if a note is provided for work she can go to her appointment tomorrow. There was no acute change in her sxs today to prompt visit it was the expected inability to see her PCP.   HPI  Past Medical History:  Diagnosis Date  . DKA, type 2 (HCC) 1st admission 11/02/2015  . Hyperlipidemia   . Type II diabetes mellitus (HCC) dx'd  09/2015   mother and grandmother are type 1, dad type 2    Patient Active Problem List   Diagnosis Date Noted  . Normal vaginal delivery 05/16/2017  . Normal labor 05/15/2017  . Type II diabetes mellitus (HCC) 12/16/2016  . Morbid obesity (HCC) 11/02/2015  . AKI (acute kidney injury) (HCC) 11/02/2015  . Hyperglycemia   . ANEMIA 08/06/2010  . SHORTNESS OF BREATH 08/06/2010  . FREQUENCY, URINARY 08/06/2010  . FATIGUE 05/30/2008  . ANEMIA-IRON DEFICIENCY 11/04/2007    Past Surgical History:  Procedure Laterality Date  . WISDOM TOOTH EXTRACTION       OB History    Gravida  1   Para  1   Term  1   Preterm      AB      Living  1     SAB      TAB      Ectopic      Multiple  0   Live Births  1            Home Medications    Prior to Admission medications   Medication Sig Start Date End Date Taking? Authorizing Provider  insulin aspart (NOVOLOG) 100 UNIT/ML FlexPen Inject 25 Units into the skin 3 (three) times daily with meals.     [provider]  insulin glargine (LANTUS) 100 UNIT/ML injection Inject 20 Units into the skin at bedtime.    [provider]    Family History Family History  Problem Relation Age of Onset  . Diabetes Mother        Type I  . Hypertension Mother   . Diabetes Father   . Hypertension Father   . Diabetes Cousin        Type I  . Cancer Maternal Aunt     Social History Social History   Tobacco Use  . Smoking status: Never Smoker  . Smokeless tobacco: Never Used  Substance Use Topics  . Alcohol use: No    Alcohol/week: 0.0 standard drinks    Comment: 11/02/2015 "1-2 drinks on weekend maybe once/month"  . Drug use: No     Allergies   Patient has no known allergies.   Review of Systems Review of Systems  Constitutional: Negative for chills and fever.  HENT: Negative for congestion, ear pain, sore throat and voice change.   Respiratory: Negative for cough and shortness of breath.     Cardiovascular: Negative for chest pain.  Gastrointestinal: Negative for abdominal pain, blood in stool, constipation, nausea and vomiting.  Endocrine: Positive for polydipsia, polyphagia and polyuria.  Genitourinary: Negative for dysuria, vaginal bleeding and vaginal discharge.  All other systems reviewed and are negative.    Physical Exam Updated Vital Signs BP (!) 130/94   Pulse 66   Temp 98.8 F (37.1 C) (Oral)   Resp 18   Ht 5\' 6"  (1.676 m)   Wt 89.8 kg   SpO2 100%   BMI 31.96 kg/m   Physical Exam  Constitutional: She appears well-developed and well-nourished.  Non-toxic appearance. No distress.  HENT:  Head: Normocephalic and atraumatic.  Eyes: Conjunctivae are normal. Right eye exhibits no discharge. Left eye exhibits no discharge.  Neck: Neck supple.  Cardiovascular: Normal rate and regular rhythm.  Pulmonary/Chest: Effort normal and breath sounds normal. No respiratory distress. She has no wheezes. She has no rhonchi. She has no rales.  Respiration even and unlabored  Abdominal: Soft. She exhibits no distension. There is no tenderness.  Neurological: She is alert.  Clear speech.   Skin: Skin is warm and dry. No rash noted.  Psychiatric: She has a normal mood and affect. Her behavior is normal.  Nursing note and vitals reviewed.  ED Treatments / Results  Labs (all labs ordered are listed, but only abnormal results are displayed) Labs Reviewed  BASIC METABOLIC PANEL - Abnormal; Notable for the following components:      Result Value   Sodium 134 (*)    Glucose, Bld 344 (*)    All other components within normal limits  CBC - Abnormal; Notable for the following components:   Hemoglobin 9.8 (*)    HCT 33.9 (*)    MCH 23.6 (*)    MCHC 28.9 (*)    RDW 16.0 (*)    All other components within normal limits  URINALYSIS, ROUTINE W REFLEX MICROSCOPIC - Abnormal; Notable for the following components:   Color, Urine STRAW (*)    Glucose, UA >=500 (*)    Hgb urine  dipstick MODERATE (*)    Leukocytes, UA TRACE (*)    Bacteria, UA RARE (*)    All other components within normal limits  CBG MONITORING, ED - Abnormal; Notable for the following components:   Glucose-Capillary 336 (*)    All other components within normal limits  CBG MONITORING, ED  I-STAT BETA HCG BLOOD, ED ([facility], [facility], [facility] ONLY)    EKG None  Radiology No results found.  Procedures Procedures (including critical care time)  Medications  Ordered in ED Medications  insulin aspart (novoLOG) injection 6 Units (has no administration in time range)  sodium chloride 0.9 % bolus 500 mL (has no administration in time range)     Initial Impression / Assessment and Plan / ED Course  I have reviewed the triage vital signs and the nursing notes.  Pertinent labs & imaging results that were available during my care of the patient were reviewed by me and considered in my medical decision making (see chart for details).   Patient with history of type 2 diabetes not currently on medications presents to the emergency department with polyuria/polydipsia and hyperglycemia.  Patient discontinued her insulin without consulting with her primary care provider approximately 1 year prior.  She had recent A1c which she reports was well controlled.  Her symptoms have been ongoing for about 3 weeks, she is in the emergency department today because she is concerned she cannot go to her PCP appointment tomorrow due to needing to work.  Her work up in the ER is notable for hyperglycemia to 344 w/ glucosuria. No acidosis, anion gap elevation, or ketonuria to suggest DKA. Hgb consistent with baseline. No leukocytosis. UA without evidence of a UTI- no dysuria, hematuria will need PCP recheck.  Patient without emergent indication for admission at this time. She likely needs to be restarted on a diabetic medication regimen. Will administer 6 units of short acting insulin with fluid bolus and repeat CBG with plan for  discharge home. A work note will be provided to ensure patient is able to attend her PCP appointment tomorrow to be able to discuss further and long term management of her diabetes. I stressed the importance of this with the patient.    15:45: Patient signed out to [employer] pending receiving of insulin, completion of fluid bolus, and repeat CBG. Plan for discharge home as mentioned above. Work note completed.   Findings and plan of care discussed with supervising physician Dr. Lynelle Doctor who is in agreement with plan.   Final Clinical Impressions(s) / ED Diagnoses   Final diagnoses:  Hyperglycemia    ED Discharge Orders    None       Cherly Anderson, PA-C 09/27/18 1056    Linwood Dibbles, MD 09/29/18 1108

## 2018-09-26 NOTE — Discharge Instructions (Addendum)
You were seen in the emergency department for symptoms likely related to your diabetes. You blood sugar was elevated to as high as 344 initially. There is sugar in your urine. You were given insulin and fluids to help with this in the ER .   Your additional labs showed anemia that is similar to previous. There is some blood in your urine that will also need primary care recheck.    We have provided you with a work note to be able to miss work to attend your primary care appointment tomorrow. It is essential that you attend this appointment to further manage your diabetes. Please attend this appointment for follow up. Return to the ER for new or worsening symptoms or any other concerns.

## 2018-09-26 NOTE — ED Triage Notes (Signed)
Pt states that she went to [facility], who told her to come to the ED for hyperglycemia. Her sugar was 401 there. Pt states she has been having urinary frequency and dry mouth.

## 2018-09-26 NOTE — ED Provider Notes (Signed)
27 year old female received signout from PA Petrucelli pending repeat CBG.  Per her HPI:  "Angela Henderson is a 27 y.o. female with a hx of T2DM- not currently on medications who presents to the ED with complaints of polyuria/polydipsia for the past 3 weeks. Patient states sxs are progressively worsening without specific alleviating/aggravating factors. No interventions tried other than attempting to maintain good hydration. Went to UC, CBG elevated- sent to the ER for further evaluation.  Her current sxs feel similar to previous diabetes/hyperglycemia concerns. Denies nausea, vomiting, diarrhea, chest pain, abdomina pain, fevers, or dysuria. She does have a PCP she is established with.   She states she has a hx of T2DM, she was initially placed on Metformin which was subsequently discontinued and she was placed on insulin. Her insulin regimen included Novolog 25 units TID with meals and Lantus 20 units at bedtime. She relays that she felt she did not need the insulin and therefore made the decision to discontinue this without consulting a medical provider about 1 year ago. She relays that she has a new PCP- she had her A1C checked within the past 3 months and it was in the 6s- she states that she was not started on any medications at that time. She reports she has an appointment scheduled with her primary care provider tomorrow, but she states she does not think she can go because of work which prompted her UC and subsequent ER visit. She states if a note is provided for work she can go to her appointment tomorrow. There was no acute change in her sxs today to prompt visit it was the expected inability to see her PCP."   Physical Exam  BP 122/77   Pulse 72   Temp 98.8 F (37.1 C) (Oral)   Resp 18   Ht 5\' 6"  (1.676 m)   Wt 89.8 kg   SpO2 100%   BMI 31.96 kg/m   Physical Exam  Well appearing. No acute distress.   ED Course/Procedures     Procedures  MDM  27 year old female with  history of type 2 diabetes mellitus presenting with hyperglycemia.  She is not currently on any home medications.  She has an appointment tomorrow with endocrinology.  The patient was given fluids and insulin in the ED.  Labs are reassuring that the patient is not in HHS or DKA.  Repeat CBG is 276, down from 344 on arrival.  At this time, the patient is appropriate for discharge with outpatient follow-up.  Strict return precautions given.  She is hematin medically stable and in no acute distress.  She is safe for discharge home with outpatient follow-up at this time.       Barkley Boards, PA-C 09/26/18 1946    Lorre Nick, MD 09/27/18 [phone]

## 2018-09-27 ENCOUNTER — Telehealth: Payer: Self-pay

## 2018-09-27 ENCOUNTER — Ambulatory Visit (INDEPENDENT_AMBULATORY_CARE_PROVIDER_SITE_OTHER): Payer: BLUE CROSS/BLUE SHIELD | Admitting: Family Medicine

## 2018-09-27 VITALS — BP 126/88 | HR 72 | Temp 98.2°F | Ht 66.0 in | Wt 201.0 lb

## 2018-09-27 DIAGNOSIS — Z09 Encounter for follow-up examination after completed treatment for conditions other than malignant neoplasm: Secondary | ICD-10-CM | POA: Diagnosis not present

## 2018-09-27 DIAGNOSIS — E119 Type 2 diabetes mellitus without complications: Secondary | ICD-10-CM | POA: Diagnosis not present

## 2018-09-27 DIAGNOSIS — R8290 Unspecified abnormal findings in urine: Secondary | ICD-10-CM | POA: Diagnosis not present

## 2018-09-27 DIAGNOSIS — R7309 Other abnormal glucose: Secondary | ICD-10-CM

## 2018-09-27 DIAGNOSIS — B379 Candidiasis, unspecified: Secondary | ICD-10-CM

## 2018-09-27 DIAGNOSIS — R300 Dysuria: Secondary | ICD-10-CM

## 2018-09-27 LAB — POCT GLYCOSYLATED HEMOGLOBIN (HGB A1C): Hemoglobin A1C: 10.3 % — AB (ref 4.0–5.6)

## 2018-09-27 LAB — POCT URINALYSIS DIP (MANUAL ENTRY)
Bilirubin, UA: NEGATIVE
Glucose, UA: 500 mg/dL — AB
Ketones, POC UA: NEGATIVE mg/dL
Leukocytes, UA: NEGATIVE
Nitrite, UA: NEGATIVE
Protein Ur, POC: NEGATIVE mg/dL
Spec Grav, UA: 1.01 (ref 1.010–1.025)
Urobilinogen, UA: 0.2 E.U./dL
pH, UA: 6 (ref 5.0–8.0)

## 2018-09-27 LAB — GLUCOSE, POCT (MANUAL RESULT ENTRY)
POC Glucose: 306 mg/dl — AB (ref 70–99)
POC Glucose: 341 mg/dl — AB (ref 70–99)

## 2018-09-27 MED ORDER — INSULIN LISPRO 100 UNIT/ML ~~LOC~~ SOLN
10.0000 [IU] | Freq: Once | SUBCUTANEOUS | Status: AC
  Administered 2018-09-27: 10 [IU] via SUBCUTANEOUS

## 2018-09-27 MED ORDER — GLUCOSE BLOOD VI STRP: ORAL_STRIP | 12 refills | Status: AC

## 2018-09-27 MED ORDER — METFORMIN HCL 500 MG PO TABS: 500.0000 mg | ORAL_TABLET | Freq: Two times a day (BID) | ORAL | 2 refills | Status: DC

## 2018-09-27 MED ORDER — INSULIN GLARGINE 100 UNIT/ML SOLOSTAR PEN: 15.0000 [IU] | PEN_INJECTOR | Freq: Every day | SUBCUTANEOUS | 3 refills | Status: DC

## 2018-09-27 MED ORDER — BLOOD GLUCOSE METER KIT: PACK | 0 refills | Status: DC

## 2018-09-27 MED ORDER — INSULIN DETEMIR 100 UNIT/ML ~~LOC~~ SOLN: 15.0000 [IU] | Freq: Every day | SUBCUTANEOUS | 11 refills | Status: DC

## 2018-09-27 MED ORDER — INSULIN ASPART 100 UNIT/ML FLEXPEN: 10.0000 [IU] | PEN_INJECTOR | Freq: Three times a day (TID) | SUBCUTANEOUS | 3 refills | Status: DC

## 2018-09-27 MED ORDER — FLUCONAZOLE 150 MG PO TABS: 150.0000 mg | ORAL_TABLET | Freq: Once | ORAL | 0 refills | Status: AC

## 2018-09-27 NOTE — Telephone Encounter (Signed)
Patient notified

## 2018-09-27 NOTE — Patient Instructions (Addendum)
Metformin tablets What is this medicine? METFORMIN (met FOR min) is used to treat type 2 diabetes. It helps to control blood sugar. Treatment is combined with diet and exercise. This medicine can be used alone or with other medicines for diabetes. This medicine may be used for other purposes; ask your health care provider or pharmacist if you have questions. COMMON BRAND NAME(S): Glucophage What should I tell my health care provider before I take this medicine? They need to know if you have any of these conditions: -anemia -dehydration -heart disease -frequently drink alcohol-containing beverages -kidney disease -liver disease -polycystic ovary syndrome -serious infection or injury -vomiting -an unusual or allergic reaction to metformin, other medicines, foods, dyes, or preservatives -pregnant or trying to get pregnant -breast-feeding How should I use this medicine? Take this medicine by mouth with a glass of water. Follow the directions on the prescription label. Take this medicine with food. Take your medicine at regular intervals. Do not take your medicine more often than directed. Do not stop taking except on your doctor's advice. Talk to your pediatrician regarding the use of this medicine in children. While this drug may be prescribed for children as young as 54 years of age for selected conditions, precautions do apply. Overdosage: If you think you have taken too much of this medicine contact a poison control center or emergency room at once. NOTE: This medicine is only for you. Do not share this medicine with others. What if I miss a dose? If you miss a dose, take it as soon as you can. If it is almost time for your next dose, take only that dose. Do not take double or extra doses. What may interact with this medicine? Do not take this medicine with any of the following medications: -dofetilide -certain contrast medicines given before X-rays, CT scans, MRI, or other  procedures This medicine may also interact with the following medications: -acetazolamide -certain antiviral medicines for HIV or AIDS or for hepatitis, like adefovir, dolutegravir, emtricitabine, entecavir, lamivudine, paritaprevir, or tenofovir -cimetidine -cobicistat -crizotinib -dichlorphenamide -digoxin -diuretics -female hormones, like estrogens or progestins and birth control pills -glycopyrrolate -isoniazid -lamotrigine -medicines for blood pressure, heart disease, irregular heart beat -memantine -midodrine -methazolamide -morphine -niacin -phenothiazines like chlorpromazine, mesoridazine, prochlorperazine, thioridazine -phenytoin -procainamide -propantheline -quinidine -quinine -ranitidine -ranolazine -steroid medicines like prednisone or cortisone -stimulant medicines for attention disorders, weight loss, or to stay awake -thyroid medicines -topiramate -trimethoprim -trospium -vancomycin -vandetanib -zonisamide This list may not describe all possible interactions. Give your health care provider a list of all the medicines, herbs, non-prescription drugs, or dietary supplements you use. Also tell them if you smoke, drink alcohol, or use illegal drugs. Some items may interact with your medicine. What should I watch for while using this medicine? Visit your doctor or health care professional for regular checks on your progress. A test called the HbA1C (A1C) will be monitored. This is a simple blood test. It measures your blood sugar control over the last 2 to 3 months. You will receive this test every 3 to 6 months. Learn how to check your blood sugar. Learn the symptoms of low and high blood sugar and how to manage them. Always carry a quick-source of sugar with you in case you have symptoms of low blood sugar. Examples include hard sugar candy or glucose tablets. Make sure others know that you can choke if you eat or drink when you develop serious symptoms of low  blood sugar, such as seizures or unconsciousness. They  must get medical help at once. Tell your doctor or health care professional if you have high blood sugar. You might need to change the dose of your medicine. If you are sick or exercising more than usual, you might need to change the dose of your medicine. Do not skip meals. Ask your doctor or health care professional if you should avoid alcohol. Many nonprescription cough and cold products contain sugar or alcohol. These can affect blood sugar. This medicine may cause ovulation in premenopausal women who do not have regular monthly periods. This may increase your chances of becoming pregnant. You should not take this medicine if you become pregnant or think you may be pregnant. Talk with your doctor or health care professional about your birth control options while taking this medicine. Contact your doctor or health care professional right away if think you are pregnant. If you are going to need surgery, a MRI, CT scan, or other procedure, tell your doctor that you are taking this medicine. You may need to stop taking this medicine before the procedure. Wear a medical ID bracelet or chain, and carry a card that describes your disease and details of your medicine and dosage times. What side effects may I notice from receiving this medicine? Side effects that you should report to your doctor or health care professional as soon as possible: -allergic reactions like skin rash, itching or hives, swelling of the face, lips, or tongue -breathing problems -feeling faint or lightheaded, falls -muscle aches or pains -signs and symptoms of low blood sugar such as feeling anxious, confusion, dizziness, increased hunger, unusually weak or tired, sweating, shakiness, cold, irritable, headache, blurred vision, fast heartbeat, loss of consciousness -slow or irregular heartbeat -unusual stomach pain or discomfort -unusually tired or weak Side effects that  usually do not require medical attention (report to your doctor or health care professional if they continue or are bothersome): -diarrhea -headache -heartburn -metallic taste in mouth -nausea -stomach gas, upset This list may not describe all possible side effects. Call your doctor for medical advice about side effects. You may report side effects to FDA at 1-800-FDA-1088. Where should I keep my medicine? Keep out of the reach of children. Store at room temperature between 15 and 30 degrees C (59 and 86 degrees F). Protect from moisture and light. Throw away any unused medicine after the expiration date. NOTE: This sheet is a summary. It may not cover all possible information. If you have questions about this medicine, talk to your doctor, pharmacist, or health care provider.  2018 Elsevier/Gold Standard (2016-05-14 15:34:19)           Insulin Aspart injection What is this medicine? INSULIN ASPART (IN su lin AS part) is a human-made form of insulin. This drug lowers the amount of sugar in your blood. It is a fast acting insulin that starts working faster than regular insulin. It will not work as long as regular insulin. This medicine may be used for other purposes; ask your health care provider or pharmacist if you have questions. COMMON BRAND NAME(S): Fiasp, [employer], NovoLog, NovoLog Flexpen, NovoLog PenFill What should I tell my health care provider before I take this medicine? They need to know if you have any of these conditions: -episodes of low blood sugar -kidney disease -liver disease -an unusual or allergic reaction to insulin, metacresol, other medicines, foods, dyes, or preservatives -pregnant or trying to get pregnant -breast-feeding How should I use this medicine? This medicine is for injection  under the skin. Use exactly as directed. It is important to follow the directions given to you by your health care professional or doctor. If you are using Novolog,  you should start your meal within 5 to 10 minutes after injection. If you are using Fiasp, you should start your meal at the time of injection or within 20 minutes after injection. Have food ready before injection. Do not delay eating. You will be taught how to use this medicine and how to adjust doses for activities and illness. Do not use more insulin than prescribed. Do not use more or less often than prescribed. Always check the appearance of your insulin before using it. This medicine should be clear and colorless like water. Do not use if it is cloudy, thickened, colored, or has solid particles in it. It is important that you put your used needles and syringes in a special sharps container. Do not put them in a trash can. If you do not have a sharps container, call your pharmacist or healthcare provider to get one. Talk to your pediatrician regarding the use of this medicine in children. While Novolog may be prescribed for children as young as 38 years of age for selected conditions, precautions do apply. [employer] is not approved for use in children. Overdosage: If you think you have taken too much of this medicine contact a poison control center or emergency room at once. NOTE: This medicine is only for you. Do not share this medicine with others. What if I miss a dose? It is important not to miss a dose. Your health care professional or doctor should discuss a plan for missed doses with you. If you do miss a dose, follow their plan. Do not take double doses. What may interact with this medicine? -other medicines for diabetes Many medications may cause an increase or decrease in blood sugar, these include: -alcohol containing beverages -antiviral medicines for HIV or AIDS -aspirin and aspirin-like drugs -certain medicines for depression, anxiety, or psychotic disturbances -chromium -diuretics -female hormones, like estrogens or progestins and birth control pills -heart  medicines -isoniazid -MAOIs like Carbex, Eldepryl, Marplan, Nardil, and Parnate -female hormones or anabolic steroids -medicines for weight loss -medicines for allergies, asthma, cold, or cough -niacin -NSAIDs, medicines for pain and inflammation, like ibuprofen or naproxen -octreotide -pentamidine -phenytoin -probenecid -quinolone antibiotics like ciprofloxacin, levofloxacin, ofloxacin -some herbal dietary supplements -steroid medicines like prednisone or cortisone -sulfamethoxazole; trimethoprim -thyroid medicine Some medications can hide the warning symptoms of low blood sugar. You may need to monitor your blood sugar more closely if you are taking one of these medications. These include: -beta-blockers such as atenolol, metoprolol, propranolol -clonidine -guanethidine -reserpine This list may not describe all possible interactions. Give your health care provider a list of all the medicines, herbs, non-prescription drugs, or dietary supplements you use. Also tell them if you smoke, drink alcohol, or use illegal drugs. Some items may interact with your medicine. What should I watch for while using this medicine? Visit your health care professional or doctor for regular checks on your progress. A test called the HbA1C (A1C) will be monitored. This is a simple blood test. It measures your blood sugar control over the last 2 to 3 months. You will receive this test every 3 to 6 months. Learn how to check your blood sugar. Learn the symptoms of low and high blood sugar and how to manage them. Always carry a quick-source of sugar with you in case you have symptoms of  low blood sugar. Examples include hard sugar candy or glucose tablets. Make sure others know that you can choke if you eat or drink when you develop serious symptoms of low blood sugar, such as seizures or unconsciousness. They must get medical help at once. Tell your doctor or health care professional if you have high blood sugar.  You might need to change the dose of your medicine. If you are sick or exercising more than usual, you might need to change the dose of your medicine. Do not skip meals. Ask your doctor or health care professional if you should avoid alcohol. Many nonprescription cough and cold products contain sugar or alcohol. These can affect blood sugar. Make sure that you have the right kind of syringe for the type of insulin you use. Try not to change the brand and type of insulin or syringe unless your health care professional or doctor tells you to. Switching insulin brand or type can cause dangerously high or low blood sugar. Always keep an extra supply of insulin, syringes, and needles on hand. Use a syringe one time only. Throw away syringe and needle in a closed container to prevent accidental needle sticks. Insulin pens and cartridges should never be shared. Even if the needle is changed, sharing may result in passing of viruses like hepatitis or HIV. Wear a medical ID bracelet or chain, and carry a card that describes your disease and details of your medicine and dosage times. What side effects may I notice from receiving this medicine? Side effects that you should report to your doctor or health care professional as soon as possible: -allergic reactions like skin rash, itching or hives, swelling of the face, lips, or tongue -breathing problems -signs and symptoms of high blood sugar such as dizziness, dry mouth, dry skin, fruity breath, nausea, stomach pain, increased hunger or thirst, increased urination -signs and symptoms of low blood sugar such as feeling anxious, confusion, dizziness, increased hunger, unusually weak or tired, sweating, shakiness, cold, irritable, headache, blurred vision, fast heartbeat, loss of consciousness Side effects that usually do not require medical attention (report to your doctor or health care professional if they continue or are bothersome): -increase or decrease in fatty  tissue under the skin due to overuse of a particular injection site -itching, burning, swelling, or rash at site where injected This list may not describe all possible side effects. Call your doctor for medical advice about side effects. You may report side effects to FDA at 1-800-FDA-1088. Where should I keep my medicine? Keep out of the reach of children. Store unopened insulin vials in a refrigerator between 2 and 8 degrees C (36 and 46 degrees F). Do not freeze or use if the insulin has been frozen. Opened vials (vials currently in use) may be stored in the refrigerator or at room temperature, at approximately 30 degrees C (86 degrees F) or cooler. Keeping your insulin at room temperature decreases the amount of pain during injection. Once opened, your insulin can be used for 28 days. After 28 days, the vial of insulin should be thrown away. Store unopened cartridges, Novolog FlexPens,or [employer] in a refrigerator between 2 and 8 degrees C (36 and 46 degrees F.) Do not freeze or use if the insulin has been frozen. Once opened, the Novalog FlexPen and cartridges that are inserted into pens should be kept at room temperature, approximately 25 degrees C (77 degrees F) or cooler for up to 28 days; do not store in the  refrigerator. The opened [employer] can be stored at room temperature or refrigerated for up to 28 days. After 28 days, any unused insulin in any of these opened products should be thrown away. Protect from light and excessive heat. Throw away any unused medicine after the expiration date or after the specified time for room temperature storage has passed. NOTE: This sheet is a summary. It may not cover all possible information. If you have questions about this medicine, talk to your doctor, pharmacist, or health care provider.  2018 Elsevier/Gold Standard (2016-09-01 13:55:03)    Insulin Glargine injection What is this medicine? INSULIN GLARGINE (IN su lin GLAR  geen) is a human-made form of insulin. This drug lowers the amount of sugar in your blood. It is a long-acting insulin that is usually given once a day. This medicine may be used for other purposes; ask your health care provider or pharmacist if you have questions. COMMON BRAND NAME(S): BASAGLAR, Lantus, Lantus SoloStar, Toujeo SoloStar What should I tell my health care provider before I take this medicine? They need to know if you have any of these conditions: -episodes of low blood sugar -kidney disease -liver disease -an unusual or allergic reaction to insulin, metacresol, other medicines, foods, dyes, or preservatives -pregnant or trying to get pregnant -breast-feeding How should I use this medicine? This medicine is for injection under the skin. Use this medicine at the same time each day. Use exactly as directed. This insulin should never be mixed in the same syringe with other insulins before injection. Do not vigorously shake before use. You will be taught how to use this medicine and how to adjust doses for activities and illness. Do not use more insulin than prescribed. Always check the appearance of your insulin before using it. This medicine should be clear and colorless like water. Do not use it if it is cloudy, thickened, colored, or has solid particles in it. It is important that you put your used needles and syringes in a special sharps container. Do not put them in a trash can. If you do not have a sharps container, call your pharmacist or healthcare provider to get one. Talk to your pediatrician regarding the use of this medicine in children. Special care may be needed. Overdosage: If you think you have taken too much of this medicine contact a poison control center or emergency room at once. NOTE: This medicine is only for you. Do not share this medicine with others. What if I miss a dose? It is important not to miss a dose. Your health care professional or doctor should discuss  a plan for missed doses with you. If you do miss a dose, follow their plan. Do not take double doses. What may interact with this medicine? -other medicines for diabetes Many medications may cause changes in blood sugar, these include: -alcohol containing beverages -antiviral medicines for HIV or AIDS -aspirin and aspirin-like drugs -certain medicines for blood pressure, heart disease, irregular heart beat -chromium -diuretics -female hormones, such as estrogens or progestins, birth control pills -fenofibrate -gemfibrozil -isoniazid -lanreotide -female hormones or anabolic steroids -MAOIs like Carbex, Eldepryl, Marplan, Nardil, and Parnate -medicines for weight loss -medicines for allergies, asthma, cold, or cough -medicines for depression, anxiety, or psychotic disturbances -niacin -nicotine -NSAIDs, medicines for pain and inflammation, like ibuprofen or naproxen -octreotide -pasireotide -pentamidine -phenytoin -probenecid -quinolone antibiotics such as ciprofloxacin, levofloxacin, ofloxacin -some herbal dietary supplements -steroid medicines such as prednisone or cortisone -sulfamethoxazole; trimethoprim -thyroid hormones Some  medications can hide the warning symptoms of low blood sugar (hypoglycemia). You may need to monitor your blood sugar more closely if you are taking one of these medications. These include: -beta-blockers, often used for high blood pressure or heart problems (examples include atenolol, metoprolol, propranolol) -clonidine -guanethidine -reserpine This list may not describe all possible interactions. Give your health care provider a list of all the medicines, herbs, non-prescription drugs, or dietary supplements you use. Also tell them if you smoke, drink alcohol, or use illegal drugs. Some items may interact with your medicine. What should I watch for while using this medicine? Visit your health care professional or doctor for regular checks on your  progress. Do not drive, use machinery, or do anything that needs mental alertness until you know how this medicine affects you. Alcohol may interfere with the effect of this medicine. Avoid alcoholic drinks. A test called the HbA1C (A1C) will be monitored. This is a simple blood test. It measures your blood sugar control over the last 2 to 3 months. You will receive this test every 3 to 6 months. Learn how to check your blood sugar. Learn the symptoms of low and high blood sugar and how to manage them. Always carry a quick-source of sugar with you in case you have symptoms of low blood sugar. Examples include hard sugar candy or glucose tablets. Make sure others know that you can choke if you eat or drink when you develop serious symptoms of low blood sugar, such as seizures or unconsciousness. They must get medical help at once. Tell your doctor or health care professional if you have high blood sugar. You might need to change the dose of your medicine. If you are sick or exercising more than usual, you might need to change the dose of your medicine. Do not skip meals. Ask your doctor or health care professional if you should avoid alcohol. Many nonprescription cough and cold products contain sugar or alcohol. These can affect blood sugar. Make sure that you have the right kind of syringe for the type of insulin you use. Try not to change the brand and type of insulin or syringe unless your health care professional or doctor tells you to. Switching insulin brand or type can cause dangerously high or low blood sugar. Always keep an extra supply of insulin, syringes, and needles on hand. Use a syringe one time only. Throw away syringe and needle in a closed container to prevent accidental needle sticks. Insulin pens and cartridges should never be shared. Even if the needle is changed, sharing may result in passing of viruses like hepatitis or HIV. Wear a medical ID bracelet or chain, and carry a card that  describes your disease and details of your medicine and dosage times. What side effects may I notice from receiving this medicine? Side effects that you should report to your doctor or health care professional as soon as possible: -allergic reactions like skin rash, itching or hives, swelling of the face, lips, or tongue -breathing problems -signs and symptoms of high blood sugar such as dizziness, dry mouth, dry skin, fruity breath, nausea, stomach pain, increased hunger or thirst, increased urination -signs and symptoms of low blood sugar such as feeling anxious, confusion, dizziness, increased hunger, unusually weak or tired, sweating, shakiness, cold, irritable, headache, blurred vision, fast heartbeat, loss of consciousness Side effects that usually do not require medical attention (report to your doctor or health care professional if they continue or are bothersome): -increase or decrease  in fatty tissue under the skin due to overuse of a particular injection site -itching, burning, swelling, or rash at site where injected This list may not describe all possible side effects. Call your doctor for medical advice about side effects. You may report side effects to FDA at 1-800-FDA-1088. Where should I keep my medicine? Keep out of the reach of children. Store unopened vials in a refrigerator between 2 and 8 degrees C (36 and 46 degrees F). Do not freeze or use if the insulin has been frozen. Opened vials (vials currently in use) may be stored in the refrigerator or at room temperature, at approximately 25 degrees C (77 degrees F) or cooler. Keeping your insulin at room temperature decreases the amount of pain during injection. Once opened, your insulin can be used for 28 days. After 28 days, the vial should be thrown away. Store Lantus [employer] in a refrigerator between 2 and 8 degrees C (36 and 46 degrees F) or at room temperature below 30 degrees C (86 degrees F). Do not  freeze or use if the insulin has been frozen. Once opened, the pens should be kept at room temperature. Do not store in the refrigerator once opened. Once opened, the insulin can be used for 28 days. After 28 days, the Lantus Solostar Pen or Basaglar KwikPen should be thrown away. Store [employer] in a refrigerator between 2 and 8 degrees C (36 and 46 degrees F). Do not freeze or use if the insulin has been frozen. Once opened, the pens should be kept at room temperature below 30 degrees C (86 degrees F). Do not store in the refrigerator once opened. Once opened, the insulin can be used for 42 days. After 42 days, the [employer] Pen should be thrown away. Protect from light and excessive heat. Throw away any unused medicine after the expiration date or after the specified time for room temperature storage has passed. NOTE: This sheet is a summary. It may not cover all possible information. If you have questions about this medicine, talk to your doctor, pharmacist, or health care provider.  2018 Elsevier/Gold Standard (2016-11-19 10:26:25)

## 2018-09-27 NOTE — Telephone Encounter (Signed)
-----   Message from Kallie Locks, FNP sent at 09/27/2018 12:09 PM EST ----- Regarding: "New Rxs" She is to begin medications below. Rxs sent to pharmacy today.   Metformin 500 mg BID,  Lantus 15 units QHS, and  Novolog 10 units TID with meal coverage (blood glucose < 125).   Please inform patient. She is to keep follow up appointment in 1 month. We will contact her if Urine Culture is abnormal.    Thank you.

## 2018-09-27 NOTE — Progress Notes (Addendum)
Sick Visit  Subjective:    Patient ID: Angela Henderson, female    DOB: [birth year], 27 y.o.   MRN: [id number]  Chief Complaint  Patient presents with  . Dysuria  . Urinary Frequency    HPI  Angela Henderson is a 27 year old female with a past medical history of Diabetes and Hyperlipidemia. Angela Henderson is here today for a sick visit.   Current Status: Since her last office visit, Angela Henderson is doing well, and Angela Henderson has had a recent ED visit for dry mouth and urinary frequency on 09/26/2018. Blood glucose levels were elevated at 336, and decreased to 276 by discharge. Angela Henderson was advised to follow up with PCP. Angela Henderson also reports vaginal itching and burning.   Angela Henderson admits to frequent urination, hunger, fatigue, excessive hunger, and excessive thirst, blurred vision. Angela Henderson denies weight gain, weight loss, and poor wound healing.   Angela Henderson denies fevers, chills, fatigue, recent infections, weight loss, and night sweats. Angela Henderson has not had any headaches, visual changes, dizziness, and falls. No chest pain, heart palpitations, cough and shortness of breath reported. No reports of GI problems such as nausea, vomiting, diarrhea, and constipation. Angela Henderson has no reports of blood in stools, dysuria and hematuria. No depression or anxiety reported. Angela Henderson denies pain today.   Past Medical History:  Diagnosis Date  . DKA, type 2 ([place]) 1st admission 11/02/2015  . Hyperlipidemia   . Type II diabetes mellitus ([place]) dx'd 09/2015   mother and grandmother are type 63, dad type 2    Family History  Problem Relation Age of Onset  . Diabetes Mother        Type I  . Hypertension Mother   . Diabetes Father   . Hypertension Father   . Diabetes Cousin        Type I  . Cancer Maternal Aunt     Social History   Socioeconomic History  . Marital status: Single    Spouse name: Not on file  . Number of children: Not on file  . Years of education: Not on file  . Highest education level: Not on file  Occupational History  . Not on file   Social Needs  . Financial resource strain: Not on file  . Food insecurity:    Worry: Not on file    Inability: Not on file  . Transportation needs:    Medical: Not on file    Non-medical: Not on file  Tobacco Use  . Smoking status: Never Smoker  . Smokeless tobacco: Never Used  Substance and Sexual Activity  . Alcohol use: No    Alcohol/week: 0.0 standard drinks    Comment: 11/02/2015 "1-2 drinks on weekend maybe once/month"  . Drug use: No  . Sexual activity: Yes  Lifestyle  . Physical activity:    Days per week: Not on file    Minutes per session: Not on file  . Stress: Not on file  Relationships  . Social connections:    Talks on phone: Not on file    Gets together: Not on file    Attends religious service: Not on file    Active member of club or organization: Not on file    Attends meetings of clubs or organizations: Not on file    Relationship status: Not on file  . Intimate partner violence:    Fear of current or ex partner: Not on file    Emotionally abused: Not on file    Physically abused: Not on file  Forced sexual activity: Not on file  Other Topics Concern  . Not on file  Social History Narrative  . Not on file    Past Surgical History:  Procedure Laterality Date  . WISDOM TOOTH EXTRACTION      Immunization History  Administered Date(s) Administered  . Influenza Whole 11/01/2007, 08/24/2008  . Influenza-Unspecified 09/18/2015  . Pneumococcal Polysaccharide-23 11/03/2015  . Td 07/03/2010  . Tdap 02/20/2017    No outpatient medications have been marked as taking for the 09/27/18 encounter (Office Visit) with Azzie Glatter, FNP.    No Known Allergies  BP 126/88 (BP Location: Left Arm, Patient Position: Sitting, Cuff Size: Large)   Pulse 72   Temp 98.2 F (36.8 C) (Oral)   Ht _0  (1.676 m)   Wt 201 lb (91.2 kg)   SpO2 100%   BMI 32.44 kg/m   Review of Systems  Constitutional: Negative.   HENT: Negative.   Eyes: Negative.    Respiratory: Negative.   Cardiovascular: Negative.   Gastrointestinal: Negative.   Endocrine: Positive for polydipsia, polyphagia and polyuria.  Genitourinary: Positive for frequency.  Allergic/Immunologic: Negative.   Neurological: Negative.   Hematological: Negative.    Objective:   Physical Exam  Constitutional: Angela Henderson is oriented to person, place, and time. Angela Henderson appears well-developed and well-nourished.  HENT:  Head: Normocephalic and atraumatic.  Eyes: Pupils are equal, round, and reactive to light. Conjunctivae and EOM are normal.  Neck: Normal range of motion. Neck supple.  Cardiovascular: Normal rate, regular rhythm, normal heart sounds and intact distal pulses.  Pulmonary/Chest: Effort normal and breath sounds normal.  Abdominal: Soft. Bowel sounds are normal.  Musculoskeletal: Normal range of motion.  Neurological: Angela Henderson is alert and oriented to person, place, and time.  Skin: Skin is warm and dry.  Psychiatric: Angela Henderson has a normal mood and affect. Her behavior is normal. Judgment and thought content normal.  Nursing note and vitals reviewed.  Assessment & Plan:   1. Type 2 diabetes mellitus without complication, without long-term current use of insulin (HCC) Blood glucose level is at 306, and Hgb A1c at 10.6 today. We will initiate Novolog TID with meals for blood glucose levels less than 125.  We will also initiate Metformin 500 mg BID to aid in decreasing levels. Lantus will be initiated also today to inject at bedtime. Monitor.  - POCT glycosylated hemoglobin (Hb A1C) - POCT glucose (manual entry) - blood glucose meter kit and supplies; Dispense based on patient and insurance preference. Use up to four times daily as directed. (FOR ICD-10 E10.9, E11.9).  Dispense: 1 each; Refill: 0  2. Elevated glucose We will initiate Metformin and insulin today.  - insulin lispro (HUMALOG) injection 10 Units - POCT glucose (manual entry)  3. Dysuria - POCT urinalysis dipstick  4.  Abnormal urinalysis Urine culture is pending.  - Urine Culture  5. Yeast infection Probable r/t Diabetes.  - fluconazole (DIFLUCAN) 150 MG tablet; Take 1 tablet (150 mg total) by mouth once for 1 dose.  Dispense: 1 tablet; Refill: 0  6. Follow up Angela Henderson will follow up in 1 month.   Meds ordered this encounter  Medications  . fluconazole (DIFLUCAN) 150 MG tablet    Sig: Take 1 tablet (150 mg total) by mouth once for 1 dose.    Dispense:  1 tablet    Refill:  0  . insulin lispro (HUMALOG) injection 10 Units  . blood glucose meter kit and supplies    Sig: Dispense  based on patient and insurance preference. Use up to four times daily as directed. (FOR ICD-10 E10.9, E11.9).    Dispense:  1 each    Refill:  0    Order Specific Question:   Number of strips    Answer:   100    Order Specific Question:   Number of lancets    Answer:   100  . DISCONTD: Insulin Glargine (LANTUS SOLOSTAR) 100 UNIT/ML Solostar Pen    Sig: Inject 15 Units into the skin daily at 10 pm.    Dispense:  5 pen    Refill:  3  . insulin aspart (NOVOLOG) 100 UNIT/ML FlexPen    Sig: Inject 10 Units into the skin 3 (three) times daily with meals.    Dispense:  15 mL    Refill:  3  . metFORMIN (GLUCOPHAGE) 500 MG tablet    Sig: Take 1 tablet (500 mg total) by mouth 2 (two) times daily with a meal.    Dispense:  60 tablet    Refill:  2  . insulin detemir (LEVEMIR) 100 UNIT/ML injection    Sig: Inject 0.15 mLs (15 Units total) into the skin at bedtime.    Dispense:  10 mL    Refill:  [place],  MSN, [facility] Patient [place] [address] [place], [place] [address] [phone]

## 2018-09-27 NOTE — Addendum Note (Signed)
Addended by: Kallie Locks on: 09/27/2018 03:46 PM   Modules accepted: Orders

## 2018-09-29 LAB — URINE CULTURE

## 2018-09-30 ENCOUNTER — Other Ambulatory Visit: Payer: Self-pay | Admitting: Family Medicine

## 2018-09-30 DIAGNOSIS — B955 Unspecified streptococcus as the cause of diseases classified elsewhere: Secondary | ICD-10-CM

## 2018-09-30 MED ORDER — AMOXICILLIN 500 MG PO CAPS: 500.0000 mg | ORAL_CAPSULE | Freq: Two times a day (BID) | ORAL | 0 refills | Status: DC

## 2018-09-30 NOTE — Progress Notes (Signed)
Rx for Amoxicillin sent to pharmacy today.

## 2018-10-01 NOTE — Telephone Encounter (Signed)
-----   Message from Kallie LocksNatalie M Stroud, FNP sent at 09/30/2018  9:15 PM EST ----- Regarding: "Lab Results" Lyla SonCarrie,   Please call patient to inform that she is positive for bacteria. Rx for Amoxicillin 500 mg BID X 7 days sent to pharmacy today. She is to complete all medication as prescribed.   Thank you!

## 2018-10-01 NOTE — Telephone Encounter (Signed)
Patient notified

## 2018-10-27 ENCOUNTER — Ambulatory Visit: Payer: BLUE CROSS/BLUE SHIELD | Admitting: Family Medicine

## 2018-11-01 ENCOUNTER — Ambulatory Visit (INDEPENDENT_AMBULATORY_CARE_PROVIDER_SITE_OTHER): Payer: BLUE CROSS/BLUE SHIELD | Admitting: Family Medicine

## 2018-11-01 ENCOUNTER — Encounter: Payer: Self-pay | Admitting: Family Medicine

## 2018-11-01 VITALS — BP 136/80 | HR 76 | Temp 98.3°F | Ht 66.0 in | Wt 201.0 lb

## 2018-11-01 DIAGNOSIS — N926 Irregular menstruation, unspecified: Secondary | ICD-10-CM

## 2018-11-01 DIAGNOSIS — R0981 Nasal congestion: Secondary | ICD-10-CM

## 2018-11-01 DIAGNOSIS — R8290 Unspecified abnormal findings in urine: Secondary | ICD-10-CM | POA: Diagnosis not present

## 2018-11-01 DIAGNOSIS — R05 Cough: Secondary | ICD-10-CM | POA: Diagnosis not present

## 2018-11-01 DIAGNOSIS — E119 Type 2 diabetes mellitus without complications: Secondary | ICD-10-CM

## 2018-11-01 DIAGNOSIS — Z09 Encounter for follow-up examination after completed treatment for conditions other than malignant neoplasm: Secondary | ICD-10-CM

## 2018-11-01 DIAGNOSIS — J302 Other seasonal allergic rhinitis: Secondary | ICD-10-CM

## 2018-11-01 DIAGNOSIS — R059 Cough, unspecified: Secondary | ICD-10-CM

## 2018-11-01 LAB — POCT URINALYSIS DIP (MANUAL ENTRY)
Bilirubin, UA: NEGATIVE
Blood, UA: NEGATIVE
Glucose, UA: 100 mg/dL — AB
Ketones, POC UA: NEGATIVE mg/dL
Nitrite, UA: NEGATIVE
Protein Ur, POC: NEGATIVE mg/dL
Spec Grav, UA: 1.02 (ref 1.010–1.025)
Urobilinogen, UA: 0.2 E.U./dL
pH, UA: 5.5 (ref 5.0–8.0)

## 2018-11-01 LAB — GLUCOSE, POCT (MANUAL RESULT ENTRY): POC Glucose: 158 mg/dl — AB (ref 70–99)

## 2018-11-01 LAB — POCT URINE PREGNANCY: Preg Test, Ur: NEGATIVE

## 2018-11-01 MED ORDER — CETIRIZINE HCL 10 MG PO TABS: 10.0000 mg | ORAL_TABLET | Freq: Every day | ORAL | 11 refills | Status: DC

## 2018-11-01 MED ORDER — OXYMETAZOLINE HCL 0.05 % NA SOLN: 1.0000 | Freq: Two times a day (BID) | NASAL | 0 refills | Status: DC

## 2018-11-01 MED ORDER — GUAIFENESIN ER 600 MG PO TB12: 600.0000 mg | ORAL_TABLET | Freq: Two times a day (BID) | ORAL | 1 refills | Status: DC

## 2018-11-01 NOTE — Patient Instructions (Signed)
Guaifenesin oral ER tablets What is this medicine? GUAIFENESIN (gwye FEN e sin) is an expectorant. It helps to thin mucous and make coughs more productive. This medicine is used to treat coughs caused by colds or the flu. It is not intended to treat chronic cough caused by smoking, asthma, emphysema, or heart failure. This medicine may be used for other purposes; ask your health care provider or pharmacist if you have questions. COMMON BRAND NAME(S): Humibid, Mucinex What should I tell my health care provider before I take this medicine? They need to know if you have any of these conditions: -fever -kidney disease -an unusual or allergic reaction to guaifenesin, other medicines, foods, dyes, or preservatives -pregnant or trying to get pregnant -breast-feeding How should I use this medicine? Take this medicine by mouth with a full glass of water. Follow the directions on the prescription label. Do not break, chew or crush this medicine. You may take with food or on an empty stomach. Take your medicine at regular intervals. Do not take your medicine more often than directed. Talk to your pediatrician regarding the use of this medicine in children. While this drug may be prescribed for children as young as 29 years old for selected conditions, precautions do apply. Overdosage: If you think you have taken too much of this medicine contact a poison control center or emergency room at once. NOTE: This medicine is only for you. Do not share this medicine with others. What if I miss a dose? If you miss a dose, take it as soon as you can. If it is almost time for your next dose, take only that dose. Do not take double or extra doses. What may interact with this medicine? Interactions are not expected. This list may not describe all possible interactions. Give your health care provider a list of all the medicines, herbs, non-prescription drugs, or dietary supplements you use. Also tell them if you smoke,  drink alcohol, or use illegal drugs. Some items may interact with your medicine. What should I watch for while using this medicine? Do not treat a cough for more than 1 week without consulting your doctor or health care professional. If you also have a high fever, skin rash, continuing headache, or sore throat, see your doctor. For best results, drink 6 to 8 glasses water daily while you are taking this medicine. What side effects may I notice from receiving this medicine? Side effects that you should report to your doctor or health care professional as soon as possible: -allergic reactions like skin rash, itching or hives, swelling of the face, lips, or tongue Side effects that usually do not require medical attention (report to your doctor or health care professional if they continue or are bothersome): -dizziness -headache -stomach upset This list may not describe all possible side effects. Call your doctor for medical advice about side effects. You may report side effects to FDA at 1-800-FDA-1088. Where should I keep my medicine? Keep out of the reach of children. Store at room temperature between 20 and 25 degrees C (68 and 77 degrees F). Keep container tightly closed. Throw away any unused medicine after the expiration date. NOTE: This sheet is a summary. It may not cover all possible information. If you have questions about this medicine, talk to your doctor, pharmacist, or health care provider.  2018 Elsevier/Gold Standard (2008-03-15 12:14:14) Cetirizine tablets What is this medicine? CETIRIZINE (se TI ra zeen) is an antihistamine. This medicine is used to treat or prevent  symptoms of allergies. It is also used to help reduce itchy skin rash and hives. This medicine may be used for other purposes; ask your health care provider or pharmacist if you have questions. COMMON BRAND NAME(S): All Day Allergy, Zyrtec, Zyrtec Hives Relief What should I tell my health care provider before I take  this medicine? They need to know if you have any of these conditions: -kidney disease -liver disease -an unusual or allergic reaction to cetirizine, hydroxyzine, other medicines, foods, dyes, or preservatives -pregnant or trying to get pregnant -breast-feeding How should I use this medicine? Take this medicine by mouth with a glass of water. Follow the directions on the prescription label. You can take this medicine with food or on an empty stomach. Take your medicine at regular times. Do not take more often than directed. You may need to take this medicine for several days before your symptoms improve. Talk to your pediatrician regarding the use of this medicine in children. Special care may be needed. While this drug may be prescribed for children as young as 66 years of age for selected conditions, precautions do apply. Overdosage: If you think you have taken too much of this medicine contact a poison control center or emergency room at once. NOTE: This medicine is only for you. Do not share this medicine with others. What if I miss a dose? If you miss a dose, take it as soon as you can. If it is almost time for your next dose, take only that dose. Do not take double or extra doses. What may interact with this medicine? -alcohol -certain medicines for anxiety or sleep -narcotic medicines for pain -other medicines for colds or allergies This list may not describe all possible interactions. Give your health care provider a list of all the medicines, herbs, non-prescription drugs, or dietary supplements you use. Also tell them if you smoke, drink alcohol, or use illegal drugs. Some items may interact with your medicine. What should I watch for while using this medicine? Visit your doctor or health care professional for regular checks on your health. Tell your doctor if your symptoms do not improve. You may get drowsy or dizzy. Do not drive, use machinery, or do anything that needs mental  alertness until you know how this medicine affects you. Do not stand or sit up quickly, especially if you are an older patient. This reduces the risk of dizzy or fainting spells. Your mouth may get dry. Chewing sugarless gum or sucking hard candy, and drinking plenty of water may help. Contact your doctor if the problem does not go away or is severe. What side effects may I notice from receiving this medicine? Side effects that you should report to your doctor or health care professional as soon as possible: -allergic reactions like skin rash, itching or hives, swelling of the face, lips, or tongue -changes in vision or hearing -fast or irregular heartbeat -trouble passing urine or change in the amount of urine Side effects that usually do not require medical attention (report to your doctor or health care professional if they continue or are bothersome): -dizziness -dry mouth -irritability -sore throat -stomach pain -tiredness This list may not describe all possible side effects. Call your doctor for medical advice about side effects. You may report side effects to FDA at 1-800-FDA-1088. Where should I keep my medicine? Keep out of the reach of children. Store at room temperature between 15 and 30 degrees C (59 and 86 degrees F). Throw away  any unused medicine after the expiration date. NOTE: This sheet is a summary. It may not cover all possible information. If you have questions about this medicine, talk to your doctor, pharmacist, or health care provider.  2018 Elsevier/Gold Standard (2014-11-28 13:44:42)

## 2018-11-01 NOTE — Progress Notes (Signed)
Sick Visit  Subjective:    Patient ID: Angela Henderson, female    DOB: [birth year], 27 y.o.   MRN: [id number]  Chief Complaint  Patient presents with  . Follow-up    diabetes   HPI  Angela Henderson is a 27 year old female with a past medical history of Diabetes, and Hyperlipidemia. She is here today for a sick visit.   Current Status: Since her last office visit, she has been having a cough, nasal congestion, and sneezing for the past 2-3 weeks. She has been using OTC medications with no relief with cough. She feels that her cough has worsened. She reports mild diarrhea. She is currently not taking Metformin because of increased GI side effects and is unable to do job duties. She states that she is taking all other medications as prescribed.   She denies fevers, chills, fatigue, recent infections, weight loss, and night sweats. She has not had any headaches, visual changes, dizziness, and falls. No chest pain, heart palpitations, and shortness of breath reported. No reports of GI problems such as nausea, vomiting, and constipation. She has no reports of blood in stools, dysuria and hematuria. No depression or anxiety, and denies suicidal ideations, homicidal ideations, or auditory hallucinations. She denies pain today.   Review of Systems  Constitutional: Negative.   HENT: Negative.   Eyes: Negative.   Respiratory: Negative.   Cardiovascular: Negative.   Gastrointestinal: Positive for abdominal distention.  Endocrine: Negative.   Genitourinary: Negative.   Musculoskeletal: Negative.   Skin: Negative.   Allergic/Immunologic: Negative.   Neurological: Negative.   Hematological: Negative.   Psychiatric/Behavioral: Negative.    Objective:   Physical Exam Constitutional:      Appearance: Normal appearance. She is obese.  HENT:     Head: Normocephalic and atraumatic.     Right Ear: Tympanic membrane, ear canal and external ear normal.     Left Ear: Tympanic membrane, ear canal and  external ear normal.     Nose: Congestion present.     Mouth/Throat:     Mouth: Mucous membranes are moist.     Comments: Erythema Eyes:     Conjunctiva/sclera: Conjunctivae normal.     Pupils: Pupils are equal, round, and reactive to light.  Neck:     Musculoskeletal: Normal range of motion and neck supple.  Cardiovascular:     Rate and Rhythm: Normal rate and regular rhythm.     Pulses: Normal pulses.  Pulmonary:     Effort: Pulmonary effort is normal.     Breath sounds: Normal breath sounds.  Abdominal:     General: Bowel sounds are normal.     Palpations: Abdomen is soft.  Musculoskeletal: Normal range of motion.  Skin:    General: Skin is warm and dry.  Neurological:     Mental Status: She is alert.  Psychiatric:        Mood and Affect: Mood normal.        Behavior: Behavior normal.        Thought Content: Thought content normal.        Judgment: Judgment normal.    Assessment & Plan:   1. Type 2 diabetes mellitus without complication, without long-term current use of insulin (HCC) Improved. Glucose level is 158 today, from 341 on 09/27/2018. She has discontinues Metformin because of GI side effects. She will continue Novolog and Levemir as prescribed.  She will continue to decrease foods/beverages high in sugars and carbs and follow Heart Healthy or  DASH diet. Increase physical activity to at least 30 minutes cardio exercise daily.  - POCT urinalysis dipstick - POCT glucose (manual entry) - Microalbumin, urine  2. Abnormal urinalysis - Urine Culture  3. Cough Worsening. We will initiate Mucinex today. She will report to office if cough does not improve.   4. Mild nasal congestion - guaiFENesin (MUCINEX) 600 MG 12 hr tablet; Take 1 tablet (600 mg total) by mouth 2 (two) times daily.  Dispense: 60 tablet; Refill: 1 - Urine Culture  5. Seasonal allergies We will initiate Zyrtec today.  - cetirizine (ZYRTEC) 10 MG tablet; Take 1 tablet (10 mg total) by mouth  daily.  Dispense: 30 tablet; Refill: 11  6. Encounter for diabetic foot exam (HCC) Negative. Foot exam tolerated well. No decreased sensitivity noted upon foot exam. Patient counseled on proper foot hygiene. She is encouraged to exam feet often (daily), using mirror if necessary; keep feet clean and dry (especially between toes), keep feet moistened, wear cotton socks, and avoid wearing open-toed shoes, high-heel shoes, and sandals. Patient verbalized understanding.  - HM Diabetes Foot Exam  7. Irregular periods Negative pregnancy test today. - POCT urine pregnancy  8. Follow up She will follow up in 3 months.   Meds ordered this encounter  Medications  . guaiFENesin (MUCINEX) 600 MG 12 hr tablet    Sig: Take 1 tablet (600 mg total) by mouth 2 (two) times daily.    Dispense:  60 tablet    Refill:  1  . cetirizine (ZYRTEC) 10 MG tablet    Sig: Take 1 tablet (10 mg total) by mouth daily.    Dispense:  30 tablet    Refill:  11    Raliegh IpNatalie Tennile Styles,  MSN, [facility] Center[facility]NP-C Patient [facility] [address]  [place], [place] [id number] [phone]

## 2018-11-02 LAB — MICROALBUMIN, URINE: Microalbumin, Urine: 18.5 ug/mL

## 2018-11-03 LAB — URINE CULTURE

## 2018-12-31 LAB — HM DIABETES EYE EXAM

## 2019-01-07 ENCOUNTER — Ambulatory Visit: Payer: BLUE CROSS/BLUE SHIELD | Admitting: Family Medicine

## 2019-02-04 ENCOUNTER — Ambulatory Visit (INDEPENDENT_AMBULATORY_CARE_PROVIDER_SITE_OTHER): Payer: BLUE CROSS/BLUE SHIELD | Admitting: Family Medicine

## 2019-02-04 ENCOUNTER — Other Ambulatory Visit: Payer: Self-pay

## 2019-02-04 DIAGNOSIS — I10 Essential (primary) hypertension: Secondary | ICD-10-CM

## 2019-02-04 DIAGNOSIS — E119 Type 2 diabetes mellitus without complications: Secondary | ICD-10-CM | POA: Diagnosis not present

## 2019-02-04 DIAGNOSIS — Z09 Encounter for follow-up examination after completed treatment for conditions other than malignant neoplasm: Secondary | ICD-10-CM | POA: Diagnosis not present

## 2019-02-04 NOTE — Progress Notes (Deleted)
Patient Angela Henderson  Telephone Office Visit  Subjective:  Patient ID: Angela Henderson, female    DOB: 1991-10-30  Age: 28 y.o. MRN: [id number]  CC: No chief complaint on file.   HPI Angela Henderson presents for ***  Past Medical History:  Diagnosis Date  . DKA, type 2 ([place]) 1st admission 11/02/2015  . Hyperlipidemia   . Type II diabetes mellitus ([place]) dx'd 09/2015   mother and grandmother are type 1, dad type 2    Past Surgical History:  Procedure Laterality Date  . WISDOM TOOTH EXTRACTION      Family History  Problem Relation Age of Onset  . Diabetes Mother        Type I  . Hypertension Mother   . Diabetes Father   . Hypertension Father   . Diabetes Cousin        Type I  . Cancer Maternal Aunt     Social History   Socioeconomic History  . Marital status: Single    Spouse name: Not on file  . Number of children: Not on file  . Years of education: Not on file  . Highest education level: Not on file  Occupational History  . Not on file  Social Needs  . Financial resource strain: Not on file  . Food insecurity:    Worry: Not on file    Inability: Not on file  . Transportation needs:    Medical: Not on file    Non-medical: Not on file  Tobacco Use  . Smoking status: Never Smoker  . Smokeless tobacco: Never Used  Substance and Sexual Activity  . Alcohol use: No    Alcohol/week: 0.0 standard drinks    Comment: 11/02/2015 "1-2 drinks on weekend maybe once/month"  . Drug use: No  . Sexual activity: Yes  Lifestyle  . Physical activity:    Days per week: Not on file    Minutes per session: Not on file  . Stress: Not on file  Relationships  . Social connections:    Talks on phone: Not on file    Gets together: Not on file    Attends religious service: Not on file    Active member of club or organization: Not on file    Attends meetings of clubs or organizations: Not on file    Relationship status: Not on  file  . Intimate partner violence:    Fear of current or ex partner: Not on file    Emotionally abused: Not on file    Physically abused: Not on file    Forced sexual activity: Not on file  Other Topics Concern  . Not on file  Social History Narrative  . Not on file    Outpatient Medications Prior to Visit  Medication Sig Dispense Refill  . blood glucose meter kit and supplies Dispense based on patient and insurance preference. Use up to four times daily as directed. (FOR ICD-10 E10.9, E11.9). 1 each 0  . cetirizine (ZYRTEC) 10 MG tablet Take 1 tablet (10 mg total) by mouth daily. 30 tablet 11  . glucose blood test strip Use as instructed 100 each 12  . guaiFENesin (MUCINEX) 600 MG 12 hr tablet Take 1 tablet (600 mg total) by mouth 2 (two) times daily. 60 tablet 1  . insulin aspart (NOVOLOG) 100 UNIT/ML FlexPen Inject 10 Units into the skin 3 (three) times daily with meals. 15 mL 3  . insulin detemir (LEVEMIR)  100 UNIT/ML injection Inject 0.15 mLs (15 Units total) into the skin at bedtime. 10 mL 11  . metFORMIN (GLUCOPHAGE) 500 MG tablet Take 1 tablet (500 mg total) by mouth 2 (two) times daily with a meal. (Patient not taking: Reported on 11/01/2018) 60 tablet 2  . oxymetazoline (AFRIN NASAL SPRAY) 0.05 % nasal spray Place 1 spray into both nostrils 2 (two) times daily. 30 mL 0   No facility-administered medications prior to visit.     No Known Allergies  ROS Review of Systems    Objective:    Physical Exam  There were no vitals taken for this visit. Wt Readings from Last 3 Encounters:  11/01/18 201 lb (91.2 kg)  09/27/18 201 lb (91.2 kg)  09/26/18 198 lb (89.8 kg)     Health Maintenance Due  Topic Date Due  . INFLUENZA VACCINE  06/17/2018  . OPHTHALMOLOGY EXAM  08/26/2018    There are no preventive Henderson reminders to display for this patient.  Lab Results  Component Value Date   TSH 2.31 12/16/2016   Lab Results  Component Value Date   WBC 6.3 09/26/2018    HGB 9.8 (L) 09/26/2018   HCT 33.9 (L) 09/26/2018   MCV 81.5 09/26/2018   PLT 328 09/26/2018   Lab Results  Component Value Date   NA 134 (L) 09/26/2018   K 4.0 09/26/2018   CO2 24 09/26/2018   GLUCOSE 344 (H) 09/26/2018   BUN 8 09/26/2018   CREATININE 0.80 09/26/2018   BILITOT 0.5 12/17/2016   ALKPHOS 37 (L) 12/17/2016   AST 26 12/17/2016   ALT 26 12/17/2016   PROT 7.3 12/17/2016   ALBUMIN 3.6 12/17/2016   CALCIUM 9.0 09/26/2018   ANIONGAP 6 09/26/2018   GFR 128.62 10/31/2016   Lab Results  Component Value Date   CHOL 131 12/16/2016   Lab Results  Component Value Date   HDL 39 (L) 12/16/2016   Lab Results  Component Value Date   LDLCALC 65 12/16/2016   Lab Results  Component Value Date   TRIG 134 12/16/2016   Lab Results  Component Value Date   CHOLHDL 3.4 12/16/2016   Lab Results  Component Value Date   HGBA1C 10.3 (A) 09/27/2018      Assessment & Plan:   Problem List Items Addressed This Visit    None      No orders of the defined types were placed in this encounter.   Follow-up: No follow-ups on file.    Azzie Glatter, FNP

## 2019-02-05 ENCOUNTER — Encounter: Payer: Self-pay | Admitting: Family Medicine

## 2019-02-05 DIAGNOSIS — I10 Essential (primary) hypertension: Secondary | ICD-10-CM | POA: Insufficient documentation

## 2019-02-05 HISTORY — DX: Essential (primary) hypertension: I10

## 2019-02-05 MED ORDER — INSULIN ASPART 100 UNIT/ML FLEXPEN: 10.0000 [IU] | PEN_INJECTOR | Freq: Three times a day (TID) | SUBCUTANEOUS | 6 refills | Status: DC

## 2019-02-05 MED ORDER — INSULIN DETEMIR 100 UNIT/ML ~~LOC~~ SOLN: 15.0000 [IU] | Freq: Every day | SUBCUTANEOUS | 6 refills | Status: DC

## 2019-02-05 NOTE — Progress Notes (Addendum)
Virtual Visit via Telephone Note  I connected with Angela Henderson on 02/04/2019 at 10:00 AM EDT by telephone and verified that I am speaking with the correct person using two identifiers.   I discussed the limitations, risks, security and privacy concerns of performing an evaluation and management service by telephone and the availability of in person appointments. I also discussed with the patient that there may be a patient responsible charge related to this service. The patient expressed understanding and agreed to proceed.   History of Present Illness: Angela Henderson is a 28 year old female who presents for follow up Hypertension and Diabetes.   Her normal range of preprandial blood glucose levels are between 76-200. She denies fatigue, frequent urination, blurred vision, excessive hunger, excessive thirst, weight gain, weight loss, and poor wound healing. She denies visual changes, chest pain, cough, shortness of breath, heart palpitations, and falls. She has occasional headaches and dizziness with position changes. Denies severe headaches, confusion, seizures, double vision, and blurred vision, nausea and vomiting. Her anxiety is stable today. She denies pain today.   Observations/Objective: Virtual telephone visit.   Assessment and Plan:  1. Type 2 diabetes mellitus without complication, without long-term current use of insulin (HCC) She continues to use Metformin and Insulin as prescribed. She will continue to decrease foods/beverages high in sugars and carbs and follow Heart Healthy or DASH diet. Increase physical activity to at least 30 minutes cardio exercise daily.   2. Essential hypertension She states that blood pressures have been stable. She will continue to decrease high sodium intake, excessive alcohol intake, increase potassium intake, smoking cessation, and increase physical activity of at least 30 minutes of cardio activity daily. She will continue to follow Heart Healthy  or DASH diet.  3. Follow up She will follow up in 1 month.   No orders of the defined types were placed in this encounter.   No orders of the defined types were placed in this encounter.  Referral Orders  No referral(s) requested today   Raliegh Ip,  MSN, FNP-C [facility] [address] [place], [place] [address] [phone]   Follow Up Instructions: She will follow up in 1 month for assessment of Diabetes.   I discussed the assessment and treatment plan with the patient. The patient was provided an opportunity to ask questions and all were answered. The patient agreed with the plan and demonstrated an understanding of the instructions.   The patient was advised to call back or seek an in-person evaluation if the symptoms worsen or if the condition fails to improve as anticipated.  I provided 15 minutes of non-face-to-face time during this encounter.   Kallie Locks, FNP

## 2019-02-05 NOTE — Addendum Note (Signed)
Addended by: Kallie Locks on: 02/05/2019 09:48 PM   Modules accepted: Orders

## 2019-03-03 ENCOUNTER — Other Ambulatory Visit: Payer: Self-pay | Admitting: Family Medicine

## 2019-03-03 DIAGNOSIS — E119 Type 2 diabetes mellitus without complications: Secondary | ICD-10-CM

## 2019-03-30 DIAGNOSIS — E1165 Type 2 diabetes mellitus with hyperglycemia: Secondary | ICD-10-CM | POA: Diagnosis not present

## 2019-03-30 DIAGNOSIS — Z794 Long term (current) use of insulin: Secondary | ICD-10-CM | POA: Diagnosis not present

## 2019-04-05 ENCOUNTER — Other Ambulatory Visit: Payer: Self-pay

## 2019-04-05 ENCOUNTER — Ambulatory Visit (INDEPENDENT_AMBULATORY_CARE_PROVIDER_SITE_OTHER): Payer: BLUE CROSS/BLUE SHIELD | Admitting: Family Medicine

## 2019-04-05 ENCOUNTER — Encounter: Payer: Self-pay | Admitting: Family Medicine

## 2019-04-05 VITALS — BP 146/90 | HR 70 | Temp 98.0°F | Ht 66.0 in | Wt 209.0 lb

## 2019-04-05 DIAGNOSIS — Z09 Encounter for follow-up examination after completed treatment for conditions other than malignant neoplasm: Secondary | ICD-10-CM | POA: Diagnosis not present

## 2019-04-05 DIAGNOSIS — E119 Type 2 diabetes mellitus without complications: Secondary | ICD-10-CM | POA: Diagnosis not present

## 2019-04-05 DIAGNOSIS — R7309 Other abnormal glucose: Secondary | ICD-10-CM | POA: Diagnosis not present

## 2019-04-05 DIAGNOSIS — R8290 Unspecified abnormal findings in urine: Secondary | ICD-10-CM | POA: Diagnosis not present

## 2019-04-05 DIAGNOSIS — E1165 Type 2 diabetes mellitus with hyperglycemia: Secondary | ICD-10-CM

## 2019-04-05 DIAGNOSIS — I10 Essential (primary) hypertension: Secondary | ICD-10-CM | POA: Diagnosis not present

## 2019-04-05 DIAGNOSIS — R739 Hyperglycemia, unspecified: Secondary | ICD-10-CM

## 2019-04-05 DIAGNOSIS — Z794 Long term (current) use of insulin: Secondary | ICD-10-CM

## 2019-04-05 LAB — POCT GLYCOSYLATED HEMOGLOBIN (HGB A1C): Hemoglobin A1C: 11.7 % — AB (ref 4.0–5.6)

## 2019-04-05 LAB — POCT URINALYSIS DIP (MANUAL ENTRY)
Bilirubin, UA: NEGATIVE
Glucose, UA: 500 mg/dL — AB
Ketones, POC UA: NEGATIVE mg/dL
Nitrite, UA: NEGATIVE
Protein Ur, POC: NEGATIVE mg/dL
Spec Grav, UA: 1.02 (ref 1.010–1.025)
Urobilinogen, UA: 0.2 E.U./dL
pH, UA: 5.5 (ref 5.0–8.0)

## 2019-04-05 LAB — GLUCOSE, POCT (MANUAL RESULT ENTRY): POC Glucose: 246 mg/dl — AB (ref 70–99)

## 2019-04-05 MED ORDER — SITAGLIPTIN PHOSPHATE 100 MG PO TABS: 100.0000 mg | ORAL_TABLET | Freq: Every day | ORAL | 1 refills | Status: DC

## 2019-04-05 MED ORDER — INSULIN DETEMIR 100 UNIT/ML ~~LOC~~ SOLN: 30.0000 [IU] | Freq: Every day | SUBCUTANEOUS | 11 refills | Status: DC

## 2019-04-05 MED ORDER — GLIPIZIDE 10 MG PO TABS: 10.0000 mg | ORAL_TABLET | Freq: Two times a day (BID) | ORAL | 3 refills | Status: DC

## 2019-04-05 MED ORDER — FREESTYLE LIBRE READER DEVI: 1.0000 | Freq: Three times a day (TID) | 0 refills | Status: AC | PRN

## 2019-04-05 MED ORDER — IBUPROFEN 800 MG PO TABS: 800.0000 mg | ORAL_TABLET | Freq: Three times a day (TID) | ORAL | 3 refills | Status: DC | PRN

## 2019-04-05 NOTE — Patient Instructions (Signed)
Muscle Strain A muscle strain is an injury that happens when a muscle is stretched longer than normal. This can happen during a fall, sports, or lifting. This can tear some muscle fibers. Usually, recovery from muscle strain takes 1-2 weeks. Complete healing normally takes 5-6 weeks. This condition is first treated with PRICE therapy. This involves:  Protecting your muscle from being injured again.  Resting your injured muscle.  Icing your injured muscle.  Applying pressure (compression) to your injured muscle. This may be done with a splint or elastic bandage.  Raising (elevating) your injured muscle. Your doctor may also recommend medicine for pain. Follow these instructions at home: If you have a splint:  Wear the splint as told by your doctor. Take it off only as told by your doctor.  Loosen the splint if your fingers or toes tingle, get numb, or turn cold and blue.  Keep the splint clean.  If the splint is not waterproof: ? Do not let it get wet. ? Cover it with a watertight covering when you take a bath or a shower. Managing pain, stiffness, and swelling   If directed, put ice on your injured area. ? If you have a removable splint, take it off as told by your doctor. ? Put ice in a plastic bag. ? Place a towel between your skin and the bag. ? Leave the ice on for 20 minutes, 2-3 times a day.  Move your fingers or toes often. This helps to avoid stiffness and lessen swelling.  Raise your injured area above the level of your heart while you are sitting or lying down.  Wear an elastic bandage as told by your doctor. Make sure it is not too tight. General instructions  Take over-the-counter and prescription medicines only as told by your doctor.  Limit your activity. Rest your injured muscle as told by your doctor. Your doctor may say that gentle movements are okay.  If physical therapy was prescribed, do exercises as told by your doctor.  Do not put pressure on any  part of the splint until it is fully hardened. This may take many hours.  Do not use any products that contain nicotine or tobacco, such as cigarettes and e-cigarettes. These can delay bone healing. If you need help quitting, ask your doctor.  Warm up before you exercise. This helps to prevent more muscle strains.  Ask your doctor when it is safe to drive if you have a splint.  Keep all follow-up visits as told by your doctor. This is important. Contact a doctor if:  You have more pain or swelling in your injured area. Get help right away if:  You have any of these problems in your injured area: ? You have numbness. ? You have tingling. ? You lose a lot of strength. Summary  A muscle strain is an injury that happens when a muscle is stretched longer than normal.  This condition is first treated with PRICE therapy. This includes protecting, resting, icing, adding pressure, and raising your injury.  Limit your activity. Rest your injured muscle as told by your doctor. Your doctor may say that gentle movements are okay.  Warm up before you exercise. This helps to prevent more muscle strains. This information is not intended to replace advice given to you by your health care provider. Make sure you discuss any questions you have with your health care provider. Document Released: 08/12/2008 Document Revised: 12/10/2016 Document Reviewed: 12/10/2016 Else[employer] Interactive Patient Education  2019 [employer] Ibuprofen tablets and capsules What is this medicine? IBUPROFEN (eye BYOO proe fen) is a non-steroidal anti-inflammatory drug (NSAID). It is used for dental pain, fever, headaches or migraines, osteoarthritis, rheumatoid arthritis, or painful monthly periods. It can also relieve minor aches and pains caused by a cold, flu, or sore throat. This medicine may be used for other purposes; ask your health care provider or pharmacist if you have questions. COMMON BRAND NAME(S): Advil, Advil  Junior Strength, Advil Migraine, Genpril, Ibren, IBU, Midol, Midol Cramps and Body Aches, Motrin, Motrin IB, Motrin Junior Strength, Motrin Migraine Pain, Samson-8, Toxicology Saliva Collection What should I tell my health care provider before I take this medicine? They need to know if you have any of these conditions: -cigarette smoker -coronary artery bypass graft (CABG) surgery within the past 2 weeks -drink more than 3 alcohol-containing drinks a day -heart disease -high blood pressure -history of stomach bleeding -kidney disease -liver disease -lung or breathing disease, like asthma -an unusual or allergic reaction to ibuprofen, aspirin, other NSAIDs, other medicines, foods, dyes, or preservatives -pregnant or trying to get pregnant -breast-feeding How should I use this medicine? Take this medicine by mouth with a glass of water. Follow the directions on the prescription label. Take this medicine with food if your stomach gets upset. Try to not lie down for at least 10 minutes after you take the medicine. Take your medicine at regular intervals. Do not take your medicine more often than directed. A special MedGuide will be given to you by the pharmacist with each prescription and refill. Be sure to read this information carefully each time. Talk to your pediatrician regarding the use of this medicine in children. Special care may be needed. Overdosage: If you think you have taken too much of this medicine contact a poison control center or emergency room at once. NOTE: This medicine is only for you. Do not share this medicine with others. What if I miss a dose? If you miss a dose, take it as soon as you can. If it is almost time for your next dose, take only that dose. Do not take double or extra doses. What may interact with this medicine? Do not take this medicine with any of the following medications: -cidofovir -ketorolac -methotrexate -pemetrexed This medicine may also interact  with the following medications: -alcohol -aspirin -diuretics -lithium -other drugs for inflammation like prednisone -warfarin This list may not describe all possible interactions. Give your health care provider a list of all the medicines, herbs, non-prescription drugs, or dietary supplements you use. Also tell them if you smoke, drink alcohol, or use illegal drugs. Some items may interact with your medicine. What should I watch for while using this medicine? Tell your doctor or healthcare professional if your symptoms do not start to get better or if they get worse. This medicine does not prevent heart attack or stroke. In fact, this medicine may increase the chance of a heart attack or stroke. The chance may increase with longer use of this medicine and in people who have heart disease. If you take aspirin to prevent heart attack or stroke, talk with your doctor or health care professional. Do not take other medicines that contain aspirin, ibuprofen, or naproxen with this medicine. Side effects such as stomach upset, nausea, or ulcers may be more likely to occur. Many medicines available without a prescription should not be taken with this medicine. This medicine can cause ulcers and bleeding in the stomach and intestines at  any time during treatment. Ulcers and bleeding can happen without warning symptoms and can cause death. To reduce your risk, do not smoke cigarettes or drink alcohol while you are taking this medicine. You may get drowsy or dizzy. Do not drive, use machinery, or do anything that needs mental alertness until you know how this medicine affects you. Do not stand or sit up quickly, especially if you are an older patient. This reduces the risk of dizzy or fainting spells. This medicine can cause you to bleed more easily. Try to avoid damage to your teeth and gums when you brush or floss your teeth. This medicine may be used to treat migraines. If you take migraine medicines for 10 or  more days a month, your migraines may get worse. Keep a diary of headache days and medicine use. Contact your healthcare professional if your migraine attacks occur more frequently. What side effects may I notice from receiving this medicine? Side effects that you should report to your doctor or health care professional as soon as possible: -allergic reactions like skin rash, itching or hives, swelling of the face, lips, or tongue -severe stomach pain -signs and symptoms of bleeding such as bloody or black, tarry stools; red or dark-brown urine; spitting up blood or brown material that looks like coffee grounds; red spots on the skin; unusual bruising or bleeding from the eye, gums, or nose -signs and symptoms of a blood clot such as changes in vision; chest pain; severe, sudden headache; trouble speaking; sudden numbness or weakness of the face, arm, or leg -unexplained weight gain or swelling -unusually weak or tired -yellowing of eyes or skin Side effects that usually do not require medical attention (report to your doctor or health care professional if they continue or are bothersome): -bruising -diarrhea -dizziness, drowsiness -headache -nausea, vomiting This list may not describe all possible side effects. Call your doctor for medical advice about side effects. You may report side effects to FDA at 1-800-FDA-1088. Where should I keep my medicine? Keep out of the reach of children. Store at room temperature between 15 and 30 degrees C (59 and 86 degrees F). Keep container tightly closed. Throw away any unused medicine after the expiration date. NOTE: This sheet is a summary. It may not cover all possible information. If you have questions about this medicine, talk to your doctor, pharmacist, or health care provider.  2019 Elsevier/Gold Standard (2017-07-08 12:43:57) Glipizide tablets What is this medicine? GLIPIZIDE (GLIP i zide) helps to treat type 2 diabetes. Treatment is combined  with diet and exercise. The medicine helps your body to use insulin better. This medicine may be used for other purposes; ask your health care provider or pharmacist if you have questions. COMMON BRAND NAME(S): Glucotrol What should I tell my health care provider before I take this medicine? They need to know if you have any of these conditions: -diabetic ketoacidosis -glucose-6-phosphate dehydrogenase deficiency -heart disease -kidney disease -liver disease -porphyria -severe infection or injury -thyroid disease -an unusual or allergic reaction to glipizide, sulfa drugs, other medicines, foods, dyes, or preservatives -pregnant or trying to get pregnant -breast-feeding How should I use this medicine? Take this medicine by mouth. Swallow with a drink of water. Do not take with food. Take it 30 minutes before a meal. Follow the directions on the prescription label. If you take this medicine once a day, take it 30 minutes before breakfast. Take your doses at the same time each day. Do not take more often  than directed. Talk to your pediatrician regarding the use of this medicine in children. Special care may be needed. Elderly patients over 76 years old may have a stronger reaction and need a smaller dose. Overdosage: If you think you have taken too much of this medicine contact a poison control center or emergency room at once. NOTE: This medicine is only for you. Do not share this medicine with others. What if I miss a dose? If you miss a dose, take it as soon as you can. If it is almost time for your next dose, take only that dose. Do not take double or extra doses. What may interact with this medicine? -bosentan -chloramphenicol -cisapride -clarithromycin -medicines for fungal or yeast infections -metoclopramide -probenecid -warfarin Many medications may cause an increase or decrease in blood sugar, these include: -alcohol containing beverages -aspirin and aspirin-like  drugs -chloramphenicol -chromium -diuretics -female hormones, like estrogens or progestins and birth control pills -heart medicines -isoniazid -female hormones or anabolic steroids -medicines for weight loss -medicines for allergies, asthma, cold, or cough -medicines for mental problems -medicines called MAO Inhibitors like Nardil, Parnate, Marplan, Eldepryl -niacin -NSAIDs, medicines for pain and inflammation, like ibuprofen or naproxen -pentamidine -phenytoin -probenecid -quinolone antibiotics like ciprofloxacin, levofloxacin, ofloxacin -some herbal dietary supplements -steroid medicines like prednisone or cortisone -thyroid medicine This list may not describe all possible interactions. Give your health care provider a list of all the medicines, herbs, non-prescription drugs, or dietary supplements you use. Also tell them if you smoke, drink alcohol, or use illegal drugs. Some items may interact with your medicine. What should I watch for while using this medicine? Visit your doctor or health care professional for regular checks on your progress. A test called the HbA1C (A1C) will be monitored. This is a simple blood test. It measures your blood sugar control over the last 2 to 3 months. You will receive this test every 3 to 6 months. Learn how to check your blood sugar. Learn the symptoms of low and high blood sugar and how to manage them. Always carry a quick-source of sugar with you in case you have symptoms of low blood sugar. Examples include hard sugar candy or glucose tablets. Make sure others know that you can choke if you eat or drink when you develop serious symptoms of low blood sugar, such as seizures or unconsciousness. They must get medical help at once. Tell your doctor or health care professional if you have high blood sugar. You might need to change the dose of your medicine. If you are sick or exercising more than usual, you might need to change the dose of your  medicine. Do not skip meals. Ask your doctor or health care professional if you should avoid alcohol. Many nonprescription cough and cold products contain sugar or alcohol. These can affect blood sugar. This medicine can make you more sensitive to the sun. Keep out of the sun. If you cannot avoid being in the sun, wear protective clothing and use sunscreen. Do not use sun lamps or tanning beds/booths. Wear a medical ID bracelet or chain, and carry a card that describes your disease and details of your medicine and dosage times. What side effects may I notice from receiving this medicine? Side effects that you should report to your doctor or health care professional as soon as possible: -allergic reactions like skin rash, itching or hives, swelling of the face, lips, or tongue -breathing problems -dark urine -fever, chills, sore throat -signs and symptoms of  low blood sugar such as feeling anxious, confusion, dizziness, increased hunger, unusually weak or tired, sweating, shakiness, cold, irritable, headache, blurred vision, fast heartbeat, loss of consciousness -unusual bleeding or bruising -yellowing of the eyes or skin Side effects that usually do not require medical attention (report to your doctor or health care professional if they continue or are bothersome): -diarrhea -dizziness -headache -heartburn -nausea -stomach gas This list may not describe all possible side effects. Call your doctor for medical advice about side effects. You may report side effects to FDA at 1-800-FDA-1088. Where should I keep my medicine? Keep out of the reach of children. Store at room temperature below 30 degrees C (86 degrees F). Throw away any unused medicine after the expiration date. NOTE: This sheet is a summary. It may not cover all possible information. If you have questions about this medicine, talk to your doctor, pharmacist, or health care provider.  2019 [employer]/Gold Standard (2013-02-16  14:42:46) Sitagliptin oral tablet What is this medicine? SITAGLIPTIN (sit a GLIP tin) helps to treat type 2 diabetes. It helps to control blood sugar. Treatment is combined with diet and exercise. This medicine may be used for other purposes; ask your health care provider or pharmacist if you have questions. COMMON BRAND NAME(S): Januvia What should I tell my health care provider before I take this medicine? They need to know if you have any of these conditions: -diabetic ketoacidosis -kidney disease -pancreatitis -previous swelling of the tongue, face, or lips with difficulty breathing, difficulty swallowing, hoarseness, or tightening of the throat -type 1 diabetes -an unusual or allergic reaction to sitagliptin, other medicines, foods, dyes, or preservatives -pregnant or trying to get pregnant -breast-feeding How should I use this medicine? Take this medicine by mouth with a glass of water. Follow the directions on the prescription label. You can take it with or without food. Do not cut, crush or chew this medicine. Take your dose at the same time each day. Do not take more often than directed. Do not stop taking except on your doctor's advice. A special MedGuide will be given to you by the pharmacist with each prescription and refill. Be sure to read this information carefully each time. Talk to your pediatrician regarding the use of this medicine in children. Special care may be needed. Overdosage: If you think you have taken too much of this medicine contact a poison control center or emergency room at once. NOTE: This medicine is only for you. Do not share this medicine with others. What if I miss a dose? If you miss a dose, take it as soon as you can. If it is almost time for your next dose, take only that dose. Do not take double or extra doses. What may interact with this medicine? Do not take this medicine with any of the following medications: -gatifloxacin This medicine may  also interact with the following medications: -alcohol -digoxin -insulin -sulfonylureas like glimepiride, glipizide, glyburide This list may not describe all possible interactions. Give your health care provider a list of all the medicines, herbs, non-prescription drugs, or dietary supplements you use. Also tell them if you smoke, drink alcohol, or use illegal drugs. Some items may interact with your medicine. What should I watch for while using this medicine? Visit your doctor or health care professional for regular checks on your progress. A test called the HbA1C (A1C) will be monitored. This is a simple blood test. It measures your blood sugar control over the last 2 to 3 months.  You will receive this test every 3 to 6 months. Learn how to check your blood sugar. Learn the symptoms of low and high blood sugar and how to manage them. Always carry a quick-source of sugar with you in case you have symptoms of low blood sugar. Examples include hard sugar candy or glucose tablets. Make sure others know that you can choke if you eat or drink when you develop serious symptoms of low blood sugar, such as seizures or unconsciousness. They must get medical help at once. Tell your doctor or health care professional if you have high blood sugar. You might need to change the dose of your medicine. If you are sick or exercising more than usual, you might need to change the dose of your medicine. Do not skip meals. Ask your doctor or health care professional if you should avoid alcohol. Many nonprescription cough and cold products contain sugar or alcohol. These can affect blood sugar. Wear a medical ID bracelet or chain, and carry a card that describes your disease and details of your medicine and dosage times. What side effects may I notice from receiving this medicine? Side effects that you should report to your doctor or health care professional as soon as possible: -allergic reactions like skin rash,  itching or hives, swelling of the face, lips, or tongue -breathing problems -general ill feeling or flu-like symptoms -joint pain -loss of appetite -redness, blistering, peeling or loosening of the skin, including inside the mouth -signs and symptoms of heart failure like breathing problems, fast, irregular heartbeat, sudden weight gain; swelling of the ankles, feet, hands; unusually weak or tired -signs and symptoms of low blood sugar such as feeling anxious, confusion, dizziness, increased hunger, unusually weak or tired, sweating, shakiness, cold, irritable, headache, blurred vision, fast heartbeat, loss of consciousness -signs and symptoms of muscle injury like dark urine; trouble passing urine or change in the amount of urine; unusually weak or tired; muscle pain; back pain -unusual stomach upset or pain -vomiting Side effects that usually do not require medical attention (report to your doctor or health care professional if they continue or are bothersome): -diarrhea -headache -sore throat -stomach upset -stuffy or runny nose This list may not describe all possible side effects. Call your doctor for medical advice about side effects. You may report side effects to FDA at 1-800-FDA-1088. Where should I keep my medicine? Keep out of the reach of children. Store at room temperature between 15 and 30 degrees C (59 and 86 degrees F). Throw away any unused medicine after the expiration date. NOTE: This sheet is a summary. It may not cover all possible information. If you have questions about this medicine, talk to your doctor, pharmacist, or health care provider.  2019 [employer] Standard (2018-06-08 16:38:43)

## 2019-04-05 NOTE — Progress Notes (Addendum)
Patient [facility]   Established Patient Office Visit  Subjective:  Patient ID: Angela Henderson, female    DOB: [birth year]  Age: 28 y.o. MRN: [id number]  CC:  Chief Complaint  Patient presents with  . Hospitalization Follow-up  . Menstrual Problem    HPI Angela Henderson is a 28 year old female who presents for Follow up today.   Past Medical History:  Diagnosis Date  . DKA, type 2 ([place]) 1st admission 11/02/2015  . Essential hypertension 02/05/2019  . Hyperlipidemia   . Type II diabetes mellitus ([place]) dx'd 09/2015   mother and grandmother are type 1, dad type 2   Current Status: Since her last office visit, she is doing well with no complaints. She denies visual changes, chest pain, cough, shortness of breath, heart palpitations, and falls. She has occasional headaches and dizziness with position changes. Denies severe headaches, confusion, seizures, double vision, and blurred vision, nausea and vomiting. Her most recent normal range of preprandial blood glucose levels have been in the 300's. She has seen low range of 100 and high of 300's since his last office visit. She denies admits to fatigue, frequent urination, blurred vision, excessive hunger, excessive thirst, and weight gain. She denies weight loss, and poor wound healing. She continues to check her feet regularly.   She denies fevers, chills, recent infections, weight loss, and night sweats. No reports of GI problems such as diarrhea, and constipation. She has no reports of blood in stools, dysuria and hematuria. No depression or anxiety reported. She denies pain today.   Past Surgical History:  Procedure Laterality Date  . WISDOM TOOTH EXTRACTION      Family History  Problem Relation Age of Onset  . Diabetes Mother        Type I  . Hypertension Mother   . Diabetes Father   . Hypertension Father   . Diabetes Cousin        Type I  . Cancer Maternal Aunt     Social  History   Socioeconomic History  . Marital status: Single    Spouse name: Not on file  . Number of children: Not on file  . Years of education: Not on file  . Highest education level: Not on file  Occupational History  . Not on file  Social Needs  . Financial resource strain: Not on file  . Food insecurity:    Worry: Not on file    Inability: Not on file  . Transportation needs:    Medical: Not on file    Non-medical: Not on file  Tobacco Use  . Smoking status: Never Smoker  . Smokeless tobacco: Never Used  Substance and Sexual Activity  . Alcohol use: No    Alcohol/week: 0.0 standard drinks    Comment: 11/02/2015 "1-2 drinks on weekend maybe once/month"  . Drug use: No  . Sexual activity: Yes  Lifestyle  . Physical activity:    Days per week: Not on file    Minutes per session: Not on file  . Stress: Not on file  Relationships  . Social connections:    Talks on phone: Not on file    Gets together: Not on file    Attends religious service: Not on file    Active member of club or organization: Not on file    Attends meetings of clubs or organizations: Not on file    Relationship status: Not on file  .  Intimate partner violence:    Fear of current or ex partner: Not on file    Emotionally abused: Not on file    Physically abused: Not on file    Forced sexual activity: Not on file  Other Topics Concern  . Not on file  Social History Narrative  . Not on file    Outpatient Medications Prior to Visit  Medication Sig Dispense Refill  . blood glucose meter kit and supplies Dispense based on patient and insurance preference. Use up to four times daily as directed. (FOR ICD-10 E10.9, E11.9). 1 each 0  . cetirizine (ZYRTEC) 10 MG tablet Take 1 tablet (10 mg total) by mouth daily. 30 tablet 11  . glucose blood test strip Use as instructed 100 each 12  . insulin aspart (NOVOLOG) 100 UNIT/ML FlexPen Inject 10 Units into the skin 3 (three) times daily with meals. 15 mL 6  .  insulin detemir (LEVEMIR) 100 UNIT/ML injection Inject 0.15 mLs (15 Units total) into the skin at bedtime. 10 mL 6  . metFORMIN (GLUCOPHAGE) 500 MG tablet Take 1 tablet (500 mg total) by mouth 2 (two) times daily with a meal. (Patient not taking: Reported on 04/05/2019) 60 tablet 2  . oxymetazoline (AFRIN NASAL SPRAY) 0.05 % nasal spray Place 1 spray into both nostrils 2 (two) times daily. 30 mL 0  . guaiFENesin (MUCINEX) 600 MG 12 hr tablet Take 1 tablet (600 mg total) by mouth 2 (two) times daily. 60 tablet 1   No facility-administered medications prior to visit.     No Known Allergies  ROS Review of Systems  Constitutional: Positive for fatigue.  HENT: Negative.   Eyes: Negative.   Respiratory: Negative.   Cardiovascular: Negative.   Gastrointestinal: Negative.   Endocrine: Positive for polydipsia, polyphagia and polyuria.  Genitourinary: Negative.   Musculoskeletal: Negative.   Skin: Negative.   Allergic/Immunologic: Negative.   Neurological: Positive for dizziness and headaches.  Hematological: Negative.   Psychiatric/Behavioral: Negative.       Objective:    Physical Exam  Constitutional: She is oriented to person, place, and time. She appears well-developed and well-nourished.  HENT:  Head: Normocephalic and atraumatic.  Eyes: Conjunctivae are normal.  Neck: Normal range of motion. Neck supple.  Cardiovascular: Normal rate, regular rhythm, normal heart sounds and intact distal pulses.  Pulmonary/Chest: Effort normal and breath sounds normal.  Abdominal: Soft. Bowel sounds are normal.  Musculoskeletal: Normal range of motion.  Neurological: She is alert and oriented to person, place, and time. She has normal reflexes.  Skin: Skin is warm and dry.  Psychiatric: She has a normal mood and affect. Her behavior is normal. Judgment and thought content normal.  Nursing note and vitals reviewed.   BP (!) 146/90 (BP Location: Left Arm, Patient Position: Sitting, Cuff Size:  Large)   Pulse 70   Temp 98 F (36.7 C) (Oral)   Ht _0  (1.676 m)   Wt 209 lb (94.8 kg)   LMP 03/30/2019   SpO2 100%   BMI 33.73 kg/m  Wt Readings from Last 3 Encounters:  04/05/19 209 lb (94.8 kg)  11/01/18 201 lb (91.2 kg)  09/27/18 201 lb (91.2 kg)     There are no preventive care reminders to display for this patient.  There are no preventive care reminders to display for this patient.  Lab Results  Component Value Date   TSH 2.31 12/16/2016   Lab Results  Component Value Date   WBC 6.3 09/26/2018  HGB 9.8 (L) 09/26/2018   HCT 33.9 (L) 09/26/2018   MCV 81.5 09/26/2018   PLT 328 09/26/2018   Lab Results  Component Value Date   NA 134 (L) 09/26/2018   K 4.0 09/26/2018   CO2 24 09/26/2018   GLUCOSE 344 (H) 09/26/2018   BUN 8 09/26/2018   CREATININE 0.80 09/26/2018   BILITOT 0.5 12/17/2016   ALKPHOS 37 (L) 12/17/2016   AST 26 12/17/2016   ALT 26 12/17/2016   PROT 7.3 12/17/2016   ALBUMIN 3.6 12/17/2016   CALCIUM 9.0 09/26/2018   ANIONGAP 6 09/26/2018   GFR 128.62 10/31/2016   Lab Results  Component Value Date   CHOL 131 12/16/2016   Lab Results  Component Value Date   HDL 39 (L) 12/16/2016   Lab Results  Component Value Date   LDLCALC 65 12/16/2016   Lab Results  Component Value Date   TRIG 134 12/16/2016   Lab Results  Component Value Date   CHOLHDL 3.4 12/16/2016   Lab Results  Component Value Date   HGBA1C 11.7 (A) 04/05/2019      Assessment & Plan:   1. Type 2 diabetes mellitus with hyperglycemia, with long-term current use of insulin (HCC) We will initiate Glucotrol, Januvia, and increase Levimir to 30 units QHS. She will continue to decrease foods/beverages high in sugars and carbs and follow Heart Healthy or DASH diet. Increase physical activity to at least 30 minutes cardio exercise daily.   2. Type 2 diabetes mellitus without complication, without long-term current use of insulin (HCC) - POCT glycosylated hemoglobin (Hb  A1C) - POCT urinalysis dipstick - POCT glucose (manual entry) - ibuprofen (ADVIL) 800 MG tablet; Take 1 tablet (800 mg total) by mouth every 8 (eight) hours as needed.  Dispense: 30 tablet; Refill: 3 - sitaGLIPtin (JANUVIA) 100 MG tablet; Take 1 tablet (100 mg total) by mouth daily.  Dispense: 30 tablet; Refill: 1 - glipiZIDE (GLUCOTROL) 10 MG tablet; Take 1 tablet (10 mg total) by mouth 2 (two) times daily before a meal.  Dispense: 60 tablet; Refill: 3 - Continuous Blood Gluc Receiver (FREESTYLE LIBRE READER) DEVI; 1 Device by Does not apply route 3 (three) times daily between meals as needed.  Dispense: 1 Device; Refill: 0  3. Essential hypertension The current medical regimen is effective; blood pressure is stable at 146/90 today; continue present plan and medications as prescribed. She will continue to decrease high sodium intake, excessive alcohol intake, increase potassium intake, smoking cessation, and increase physical activity of at least 30 minutes of cardio activity daily. She will continue to follow Heart Healthy or DASH diet.  4. Hyperglycemia - ibuprofen (ADVIL) 800 MG tablet; Take 1 tablet (800 mg total) by mouth every 8 (eight) hours as needed.  Dispense: 30 tablet; Refill: 3 - sitaGLIPtin (JANUVIA) 100 MG tablet; Take 1 tablet (100 mg total) by mouth daily.  Dispense: 30 tablet; Refill: 1 - glipiZIDE (GLUCOTROL) 10 MG tablet; Take 1 tablet (10 mg total) by mouth 2 (two) times daily before a meal.  Dispense: 60 tablet; Refill: 3 - Continuous Blood Gluc Receiver (FREESTYLE LIBRE READER) DEVI; 1 Device by Does not apply route 3 (three) times daily between meals as needed.  Dispense: 1 Device; Refill: 0  5. Elevated glucose We will refer her to Endocrinologist, Dr. Nino Glow office at this time.   6. Abnormal urinalysis Results are pending.  - Urine Culture  7. Follow up She will follow up in 1 month.   Meds ordered  this encounter  Medications  . ibuprofen (ADVIL) 800 MG  tablet    Sig: Take 1 tablet (800 mg total) by mouth every 8 (eight) hours as needed.    Dispense:  30 tablet    Refill:  3  . sitaGLIPtin (JANUVIA) 100 MG tablet    Sig: Take 1 tablet (100 mg total) by mouth daily.    Dispense:  30 tablet    Refill:  1  . glipiZIDE (GLUCOTROL) 10 MG tablet    Sig: Take 1 tablet (10 mg total) by mouth 2 (two) times daily before a meal.    Dispense:  60 tablet    Refill:  3  . Continuous Blood Gluc Receiver (FREESTYLE LIBRE READER) DEVI    Sig: 1 Device by Does not apply route 3 (three) times daily between meals as needed.    Dispense:  1 Device    Refill:  0  . insulin detemir (LEVEMIR) 100 UNIT/ML injection    Sig: Inject 0.3 mLs (30 Units total) into the skin daily.    Dispense:  10 mL    Refill:  11    Orders Placed This Encounter  Procedures  . Urine Culture  . Ambulatory referral to Endocrinology  . POCT glycosylated hemoglobin (Hb A1C)  . POCT urinalysis dipstick  . POCT glucose (manual entry)     Referral Orders     Ambulatory referral to Endocrinology  Kathe Becton,  MSN, FNP-C Patient [facility] [place], [place] [address] [phone]   Problem List Items Addressed This Visit      Cardiovascular and Mediastinum   Essential hypertension     Endocrine   Type II diabetes mellitus ([place]) - Primary   Relevant Medications   ibuprofen (ADVIL) 800 MG tablet   sitaGLIPtin (JANUVIA) 100 MG tablet   glipiZIDE (GLUCOTROL) 10 MG tablet   Continuous Blood Gluc Receiver (FREESTYLE LIBRE READER) DEVI   insulin detemir (LEVEMIR) 100 UNIT/ML injection   Other Relevant Orders   Ambulatory referral to Endocrinology   POCT glycosylated hemoglobin (Hb A1C) (Completed)   POCT urinalysis dipstick (Completed)   POCT glucose (manual entry) (Completed)     Other   Elevated glucose   Relevant Medications   insulin detemir (LEVEMIR) 100 UNIT/ML injection   Other Relevant Orders   Ambulatory  referral to Endocrinology   Hyperglycemia   Relevant Medications   ibuprofen (ADVIL) 800 MG tablet   sitaGLIPtin (JANUVIA) 100 MG tablet   glipiZIDE (GLUCOTROL) 10 MG tablet   Continuous Blood Gluc Receiver (FREESTYLE LIBRE READER) DEVI   insulin detemir (LEVEMIR) 100 UNIT/ML injection   Other Relevant Orders   Ambulatory referral to Endocrinology    Other Visit Diagnoses    Abnormal urinalysis       Relevant Orders   Urine Culture   Follow up          Meds ordered this encounter  Medications  . ibuprofen (ADVIL) 800 MG tablet    Sig: Take 1 tablet (800 mg total) by mouth every 8 (eight) hours as needed.    Dispense:  30 tablet    Refill:  3  . sitaGLIPtin (JANUVIA) 100 MG tablet    Sig: Take 1 tablet (100 mg total) by mouth daily.    Dispense:  30 tablet    Refill:  1  . glipiZIDE (GLUCOTROL) 10 MG tablet    Sig: Take 1 tablet (10 mg total) by mouth 2 (two) times daily  before a meal.    Dispense:  60 tablet    Refill:  3  . Continuous Blood Gluc Receiver (FREESTYLE LIBRE READER) DEVI    Sig: 1 Device by Does not apply route 3 (three) times daily between meals as needed.    Dispense:  1 Device    Refill:  0  . insulin detemir (LEVEMIR) 100 UNIT/ML injection    Sig: Inject 0.3 mLs (30 Units total) into the skin daily.    Dispense:  10 mL    Refill:  11    Follow-up: Return in about 1 month (around 05/06/2019).    Azzie Glatter, FNP

## 2019-04-06 ENCOUNTER — Telehealth: Payer: Self-pay

## 2019-04-06 MED ORDER — FREESTYLE LIBRE 14 DAY SENSOR MISC: 1.0000 [IU]/m2/d | Freq: Three times a day (TID) | 11 refills | Status: AC | PRN

## 2019-04-06 NOTE — Telephone Encounter (Signed)
Patient would like a referral to Endocrinology to Dr. Romero Belling office.

## 2019-04-06 NOTE — Addendum Note (Signed)
Addended by: Kallie Locks on: 04/06/2019 11:59 AM   Modules accepted: Orders

## 2019-04-07 LAB — URINE CULTURE

## 2019-04-07 NOTE — Telephone Encounter (Signed)
Patient has been notified that referral has been

## 2019-04-20 ENCOUNTER — Encounter (HOSPITAL_COMMUNITY): Payer: Self-pay

## 2019-05-05 ENCOUNTER — Telehealth: Payer: Self-pay

## 2019-05-05 NOTE — Telephone Encounter (Signed)
Left a vm for patient to callback and do screening 

## 2019-05-06 ENCOUNTER — Ambulatory Visit: Payer: BLUE CROSS/BLUE SHIELD | Admitting: Family Medicine

## 2019-06-20 ENCOUNTER — Ambulatory Visit (INDEPENDENT_AMBULATORY_CARE_PROVIDER_SITE_OTHER): Payer: BC Managed Care – PPO | Admitting: Family Medicine

## 2019-06-20 ENCOUNTER — Encounter: Payer: Self-pay | Admitting: Family Medicine

## 2019-06-20 ENCOUNTER — Other Ambulatory Visit: Payer: Self-pay

## 2019-06-20 VITALS — BP 126/88 | HR 68 | Temp 98.0°F | Ht 66.0 in | Wt 205.0 lb

## 2019-06-20 DIAGNOSIS — Z09 Encounter for follow-up examination after completed treatment for conditions other than malignant neoplasm: Secondary | ICD-10-CM | POA: Diagnosis not present

## 2019-06-20 DIAGNOSIS — Z3009 Encounter for other general counseling and advice on contraception: Secondary | ICD-10-CM | POA: Diagnosis not present

## 2019-06-20 DIAGNOSIS — Z3046 Encounter for surveillance of implantable subdermal contraceptive: Secondary | ICD-10-CM

## 2019-06-20 MED ORDER — LO LOESTRIN FE 1 MG-10 MCG / 10 MCG PO TABS: 1.0000 | ORAL_TABLET | Freq: Every day | ORAL | 11 refills | Status: DC

## 2019-06-20 NOTE — Patient Instructions (Signed)
Contraception Choices Contraception, also called birth control, means things to use or ways to try not to get pregnant. Hormonal birth control This kind of birth control uses hormones. Here are some types of hormonal birth control:  A tube that is put under skin of the arm (implant). The tube can stay in for as long as 3 years.  Shots to get every 3 months (injections).  Pills to take every day (birth control pills).  A patch to change 1 time each week for 3 weeks (birth control patch). After that, the patch is taken off for 1 week.  A ring to put in the vagina. The ring is left in for 3 weeks. Then it is taken out of the vagina for 1 week. Then a new ring is put in.  Pills to take after unprotected sex (emergency birth control pills). Barrier birth control Here are some types of barrier birth control:  A thin covering that is put on the penis before sex (female condom). The covering is thrown away after sex.  A soft, loose covering that is put in the vagina before sex (female condom). The covering is thrown away after sex.  A rubber bowl that sits over the cervix (diaphragm). The bowl must be made for you. The bowl is put into the vagina before sex. The bowl is left in for 6-8 hours after sex. It is taken out within 24 hours.  A small, soft cup that fits over the cervix (cervical cap). The cup must be made for you. The cup can be left in for 6-8 hours after sex. It is taken out within 48 hours.  A sponge that is put into the vagina before sex. It must be left in for at least 6 hours after sex. It must be taken out within 30 hours. Then it is thrown away.  A chemical that kills or stops sperm from getting into the uterus (spermicide). It may be a pill, cream, jelly, or foam to put in the vagina. The chemical should be used at least 10-15 minutes before sex. IUD (intrauterine) birth control An IUD is a small, T-shaped piece of plastic. It is put inside the uterus. There are two kinds:   Hormone IUD. This kind can stay in for 3-5 years.  Copper IUD. This kind can stay in for 10 years. Permanent birth control Here are some types of permanent birth control:  Surgery to block the fallopian tubes.  Having an insert put into each fallopian tube.  Surgery to tie off the tubes that carry sperm (vasectomy). Natural planning birth control Here are some types of natural planning birth control:  Not having sex on the days the woman could get pregnant.  Using a calendar: ? To keep track of the length of each period. ? To find out what days pregnancy can happen. ? To plan to not have sex on days when pregnancy can happen.  Watching for symptoms of ovulation and not having sex during ovulation. One way the woman can check for ovulation is to check her temperature.  Waiting to have sex until after ovulation. Summary  Contraception, also called birth control, means things to use or ways to try not to get pregnant.  Hormonal methods of birth control include implants, injections, pills, patches, vaginal rings, and emergency birth control pills.  Barrier methods of birth control can include female condoms, female condoms, diaphragms, cervical caps, sponges, and spermicides.  There are two types of IUD (intrauterine device) birth control.  An IUD can be put in a woman's uterus to prevent pregnancy for 3-5 years.  Permanent sterilization can be done through a procedure for males, females, or both.  Natural planning methods involve not having sex on the days when the woman could get pregnant. This information is not intended to replace advice given to you by your health care provider. Make sure you discuss any questions you have with your health care provider. Document Released: 08/31/2009 Document Revised: 02/23/2019 Document Reviewed: 11/13/2016 Elsevier Patient Education  2020 Elsevier Inc. Ethinyl Estradiol; Norethindrone Acetate; Ferrous fumarate tablets or capsules What is  this medicine? ETHINYL ESTRADIOL; NORETHINDRONE ACETATE; FERROUS FUMARATE (ETH in il es tra DYE ole; nor eth IN drone AS e tate; FER us FUE ma rate) is an oral contraceptive. The products combine two types of female hormones, an estrogen and a progestin. They are used to prevent ovulation and pregnancy. Some products are also used to treat acne in females. This medicine may be used for other purposes; ask your health care provider or pharmacist if you have questions. COMMON BRAND NAME(S): Aurovela [address]24 Fe 1/20, Aurovela Fe, Blisovi [address]24 Fe, [address], Estrostep Fe, Gildess [address]24 Fe, [address] 1.5/30, Gildess Fe 1/20, Hailey 24 Fe, Junel Fe 1.5/30, Junel Fe 1/20, Junel Fe 24, Larin Fe, Lo Loestrin Fe, Loestrin 24 Fe, Loestrin FE 1.5/30, Loestrin FE 1/20, Lomedia 24 Fe, Microgestin 24 Fe, Microgestin Fe 1.5/30, Microgestin Fe 1/20, Tarina 24 Fe, Tarina Fe 1/20, Taytulla, Tilia Fe, Tri-Legest Fe What should I tell my health care provider before I take this medicine? They need to know if you have any of these conditions:  abnormal vaginal bleeding  blood vessel disease  breast, cervical, endometrial, ovarian, liver, or uterine cancer  diabetes  gallbladder disease  heart disease or recent heart attack  high blood pressure  high cholesterol  history of blood clots  kidney disease  liver disease  migraine headaches  smoke tobacco  stroke  systemic lupus erythematosus (SLE)  an unusual or allergic reaction to estrogens, progestins, other medicines, foods, dyes, or preservatives  pregnant or trying to get pregnant  breast-feeding How should I use this medicine? Take this medicine by mouth. To reduce nausea, this medicine may be taken with food. Follow the directions on the prescription label. Take this medicine at the same time each day and in the order directed on the package. Do not take your medicine more often than directed. A patient package insert for the product will be given  with each prescription and refill. Read this sheet carefully each time. The sheet may change frequently. Contact your pediatrician regarding the use of this medicine in children. Special care may be needed. This medicine has been used in female children who have started having menstrual periods. Overdosage: If you think you have taken too much of this medicine contact a poison control center or emergency room at once. NOTE: This medicine is only for you. Do not share this medicine with others. What if I miss a dose? If you miss a dose, refer to the patient information sheet you received with your medicine for direction. If you miss more than one pill, this medicine may not be as effective and you may need to use another form of birth control. What may interact with this medicine? Do not take this medicine with the following medication:  dasabuvir; ombitasvir; paritaprevir; ritonavir  ombitasvir; paritaprevir; ritonavir This medicine may also interact with the following medications:  acetaminophen  antibiotics or  medicines for infections, especially rifampin, rifabutin, rifapentine, and griseofulvin, and possibly penicillins or tetracyclines  aprepitant  ascorbic acid (vitamin C)  atorvastatin  barbiturate medicines, such as phenobarbital  bosentan  carbamazepine  caffeine  clofibrate  cyclosporine  dantrolene  doxercalciferol  felbamate  grapefruit juice  hydrocortisone  medicines for anxiety or sleeping problems, such as diazepam or temazepam  medicines for diabetes, including pioglitazone  mineral oil  modafinil  mycophenolate  nefazodone  oxcarbazepine  phenytoin  prednisolone  ritonavir or other medicines for HIV infection or AIDS  rosuvastatin  selegiline  soy isoflavones supplements  St. John's wort  tamoxifen or raloxifene  theophylline  thyroid hormones  topiramate  warfarin This list may not describe all possible  interactions. Give your health care provider a list of all the medicines, herbs, non-prescription drugs, or dietary supplements you use. Also tell them if you smoke, drink alcohol, or use illegal drugs. Some items may interact with your medicine. What should I watch for while using this medicine? Visit your doctor or health care professional for regular checks on your progress. You will need a regular breast and pelvic exam and Pap smear while on this medicine. Use an additional method of contraception during the first cycle that you take these tablets. If you have any reason to think you are pregnant, stop taking this medicine right away and contact your doctor or health care professional. If you are taking this medicine for hormone related problems, it may take several cycles of use to see improvement in your condition. Smoking increases the risk of getting a blood clot or having a stroke while you are taking birth control pills, especially if you are more than 30 years old. You are strongly advised not to smoke. This medicine can make your body retain fluid, making your fingers, hands, or ankles swell. Your blood pressure can go up. Contact your doctor or health care professional if you feel you are retaining fluid. This medicine can make you more sensitive to the sun. Keep out of the sun. If you cannot avoid being in the sun, wear protective clothing and use sunscreen. Do not use sun lamps or tanning beds/booths. If you wear contact lenses and notice visual changes, or if the lenses begin to feel uncomfortable, consult your eye care specialist. In some women, tenderness, swelling, or minor bleeding of the gums may occur. Notify your dentist if this happens. Brushing and flossing your teeth regularly may help limit this. See your dentist regularly and inform your dentist of the medicines you are taking. If you are going to have elective surgery, you may need to stop taking this medicine before the  surgery. Consult your health care professional for advice. This medicine does not protect you against HIV infection (AIDS) or any other sexually transmitted diseases. What side effects may I notice from receiving this medicine? Side effects that you should report to your doctor or health care professional as soon as possible:  allergic reactions like skin rash, itching or hives, swelling of the face, lips, or tongue  breast tissue changes or discharge  changes in vaginal bleeding during your period or between your periods  changes in vision  chest pain  confusion  coughing up blood  dizziness  feeling faint or lightheaded  headaches or migraines  leg, arm or groin pain  loss of balance or coordination  severe or sudden headaches  stomach pain (severe)  sudden shortness of breath  sudden numbness or weakness of the face,  arm or leg  symptoms of vaginal infection like itching, irritation or unusual discharge  tenderness in the upper abdomen  trouble speaking or understanding  vomiting  yellowing of the eyes or skin Side effects that usually do not require medical attention (report to your doctor or health care professional if they continue or are bothersome):  breakthrough bleeding and spotting that continues beyond the 3 initial cycles of pills  breast tenderness  mood changes, anxiety, depression, frustration, anger, or emotional outbursts  increased sensitivity to sun or ultraviolet light  nausea  skin rash, acne, or brown spots on the skin  weight gain (slight) This list may not describe all possible side effects. Call your doctor for medical advice about side effects. You may report side effects to FDA at 1-800-FDA-1088. Where should I keep my medicine? Keep out of the reach of children. Store at room temperature between 15 and 30 degrees C (59 and 86 degrees F). Throw away any unused medicine after the expiration date. NOTE: This sheet is a  summary. It may not cover all possible information. If you have questions about this medicine, talk to your doctor, pharmacist, or health care provider.  2020 Elsevier/Gold Standard (2016-07-14 08:04:41)

## 2019-06-20 NOTE — Progress Notes (Signed)
[facility]    Established Patient Office Visit  Subjective:  Patient ID: Angela Henderson, female    DOB: 08-Jan-1991  Age: 28 y.o. MRN: [id number]  CC:  Chief Complaint  Patient presents with  . Nexplanon removal    HPI Angela Henderson is a 28 year old female who presents for Nexplanon Removal today.   Past Medical History:  Diagnosis Date  . DKA, type 2 ([place]) 1st admission 11/02/2015  . Essential hypertension 02/05/2019  . Hyperlipidemia   . Type II diabetes mellitus ([place]) dx'd 09/2015   mother and grandmother are type 1, dad type 2   Current Status: Since her last office visit, she is doing well with no complaints. She is here today for removal of Neplanon. She had placed about 1 year ago and does not like the break through bleeding sided effects.   She denies fevers, chills, fatigue, recent infections, weight loss, and night sweats. She has not had any headaches, visual changes, dizziness, and falls. No chest pain, heart palpitations, cough and shortness of breath reported. No reports of GI problems such as nausea, vomiting, diarrhea, and constipation. She has no reports of blood in stools, dysuria and hematuria. No depression or anxiety reported today.She denies pain today.   Past Surgical History:  Procedure Laterality Date  . WISDOM TOOTH EXTRACTION      Family History  Problem Relation Age of Onset  . Diabetes Mother        Type I  . Hypertension Mother   . Diabetes Father   . Hypertension Father   . Diabetes Cousin        Type I  . Cancer Maternal Aunt     Social History   Socioeconomic History  . Marital status: Single    Spouse name: Not on file  . Number of children: Not on file  . Years of education: Not on file  . Highest education level: Not on file  Occupational History  . Not on file  Social Needs  . Financial resource strain: Not on file  . Food insecurity    Worry: Not on file   Inability: Not on file  . Transportation needs    Medical: Not on file    Non-medical: Not on file  Tobacco Use  . Smoking status: Never Smoker  . Smokeless tobacco: Never Used  Substance and Sexual Activity  . Alcohol use: No    Alcohol/week: 0.0 standard drinks    Comment: 11/02/2015 "1-2 drinks on weekend maybe once/month"  . Drug use: No  . Sexual activity: Yes  Lifestyle  . Physical activity    Days per week: Not on file    Minutes per session: Not on file  . Stress: Not on file  Relationships  . Social Herbalist on phone: Not on file    Gets together: Not on file    Attends religious service: Not on file    Active member of club or organization: Not on file    Attends meetings of clubs or organizations: Not on file    Relationship status: Not on file  . Intimate partner violence    Fear of current or ex partner: Not on file    Emotionally abused: Not on file    Physically abused: Not on file    Forced sexual activity: Not on file  Other Topics Concern  . Not on file  Social History Narrative  .  Not on file    Outpatient Medications Prior to Visit  Medication Sig Dispense Refill  . blood glucose meter kit and supplies Dispense based on patient and insurance preference. Use up to four times daily as directed. (FOR ICD-10 E10.9, E11.9). 1 each 0  . cetirizine (ZYRTEC) 10 MG tablet Take 1 tablet (10 mg total) by mouth daily. 30 tablet 11  . Continuous Blood Gluc Receiver (FREESTYLE LIBRE READER) DEVI 1 Device by Does not apply route 3 (three) times daily between meals as needed. 1 Device 0  . Continuous Blood Gluc Sensor (FREESTYLE LIBRE 14 DAY SENSOR) MISC 1 Units/m2/day by Does not apply route 3 (three) times daily as needed. 2 each 11  . glucose blood test strip Use as instructed 100 each 12  . ibuprofen (ADVIL) 800 MG tablet Take 1 tablet (800 mg total) by mouth every 8 (eight) hours as needed. 30 tablet 3  . oxymetazoline (AFRIN NASAL SPRAY) 0.05 % nasal  spray Place 1 spray into both nostrils 2 (two) times daily. 30 mL 0  . Semaglutide (OZEMPIC, 1 MG/DOSE, [place]) Inject 1 mg into the skin once a week.    Marland Kitchen glipiZIDE (GLUCOTROL) 10 MG tablet Take 1 tablet (10 mg total) by mouth 2 (two) times daily before a meal. (Patient not taking: Reported on 06/20/2019) 60 tablet 3  . insulin aspart (NOVOLOG) 100 UNIT/ML FlexPen Inject 10 Units into the skin 3 (three) times daily with meals. (Patient not taking: Reported on 06/20/2019) 15 mL 6  . insulin detemir (LEVEMIR) 100 UNIT/ML injection Inject 0.3 mLs (30 Units total) into the skin daily. (Patient not taking: Reported on 06/20/2019) 10 mL 11  . metFORMIN (GLUCOPHAGE) 500 MG tablet Take 1 tablet (500 mg total) by mouth 2 (two) times daily with a meal. (Patient not taking: Reported on 04/05/2019) 60 tablet 2  . sitaGLIPtin (JANUVIA) 100 MG tablet Take 1 tablet (100 mg total) by mouth daily. (Patient not taking: Reported on 06/20/2019) 30 tablet 1   No facility-administered medications prior to visit.     No Known Allergies  ROS Review of Systems  Constitutional: Negative.   HENT: Negative.   Eyes: Negative.   Respiratory: Negative.   Cardiovascular: Negative.   Gastrointestinal: Negative.   Endocrine: Negative.   Genitourinary: Negative.   Musculoskeletal: Negative.   Skin: Negative.   Allergic/Immunologic: Negative.   Neurological: Negative.   Hematological: Negative.   Psychiatric/Behavioral: Negative.       Objective:    Physical Exam  Constitutional: She is oriented to person, place, and time. She appears well-developed and well-nourished.  HENT:  Head: Normocephalic and atraumatic.  Eyes: Conjunctivae are normal.  Neck: Normal range of motion. Neck supple.  Cardiovascular: Normal rate, regular rhythm, normal heart sounds and intact distal pulses.  Pulmonary/Chest: Effort normal and breath sounds normal.  Abdominal: Soft. Bowel sounds are normal.  Musculoskeletal: Normal range of motion.   Neurological: She is alert and oriented to person, place, and time. She has normal reflexes.  Skin: Skin is warm and dry.  Psychiatric: She has a normal mood and affect. Her behavior is normal. Judgment and thought content normal.  Nursing note and vitals reviewed.   BP 126/88 (BP Location: Left Arm, Patient Position: Sitting, Cuff Size: Large)   Pulse 68   Temp 98 F (36.7 C) (Oral)   Ht _0  (1.676 m)   Wt 205 lb (93 kg)   LMP 04/05/2019   SpO2 100%   BMI 33.09 kg/m  Wt Readings from Last 3 Encounters:  06/20/19 205 lb (93 kg)  04/05/19 209 lb (94.8 kg)  11/01/18 201 lb (91.2 kg)     Health Maintenance Due  Topic Date Due  . INFLUENZA VACCINE  06/18/2019    There are no preventive care reminders to display for this patient.  Lab Results  Component Value Date   TSH 2.31 12/16/2016   Lab Results  Component Value Date   WBC 6.3 09/26/2018   HGB 9.8 (L) 09/26/2018   HCT 33.9 (L) 09/26/2018   MCV 81.5 09/26/2018   PLT 328 09/26/2018   Lab Results  Component Value Date   NA 134 (L) 09/26/2018   K 4.0 09/26/2018   CO2 24 09/26/2018   GLUCOSE 344 (H) 09/26/2018   BUN 8 09/26/2018   CREATININE 0.80 09/26/2018   BILITOT 0.5 12/17/2016   ALKPHOS 37 (L) 12/17/2016   AST 26 12/17/2016   ALT 26 12/17/2016   PROT 7.3 12/17/2016   ALBUMIN 3.6 12/17/2016   CALCIUM 9.0 09/26/2018   ANIONGAP 6 09/26/2018   GFR 128.62 10/31/2016   Lab Results  Component Value Date   CHOL 131 12/16/2016   Lab Results  Component Value Date   HDL 39 (L) 12/16/2016   Lab Results  Component Value Date   LDLCALC 65 12/16/2016   Lab Results  Component Value Date   TRIG 134 12/16/2016   Lab Results  Component Value Date   CHOLHDL 3.4 12/16/2016   Lab Results  Component Value Date   HGBA1C 11.7 (A) 04/05/2019      Assessment & Plan:   1. Nexplanon removal Implant removed successfully today. Dressing site is CD&I, to be removed in 24 hours. No signs or symptoms of  infection noted. Advised to cover site with gauze or Band-Aid while at work and outside. Patient to contact office if symptoms occur. Patient verbalized understanding.   2. Birth control counseling She will begin oral birth control today.  - Norethindrone-Ethinyl Estradiol-Fe Biphas (LO LOESTRIN FE) 1 MG-10 MCG / 10 MCG tablet; Take 1 tablet by mouth daily.  Dispense: 1 Package; Refill: 11  3. Follow up She will follow up in 6 months.   Meds ordered this encounter  Medications  . Norethindrone-Ethinyl Estradiol-Fe Biphas (LO LOESTRIN FE) 1 MG-10 MCG / 10 MCG tablet    Sig: Take 1 tablet by mouth daily.    Dispense:  1 Package    Refill:  11    No orders of the defined types were placed in this encounter.   Referral Orders  No referral(s) requested today    Kathe Becton,  MSN, FNP-BC [place], [place] [address] [phone]- fax  Problem List Items Addressed This Visit    None    Visit Diagnoses    Nexplanon removal    -  Primary   Birth control counseling       Relevant Medications   Norethindrone-Ethinyl Estradiol-Fe Biphas (LO LOESTRIN FE) 1 MG-10 MCG / 10 MCG tablet   Follow up          Meds ordered this encounter  Medications  . Norethindrone-Ethinyl Estradiol-Fe Biphas (LO LOESTRIN FE) 1 MG-10 MCG / 10 MCG tablet    Sig: Take 1 tablet by mouth daily.    Dispense:  1 Package    Refill:  11    Follow-up: Return in about 6 months (around 12/21/2019).  Azzie Glatter, FNP

## 2019-08-08 ENCOUNTER — Other Ambulatory Visit: Payer: Self-pay | Admitting: Family Medicine

## 2019-08-08 DIAGNOSIS — Z3009 Encounter for other general counseling and advice on contraception: Secondary | ICD-10-CM

## 2019-11-22 DIAGNOSIS — Z20828 Contact with and (suspected) exposure to other viral communicable diseases: Secondary | ICD-10-CM | POA: Diagnosis not present

## 2019-11-22 DIAGNOSIS — Z6831 Body mass index (BMI) 31.0-31.9, adult: Secondary | ICD-10-CM | POA: Diagnosis not present

## 2019-12-01 ENCOUNTER — Ambulatory Visit (HOSPITAL_COMMUNITY)
Admission: EM | Admit: 2019-12-01 | Discharge: 2019-12-01 | Disposition: A | Discharge status: Home / Self Care | Payer: No Typology Code available for payment source

## 2019-12-01 ENCOUNTER — Other Ambulatory Visit: Payer: Self-pay

## 2019-12-01 DIAGNOSIS — Z20822 Contact with and (suspected) exposure to covid-19: Secondary | ICD-10-CM | POA: Diagnosis not present

## 2019-12-13 DIAGNOSIS — E1165 Type 2 diabetes mellitus with hyperglycemia: Secondary | ICD-10-CM | POA: Diagnosis not present

## 2019-12-13 DIAGNOSIS — E559 Vitamin D deficiency, unspecified: Secondary | ICD-10-CM | POA: Diagnosis not present

## 2019-12-21 ENCOUNTER — Encounter: Payer: Self-pay | Admitting: Family Medicine

## 2019-12-21 ENCOUNTER — Other Ambulatory Visit: Payer: Self-pay

## 2019-12-21 ENCOUNTER — Ambulatory Visit (INDEPENDENT_AMBULATORY_CARE_PROVIDER_SITE_OTHER): Payer: BC Managed Care – PPO | Admitting: Family Medicine

## 2019-12-21 VITALS — BP 123/76 | HR 71 | Temp 98.3°F | Ht 66.0 in | Wt 193.2 lb

## 2019-12-21 DIAGNOSIS — T148XXA Other injury of unspecified body region, initial encounter: Secondary | ICD-10-CM

## 2019-12-21 DIAGNOSIS — R739 Hyperglycemia, unspecified: Secondary | ICD-10-CM

## 2019-12-21 DIAGNOSIS — Z3009 Encounter for other general counseling and advice on contraception: Secondary | ICD-10-CM

## 2019-12-21 DIAGNOSIS — I10 Essential (primary) hypertension: Secondary | ICD-10-CM | POA: Diagnosis not present

## 2019-12-21 DIAGNOSIS — Z09 Encounter for follow-up examination after completed treatment for conditions other than malignant neoplasm: Secondary | ICD-10-CM

## 2019-12-21 DIAGNOSIS — R7309 Other abnormal glucose: Secondary | ICD-10-CM | POA: Diagnosis not present

## 2019-12-21 DIAGNOSIS — R634 Abnormal weight loss: Secondary | ICD-10-CM

## 2019-12-21 DIAGNOSIS — E119 Type 2 diabetes mellitus without complications: Secondary | ICD-10-CM | POA: Diagnosis not present

## 2019-12-21 LAB — POCT URINALYSIS DIPSTICK
Bilirubin, UA: NEGATIVE
Blood, UA: NEGATIVE
Glucose, UA: NEGATIVE
Ketones, UA: NEGATIVE
Leukocytes, UA: NEGATIVE
Nitrite, UA: NEGATIVE
Protein, UA: NEGATIVE
Spec Grav, UA: >=1.03 — AB (ref 1.010–1.025)
Urobilinogen, UA: 0.2 E.U./dL
pH, UA: 5.5 (ref 5.0–8.0)

## 2019-12-21 LAB — POCT GLYCOSYLATED HEMOGLOBIN (HGB A1C): Hemoglobin A1C: 5.8 % — AB (ref 4.0–5.6)

## 2019-12-21 LAB — GLUCOSE, POCT (MANUAL RESULT ENTRY): POC Glucose: 126 mg/dl — AB (ref 70–99)

## 2019-12-21 MED ORDER — IBUPROFEN 800 MG PO TABS: 800.0000 mg | ORAL_TABLET | Freq: Three times a day (TID) | ORAL | 3 refills | Status: DC | PRN

## 2019-12-21 NOTE — Progress Notes (Signed)
[facility]   Established Patient Office Visit  Subjective:  Patient ID: Angela [facility], female    DOB: 02-15-1991  Age: 29 y.o. MRN: [id number]  CC:  Chief Complaint  Patient presents with  . Follow-up    DM    HPI [name] is a 29 year old female who presents for Follow Up today.  Past Medical History:  Diagnosis Date  . DKA, type 2 ([facility]) 1st admission 11/02/2015  . Essential hypertension 02/05/2019  . Hyperlipidemia   . Type II diabetes mellitus ([place]) dx'd 09/2015   mother and grandmother are type 1, dad type 2   Current Status: Since her last office visit, she is doing well with no complaints. She has had an office visit with GYN for Vulva Lesion on 10/31/2019. She states that she received treatment and issue is now resolved. She continues to follow up with Endocrinology as needed. Her most recent normal range of preprandial blood glucose levels have been between 95-97. She has seen low range of 60s and high of 160s since his last office visit. She denies fatigue, frequent urination, blurred vision, excessive hunger, excessive thirst, weight gain, weight loss, and poor wound healing. She continues to check her feet regularly. Patient thinks that she has discontinued all previously prescribed Diabetic medications and is not only using Semaglutide (Oxempic) Injections. She denies visual changes, chest pain, cough, shortness of breath, heart palpitations, and falls. She has occasional headaches and dizziness with position changes. Denies severe headaches, confusion, seizures, double vision, and blurred vision, nausea and vomiting. She has had a recent injury to right leg and upper back pain. Her anxiety is mlid today. She denies suicidal ideations, homicidal ideations, or auditory hallucinations. She is currently not on birth control at this time. She denies fevers, chills, recent infections, weight loss, and night sweats. No  reports of GI problems such as nausea, vomiting, diarrhea, and constipation. She has no reports of blood in stools, dysuria and hematuria. She denies pain today.   Past Surgical History:  Procedure Laterality Date  . WISDOM TOOTH EXTRACTION      Family History  Problem Relation Age of Onset  . Diabetes Mother        Type I  . Hypertension Mother   . Diabetes Father   . Hypertension Father   . Diabetes Cousin        Type I  . Cancer Maternal Aunt     Social History   Socioeconomic History  . Marital status: Single    Spouse name: Not on file  . Number of children: Not on file  . Years of education: Not on file  . Highest education level: Not on file  Occupational History  . Not on file  Tobacco Use  . Smoking status: Never Smoker  . Smokeless tobacco: Never Used  Substance and Sexual Activity  . Alcohol use: No    Alcohol/week: 0.0 standard drinks    Comment: 11/02/2015 "1-2 drinks on weekend maybe once/month"  . Drug use: No  . Sexual activity: Yes  Other Topics Concern  . Not on file  Social History Narrative  . Not on file   Social Determinants of Health   Financial Resource Strain:   . Difficulty of Paying Living Expenses: Not on file  Food Insecurity:   . Worried About Charity fundraiser in the Last Year: Not on file  . Ran Out of Food in the  Last Year: Not on file  Transportation Needs:   . Lack of Transportation (Medical): Not on file  . Lack of Transportation (Non-Medical): Not on file  Physical Activity:   . Days of Exercise per Week: Not on file  . Minutes of Exercise per Session: Not on file  Stress:   . Feeling of Stress : Not on file  Social Connections:   . Frequency of Communication with Friends and Family: Not on file  . Frequency of Social Gatherings with Friends and Family: Not on file  . Attends Religious Services: Not on file  . Active Member of Clubs or Organizations: Not on file  . Attends Archivist Meetings: Not on file    . Marital Status: Not on file  Intimate Partner Violence:   . Fear of Current or Ex-Partner: Not on file  . Emotionally Abused: Not on file  . Physically Abused: Not on file  . Sexually Abused: Not on file    Outpatient Medications Prior to Visit  Medication Sig Dispense Refill  . blood glucose meter kit and supplies Dispense based on patient and insurance preference. Use up to four times daily as directed. (FOR ICD-10 E10.9, E11.9). 1 each 0  . cetirizine (ZYRTEC) 10 MG tablet Take 1 tablet (10 mg total) by mouth daily. 30 tablet 11  . Continuous Blood Gluc Receiver (FREESTYLE LIBRE READER) DEVI 1 Device by Does not apply route 3 (three) times daily between meals as needed. 1 Device 0  . Continuous Blood Gluc Sensor (FREESTYLE LIBRE 14 DAY SENSOR) MISC 1 Units/m2/day by Does not apply route 3 (three) times daily as needed. 2 each 11  . glucose blood test strip Use as instructed 100 each 12  . oxymetazoline (AFRIN NASAL SPRAY) 0.05 % nasal spray Place 1 spray into both nostrils 2 (two) times daily. 30 mL 0  . Semaglutide (OZEMPIC, 1 MG/DOSE, [place]) Inject 1 mg into the skin once a week.    Marland Kitchen glipiZIDE (GLUCOTROL) 10 MG tablet Take 1 tablet (10 mg total) by mouth 2 (two) times daily before a meal. (Patient not taking: Reported on 06/20/2019) 60 tablet 3  . ibuprofen (ADVIL) 800 MG tablet Take 1 tablet (800 mg total) by mouth every 8 (eight) hours as needed. 30 tablet 3  . insulin aspart (NOVOLOG) 100 UNIT/ML FlexPen Inject 10 Units into the skin 3 (three) times daily with meals. (Patient not taking: Reported on 06/20/2019) 15 mL 6  . insulin detemir (LEVEMIR) 100 UNIT/ML injection Inject 0.3 mLs (30 Units total) into the skin daily. (Patient not taking: Reported on 06/20/2019) 10 mL 11  . LO LOESTRIN FE 1 MG-10 MCG / 10 MCG tablet TAKE 1 TABLET BY MOUTH EVERY DAY (Patient not taking: Reported on 12/21/2019) 84 tablet 4  . metFORMIN (GLUCOPHAGE) 500 MG tablet Take 1 tablet (500 mg total) by mouth 2 (two)  times daily with a meal. (Patient not taking: Reported on 04/05/2019) 60 tablet 2  . sitaGLIPtin (JANUVIA) 100 MG tablet Take 1 tablet (100 mg total) by mouth daily. (Patient not taking: Reported on 06/20/2019) 30 tablet 1   No facility-administered medications prior to visit.    No Known Allergies  ROS Review of Systems  Constitutional: Negative.   HENT: Negative.   Eyes: Negative.   Respiratory: Negative.   Cardiovascular: Negative.   Gastrointestinal: Negative.   Genitourinary: Negative.   Musculoskeletal: Negative.   Skin: Negative.   Allergic/Immunologic: Negative.   Neurological: Negative.   Hematological: Negative.  Psychiatric/Behavioral: Negative.       Objective:    Physical Exam  Constitutional: She is oriented to person, place, and time. She appears well-developed and well-nourished.  HENT:  Head: Normocephalic and atraumatic.  Eyes: Conjunctivae are normal.  Cardiovascular: Normal rate, regular rhythm, normal heart sounds and intact distal pulses.  Pulmonary/Chest: Effort normal and breath sounds normal.  Abdominal: Soft. Bowel sounds are normal.  Musculoskeletal:        General: Normal range of motion.     Cervical back: Normal range of motion and neck supple.  Neurological: She is alert and oriented to person, place, and time. She has normal reflexes.  Skin: Skin is warm and dry.  Psychiatric: She has a normal mood and affect. Her behavior is normal. Judgment and thought content normal.  Nursing note and vitals reviewed.   BP 123/76   Pulse 71   Temp 98.3 F (36.8 C)   Ht 5' 6"  (1.676 m)   Wt 193 lb 3.2 oz (87.6 kg)   LMP 12/10/2018   SpO2 100%   BMI 31.18 kg/m  Wt Readings from Last 3 Encounters:  12/21/19 193 lb 3.2 oz (87.6 kg)  06/20/19 205 lb (93 kg)  04/05/19 209 lb (94.8 kg)     Health Maintenance Due  Topic Date Due  . INFLUENZA VACCINE  06/18/2019  . FOOT EXAM  11/02/2019  . URINE MICROALBUMIN  11/02/2019  . PAP SMEAR-Modifier   10/24/2019    There are no preventive care reminders to display for this patient.  Lab Results  Component Value Date   TSH 2.31 12/16/2016   Lab Results  Component Value Date   WBC 6.3 09/26/2018   HGB 9.8 (L) 09/26/2018   HCT 33.9 (L) 09/26/2018   MCV 81.5 09/26/2018   PLT 328 09/26/2018   Lab Results  Component Value Date   NA 134 (L) 09/26/2018   K 4.0 09/26/2018   CO2 24 09/26/2018   GLUCOSE 344 (H) 09/26/2018   BUN 8 09/26/2018   CREATININE 0.80 09/26/2018   BILITOT 0.5 12/17/2016   ALKPHOS 37 (L) 12/17/2016   AST 26 12/17/2016   ALT 26 12/17/2016   PROT 7.3 12/17/2016   ALBUMIN 3.6 12/17/2016   CALCIUM 9.0 09/26/2018   ANIONGAP 6 09/26/2018   GFR 128.62 10/31/2016   Lab Results  Component Value Date   CHOL 131 12/16/2016   Lab Results  Component Value Date   HDL 39 (L) 12/16/2016   Lab Results  Component Value Date   LDLCALC 65 12/16/2016   Lab Results  Component Value Date   TRIG 134 12/16/2016   Lab Results  Component Value Date   CHOLHDL 3.4 12/16/2016   Lab Results  Component Value Date   HGBA1C 5.8 (A) 12/21/2019      Assessment & Plan:   1. Type 2 diabetes mellitus without complication, without long-term current use of insulin (HCC) She will continue medication as prescribed, to decrease foods/beverages high in sugars and carbs and follow Heart Healthy or DASH diet. Increase physical activity to at least 30 minutes cardio exercise daily.  - POCT urinalysis dipstick - POCT glucose (manual entry) - POCT glycosylated hemoglobin (Hb A1C) - ibuprofen (ADVIL) 800 MG tablet; Take 1 tablet (800 mg total) by mouth every 8 (eight) hours as needed.  Dispense: 30 tablet; Refill: 3  2. Hyperglycemia  3. Hemoglobin A1c less than 7.0% Hgb A1c stable at 5.8 today. Patient is asked to monitor BP at home or  work, several times per month and return with written values at next office visit.  4. Essential hypertension The current medical regimen is  effective; blood pressure is stable at 123/76 today; continue present plan and medications as prescribed. She will continue to take medications as prescribed, to decrease high sodium intake, excessive alcohol intake, increase potassium intake, smoking cessation, and increase physical activity of at least 30 minutes of cardio activity daily. She will continue to follow Heart Healthy or DASH diet.  5. Birth control counseling She will contact office with birth control  6. Muscle strain We will initiate pain medication.  - ibuprofen (ADVIL) 800 MG tablet; Take 1 tablet (800 mg total) by mouth every 8 (eight) hours as needed.  Dispense: 30 tablet; Refill: 3  7. Weight loss Excellent! She has had an almost 20 lb weight loss in 9 months.   8. Follow up She will follow up in 6 months.   Meds ordered this encounter  Medications  . ibuprofen (ADVIL) 800 MG tablet    Sig: Take 1 tablet (800 mg total) by mouth every 8 (eight) hours as needed.    Dispense:  30 tablet    Refill:  3    Orders Placed This Encounter  Procedures  . POCT urinalysis dipstick  . POCT glucose (manual entry)  . POCT glycosylated hemoglobin (Hb A1C)   Referral Orders  No referral(s) requested today    Kathe Becton,  MSN, FNP-BC [address], [place] [address] [phone]- fax   Problem List Items Addressed This Visit      Cardiovascular and Mediastinum   Essential hypertension     Endocrine   Type II diabetes mellitus ([place]) - Primary   Relevant Medications   ibuprofen (ADVIL) 800 MG tablet   Other Relevant Orders   POCT urinalysis dipstick (Completed)   POCT glucose (manual entry) (Completed)   POCT glycosylated hemoglobin (Hb A1C) (Completed)     Other   Hyperglycemia   Relevant Medications   ibuprofen (ADVIL) 800 MG tablet    Other Visit Diagnoses    Hemoglobin A1c less than 7.0%        Birth control counseling       Muscle strain       Relevant Medications   ibuprofen (ADVIL) 800 MG tablet   Weight loss       Follow up          Meds ordered this encounter  Medications  . ibuprofen (ADVIL) 800 MG tablet    Sig: Take 1 tablet (800 mg total) by mouth every 8 (eight) hours as needed.    Dispense:  30 tablet    Refill:  3    Follow-up: Return in about 6 months (around 06/19/2020).    Azzie Glatter, FNP

## 2019-12-21 NOTE — Patient Instructions (Signed)
Ibuprofen tablets and capsules What is this medicine? IBUPROFEN (eye BYOO proe fen) is a non-steroidal anti-inflammatory drug (NSAID). It is used for dental pain, fever, headaches or migraines, osteoarthritis, rheumatoid arthritis, or painful monthly periods. It can also relieve minor aches and pains caused by a cold, flu, or sore throat. This medicine may be used for other purposes; ask your health care provider or pharmacist if you have questions. COMMON BRAND NAME(S): Advil, Advil Junior Strength, Advil Migraine, Genpril, Ibren, IBU, Ibupak, Midol, Midol Cramps and Body Aches, Motrin, Motrin IB, Motrin Junior Strength, Motrin Migraine Pain, Samson-8, Toxicology Saliva Collection What should I tell my health care provider before I take this medicine? They need to know if you have any of these conditions:  cigarette smoker  coronary artery bypass graft (CABG) surgery within the past 2 weeks  drink more than 3 alcohol-containing drinks a day  heart disease  high blood pressure  history of stomach bleeding  kidney disease  liver disease  lung or breathing disease, like asthma  an unusual or allergic reaction to ibuprofen, aspirin, other NSAIDs, other medicines, foods, dyes, or preservatives  pregnant or trying to get pregnant  breast-feeding How should I use this medicine? Take this medicine by mouth with a glass of water. Follow the directions on the prescription label. Take this medicine with food if your stomach gets upset. Try to not lie down for at least 10 minutes after you take the medicine. Take your medicine at regular intervals. Do not take your medicine more often than directed. A special MedGuide will be given to you by the pharmacist with each prescription and refill. Be sure to read this information carefully each time. Talk to your pediatrician regarding the use of this medicine in children. Special care may be needed. Overdosage: If you think you have taken too much  of this medicine contact a poison control center or emergency room at once. NOTE: This medicine is only for you. Do not share this medicine with others. What if I miss a dose? If you miss a dose, take it as soon as you can. If it is almost time for your next dose, take only that dose. Do not take double or extra doses. What may interact with this medicine? Do not take this medicine with any of the following medications:  cidofovir  ketorolac  methotrexate  pemetrexed This medicine may also interact with the following medications:  alcohol  aspirin  diuretics  lithium  other drugs for inflammation like prednisone  warfarin This list may not describe all possible interactions. Give your health care provider a list of all the medicines, herbs, non-prescription drugs, or dietary supplements you use. Also tell them if you smoke, drink alcohol, or use illegal drugs. Some items may interact with your medicine. What should I watch for while using this medicine? Tell your doctor or healthcare provider if your symptoms do not start to get better or if they get worse. This medicine may cause serious skin reactions. They can happen weeks to months after starting the medicine. Contact your healthcare provider right away if you notice fevers or flu-like symptoms with a rash. The rash may be red or purple and then turn into blisters or peeling of the skin. Or, you might notice a red rash with swelling of the face, lips or lymph nodes in your neck or under your arms. This medicine does not prevent heart attack or stroke. In fact, this medicine may increase the chance of a  heart attack or stroke. The chance may increase with longer use of this medicine and in people who have heart disease. If you take aspirin to prevent heart attack or stroke, talk with your doctor or healthcare provider. Do not take other medicines that contain aspirin, ibuprofen, or naproxen with this medicine. Side effects such as  stomach upset, nausea, or ulcers may be more likely to occur. Many medicines available without a prescription should not be taken with this medicine. This medicine can cause ulcers and bleeding in the stomach and intestines at any time during treatment. Ulcers and bleeding can happen without warning symptoms and can cause death. To reduce your risk, do not smoke cigarettes or drink alcohol while you are taking this medicine. You may get drowsy or dizzy. Do not drive, use machinery, or do anything that needs mental alertness until you know how this medicine affects you. Do not stand or sit up quickly, especially if you are an older patient. This reduces the risk of dizzy or fainting spells. This medicine can cause you to bleed more easily. Try to avoid damage to your teeth and gums when you brush or floss your teeth. This medicine may be used to treat migraines. If you take migraine medicines for 10 or more days a month, your migraines may get worse. Keep a diary of headache days and medicine use. Contact your healthcare provider if your migraine attacks occur more frequently. What side effects may I notice from receiving this medicine? Side effects that you should report to your doctor or health care professional as soon as possible:  allergic reactions like skin rash, itching or hives, swelling of the face, lips, or tongue  redness, blistering, peeling or loosening of the skin, including inside the mouth  severe stomach pain  signs and symptoms of bleeding such as bloody or black, tarry stools; red or dark-Haydel urine; spitting up blood or [place] material that looks like coffee grounds; red spots on the skin; unusual bruising or bleeding from the eye, gums, or nose  signs and symptoms of a blood clot such as changes in vision; chest pain; severe, sudden headache; trouble speaking; sudden numbness or weakness of the face, arm, or leg  unexplained weight gain or swelling  unusually weak or  tired  yellowing of eyes or skin Side effects that usually do not require medical attention (report to your doctor or health care professional if they continue or are bothersome):  bruising  diarrhea  dizziness, drowsiness  headache  nausea, vomiting This list may not describe all possible side effects. Call your doctor for medical advice about side effects. You may report side effects to FDA at 1-800-FDA-1088. Where should I keep my medicine? Keep out of the reach of children. Store at room temperature between 15 and 30 degrees C (59 and 86 degrees F). Keep container tightly closed. Throw away any unused medicine after the expiration date. NOTE: This sheet is a summary. It may not cover all possible information. If you have questions about this medicine, talk to your doctor, pharmacist, or health care provider.  2020 Elsevier/Gold Standard (2019-01-19 14:11:00) Muscle Strain A muscle strain is an injury that happens when a muscle is stretched longer than normal. This can happen during a fall, sports, or lifting. This can tear some muscle fibers. Usually, recovery from muscle strain takes 1-2 weeks. Complete healing normally takes 5-6 weeks. This condition is first treated with PRICE therapy. This involves:  Protecting your muscle from being injured again.    Resting your injured muscle.  Icing your injured muscle.  Applying pressure (compression) to your injured muscle. This may be done with a splint or elastic bandage.  Raising (elevating) your injured muscle. Your doctor may also recommend medicine for pain. Follow these instructions at home: If you have a splint:  Wear the splint as told by your doctor. Take it off only as told by your doctor.  Loosen the splint if your fingers or toes tingle, get numb, or turn cold and blue.  Keep the splint clean.  If the splint is not waterproof: ? Do not let it get wet. ? Cover it with a watertight covering when you take a bath or  a shower. Managing pain, stiffness, and swelling   If directed, put ice on your injured area. ? If you have a removable splint, take it off as told by your doctor. ? Put ice in a plastic bag. ? Place a towel between your skin and the bag. ? Leave the ice on for 20 minutes, 2-3 times a day.  Move your fingers or toes often. This helps to avoid stiffness and lessen swelling.  Raise your injured area above the level of your heart while you are sitting or lying down.  Wear an elastic bandage as told by your doctor. Make sure it is not too tight. General instructions  Take over-the-counter and prescription medicines only as told by your doctor.  Limit your activity. Rest your injured muscle as told by your doctor. Your doctor may say that gentle movements are okay.  If physical therapy was prescribed, do exercises as told by your doctor.  Do not put pressure on any part of the splint until it is fully hardened. This may take many hours.  Do not use any products that contain nicotine or tobacco, such as cigarettes and e-cigarettes. These can delay bone healing. If you need help quitting, ask your doctor.  Warm up before you exercise. This helps to prevent more muscle strains.  Ask your doctor when it is safe to drive if you have a splint.  Keep all follow-up visits as told by your doctor. This is important. Contact a doctor if:  You have more pain or swelling in your injured area. Get help right away if:  You have any of these problems in your injured area: ? You have numbness. ? You have tingling. ? You lose a lot of strength. Summary  A muscle strain is an injury that happens when a muscle is stretched longer than normal.  This condition is first treated with PRICE therapy. This includes protecting, resting, icing, adding pressure, and raising your injury.  Limit your activity. Rest your injured muscle as told by your doctor. Your doctor may say that gentle movements are  okay.  Warm up before you exercise. This helps to prevent more muscle strains. This information is not intended to replace advice given to you by your health care provider. Make sure you discuss any questions you have with your health care provider. Document Revised: 12/30/2018 Document Reviewed: 12/10/2016 Elsevier Patient Education  2020 [place] for Routine Care of Injuries Many injuries can be cared for with rest, ice, compression, and elevation (RICE therapy). This includes:  Resting the injured part.  Putting ice on the injury.  Putting pressure (compression) on the injury.  Raising the injured part (elevation). Using RICE therapy can help to lessen pain and swelling. Supplies needed:  Ice.  Plastic bag.  Towel.  Elastic  bandage.  Pillow or pillows to raise (elevate) your injured body part. How to care for your injury with RICE therapy Rest Limit your normal activities, and try not to use the injured part of your body. You can go back to your normal activities when your doctor says it is okay to do them and you feel okay. Ask your doctor if you should do exercises to help your injury get better. Ice Put ice on the injured area. Do not put ice on your bare skin.  Put ice in a plastic bag.  Place a towel between your skin and the bag.  Leave the ice on for 20 minutes, 2-3 times a day. Use ice on as many days as told by your doctor.  Compression Compression means putting pressure on the injured area. This can be done with an elastic bandage. If an elastic bandage has been put on your injury:  Do not wrap the bandage too tight. Wrap the bandage more loosely if part of your body away from the bandage is blue, swollen, cold, painful, or loses feeling (gets numb).  Take off the bandage and put it on again. Do this every 3-4 hours or as told by your doctor.  See your doctor if the bandage seems to make your problems worse.  Elevation Elevation means  keeping the injured area raised. If you can, raise the injured area above your heart or the center of your chest. Contact a doctor if:  You keep having pain and swelling.  Your symptoms get worse. Get help right away if:  You have sudden bad pain at your injury or lower than your injury.  You have redness or more swelling around your injury.  You have tingling or numbness at your injury or lower than your injury, and it does not go away when you take off the bandage. Summary  Many injuries can be cared for using rest, ice, compression, and elevation (RICE therapy).  You can go back to your normal activities when you feel okay and your doctor says it is okay.  Put ice on the injured area as told by your doctor.  Get help if your symptoms get worse or if you keep having pain and swelling. This information is not intended to replace advice given to you by your health care provider. Make sure you discuss any questions you have with your health care provider. Document Revised: 07/24/2017 Document Reviewed: 07/24/2017 Elsevier Patient Education  2020 [employer].

## 2020-01-12 DIAGNOSIS — Z124 Encounter for screening for malignant neoplasm of cervix: Secondary | ICD-10-CM | POA: Diagnosis not present

## 2020-01-12 DIAGNOSIS — Z01419 Encounter for gynecological examination (general) (routine) without abnormal findings: Secondary | ICD-10-CM | POA: Diagnosis not present

## 2020-01-12 DIAGNOSIS — Z6832 Body mass index (BMI) 32.0-32.9, adult: Secondary | ICD-10-CM | POA: Diagnosis not present

## 2020-02-03 DIAGNOSIS — H40053 Ocular hypertension, bilateral: Secondary | ICD-10-CM | POA: Diagnosis not present

## 2020-02-03 DIAGNOSIS — E119 Type 2 diabetes mellitus without complications: Secondary | ICD-10-CM | POA: Diagnosis not present

## 2020-02-24 ENCOUNTER — Ambulatory Visit (INDEPENDENT_AMBULATORY_CARE_PROVIDER_SITE_OTHER): Payer: BC Managed Care – PPO | Admitting: Family Medicine

## 2020-02-24 ENCOUNTER — Other Ambulatory Visit: Payer: Self-pay

## 2020-02-24 VITALS — BP 130/87 | HR 74 | Temp 98.7°F | Resp 14 | Ht 66.0 in | Wt 197.0 lb

## 2020-02-24 DIAGNOSIS — N898 Other specified noninflammatory disorders of vagina: Secondary | ICD-10-CM | POA: Diagnosis not present

## 2020-02-24 DIAGNOSIS — B379 Candidiasis, unspecified: Secondary | ICD-10-CM

## 2020-02-24 DIAGNOSIS — I10 Essential (primary) hypertension: Secondary | ICD-10-CM | POA: Diagnosis not present

## 2020-02-24 DIAGNOSIS — Z09 Encounter for follow-up examination after completed treatment for conditions other than malignant neoplasm: Secondary | ICD-10-CM

## 2020-02-24 DIAGNOSIS — R739 Hyperglycemia, unspecified: Secondary | ICD-10-CM

## 2020-02-24 DIAGNOSIS — R7309 Other abnormal glucose: Secondary | ICD-10-CM | POA: Diagnosis not present

## 2020-02-24 DIAGNOSIS — E119 Type 2 diabetes mellitus without complications: Secondary | ICD-10-CM | POA: Diagnosis not present

## 2020-02-24 LAB — POCT URINALYSIS DIPSTICK
Bilirubin, UA: NEGATIVE
Blood, UA: NEGATIVE
Glucose, UA: NEGATIVE
Ketones, UA: NEGATIVE
Leukocytes, UA: NEGATIVE
Nitrite, UA: NEGATIVE
Protein, UA: NEGATIVE
Spec Grav, UA: 1.025 (ref 1.010–1.025)
Urobilinogen, UA: 0.2 E.U./dL
pH, UA: 5.5 (ref 5.0–8.0)

## 2020-02-24 LAB — POCT GLYCOSYLATED HEMOGLOBIN (HGB A1C): Hemoglobin A1C: 6.1 % — AB (ref 4.0–5.6)

## 2020-02-24 LAB — GLUCOSE, POCT (MANUAL RESULT ENTRY): POC Glucose: 124 mg/dl — AB (ref 70–99)

## 2020-02-24 MED ORDER — BLOOD GLUCOSE METER KIT: PACK | 0 refills | Status: AC

## 2020-02-24 MED ORDER — OZEMPIC (1 MG/DOSE) 2 MG/1.5ML ~~LOC~~ SOPN: 1.0000 mg | PEN_INJECTOR | SUBCUTANEOUS | 3 refills | Status: DC

## 2020-02-24 MED ORDER — FLUCONAZOLE 150 MG PO TABS: 150.0000 mg | ORAL_TABLET | Freq: Once | ORAL | 0 refills | Status: AC

## 2020-02-24 NOTE — Patient Instructions (Signed)
Fluconazole tablets What is this medicine? FLUCONAZOLE (floo KON na zole) is an antifungal medicine. It is used to treat certain kinds of fungal or yeast infections. This medicine may be used for other purposes; ask your health care provider or pharmacist if you have questions. COMMON BRAND NAME(S): Diflucan What should I tell my health care provider before I take this medicine? They need to know if you have any of these conditions:  history of irregular heart beat  kidney disease  an unusual or allergic reaction to fluconazole, other azole antifungals, medicines, foods, dyes, or preservatives  pregnant or trying to get pregnant  breast-feeding How should I use this medicine? Take this medicine by mouth. Follow the directions on the prescription label. Do not take your medicine more often than directed. Talk to your pediatrician regarding the use of this medicine in children. Special care may be needed. This medicine has been used in children as young as 6 months of age. Overdosage: If you think you have taken too much of this medicine contact a poison control center or emergency room at once. NOTE: This medicine is only for you. Do not share this medicine with others. What if I miss a dose? If you miss a dose, take it as soon as you can. If it is almost time for your next dose, take only that dose. Do not take double or extra doses. What may interact with this medicine? Do not take this medicine with any of the following medications:  astemizole  certain medicines for irregular heart beat like dronedarone, quinidine  cisapride  erythromycin  lomitapide  other medicines that prolong the QT interval (cause an abnormal heart rhythm)  pimozide  terfenadine  thioridazine This medicine may also interact with the following medications:  antiviral medicines for HIV or AIDS  birth control pills  certain antibiotics like rifabutin, rifampin  certain medicines for blood  pressure like amlodipine, isradipine, felodipine, hydrochlorothiazide, losartan, nifedipine  certain medicines for cancer like cyclophosphamide, ibrutinib, vinblastine, vincristine  certain medicines for cholesterol like atorvastatin, lovastatin, fluvastatin, simvastatin  certain medicines for depression, anxiety, or psychotic disturbances like amitriptyline, midazolam, nortriptyline, triazolam  certain medicines for diabetes like glipizide, glyburide, tolbutamide  certain medicines for pain like alfentanil, fentanyl, methadone  certain medicines for seizures like carbamazepine, phenytoin  certain medicines that treat or prevent blood clots like warfarin  dofetilide  halofantrine  medicines that lower your chance of fighting infection like cyclosporine, prednisone, tacrolimus  NSAIDS, medicines for pain and inflammation, like celecoxib, diclofenac, flurbiprofen, ibuprofen, meloxicam, naproxen  other medicines for fungal infections  sirolimus  theophylline  tofacitinib  tolvaptan  ziprasidone This list may not describe all possible interactions. Give your health care provider a list of all the medicines, herbs, non-prescription drugs, or dietary supplements you use. Also tell them if you smoke, drink alcohol, or use illegal drugs. Some items may interact with your medicine. What should I watch for while using this medicine? Visit your doctor or health care professional for regular checkups. If you are taking this medicine for a long time you may need blood work. Tell your doctor if your symptoms do not improve. Some fungal infections need many weeks or months of treatment to cure. Alcohol can increase possible damage to your liver. Avoid alcoholic drinks. If you have a vaginal infection, do not have sex until you have finished your treatment. You can wear a sanitary napkin. Do not use tampons. Wear freshly washed cotton, not synthetic, panties. What side effects   may I notice  from receiving this medicine? Side effects that you should report to your doctor or health care professional as soon as possible:  allergic reactions like skin rash or itching, hives, swelling of the lips, mouth, tongue, or throat  dark urine  feeling dizzy or faint  irregular heartbeat or chest pain  redness, blistering, peeling or loosening of the skin, including inside the mouth  trouble breathing  unusual bruising or bleeding  vomiting  yellowing of the eyes or skin Side effects that usually do not require medical attention (report to your doctor or health care professional if they continue or are bothersome):  changes in how food tastes  diarrhea  headache  stomach upset or nausea This list may not describe all possible side effects. Call your doctor for medical advice about side effects. You may report side effects to FDA at 1-800-FDA-1088. Where should I keep my medicine? Keep out of the reach of children. Store at room temperature below 30 degrees C (86 degrees F). Throw away any medicine after the expiration date. NOTE: This sheet is a summary. It may not cover all possible information. If you have questions about this medicine, talk to your doctor, pharmacist, or health care provider.  2020 Elsevier/Gold Standard (2019-07-28 11:41:56) Vaginal Yeast Infection, Adult  Vaginal yeast infection is a condition that causes vaginal discharge as well as soreness, swelling, and redness (inflammation) of the vagina. This is a common condition. Some women get this infection frequently. What are the causes? This condition is caused by a change in the normal balance of the yeast (candida) and bacteria that live in the vagina. This change causes an overgrowth of yeast, which causes the inflammation. What increases the risk? The condition is more likely to develop in women who:  Take antibiotic medicines.  Have diabetes.  Take birth control pills.  Are pregnant.  Douche  often.  Have a weak body defense system (immune system).  Have been taking steroid medicines for a long time.  Frequently wear tight clothing. What are the signs or symptoms? Symptoms of this condition include:  White, thick, creamy vaginal discharge.  Swelling, itching, redness, and irritation of the vagina. The lips of the vagina (vulva) may be affected as well.  Pain or a burning feeling while urinating.  Pain during sex. How is this diagnosed? This condition is diagnosed based on:  Your medical history.  A physical exam.  A pelvic exam. Your health care provider will examine a sample of your vaginal discharge under a microscope. Your health care provider may send this sample for testing to confirm the diagnosis. How is this treated? This condition is treated with medicine. Medicines may be over-the-counter or prescription. You may be told to use one or more of the following:  Medicine that is taken by mouth (orally).  Medicine that is applied as a cream (topically).  Medicine that is inserted directly into the vagina (suppository). Follow these instructions at home:  Lifestyle  Do not have sex until your health care provider approves. Tell your sex partner that you have a yeast infection. That person should go to his or her health care provider and ask if they should also be treated.  Do not wear tight clothes, such as pantyhose or tight pants.  Wear breathable cotton underwear. General instructions  Take or apply over-the-counter and prescription medicines only as told by your health care provider.  Eat more yogurt. This may help to keep your yeast infection from returning.    Do not use tampons until your health care provider approves.  Try taking a sitz bath to help with discomfort. This is a warm water bath that is taken while you are sitting down. The water should only come up to your hips and should cover your buttocks. Do this 3-4 times per day or as told by  your health care provider.  Do not douche.  If you have diabetes, keep your blood sugar levels under control.  Keep all follow-up visits as told by your health care provider. This is important. Contact a health care provider if:  You have a fever.  Your symptoms go away and then return.  Your symptoms do not get better with treatment.  Your symptoms get worse.  You have new symptoms.  You develop blisters in or around your vagina.  You have blood coming from your vagina and it is not your menstrual period.  You develop pain in your abdomen. Summary  Vaginal yeast infection is a condition that causes discharge as well as soreness, swelling, and redness (inflammation) of the vagina.  This condition is treated with medicine. Medicines may be over-the-counter or prescription.  Take or apply over-the-counter and prescription medicines only as told by your health care provider.  Do not douche. Do not have sex or use tampons until your health care provider approves.  Contact a health care provider if your symptoms do not get better with treatment or your symptoms go away and then return. This information is not intended to replace advice given to you by your health care provider. Make sure you discuss any questions you have with your health care provider. Document Revised: 06/03/2019 Document Reviewed: 03/22/2018 Elsevier Patient Education  2020 [employer]  

## 2020-02-24 NOTE — Progress Notes (Signed)
Patient Angela Henderson [facility]    Established Patient Office Visit  Subjective:  Patient ID: Angela Henderson, female    DOB: 01/11/1991  Age: 29 y.o. MRN: [id number]  CC:  Chief Complaint  Patient presents with  . Diabetes  . Vaginitis    HPI Angela Henderson is a 29 year old female who presents for Follow Up today.   Past Medical History:  Diagnosis Date  . DKA, type 2 ([place]) 1st admission 11/02/2015  . Essential hypertension 02/05/2019  . Hyperlipidemia   . Type II diabetes mellitus ([place]) dx'd 09/2015   mother and grandmother are type 1, dad type 2   Current Status: Since her last office visit,sShe has c/o vaginal discharge and itching X 1 week now. She has used OTC Monistat with no relief in symptoms. She denies urinary frequency, dysuria, burning, odor, hematuria, and suprapubic pain/discomfort. She is doing well with no complaints.  She has not been monitoring her blood glucose levels lately. She denies fatigue, frequent urination, blurred vision, excessive hunger, excessive thirst, weight gain, weight loss, and poor wound healing. She continues to check her feet regularly. She continues to follow up with Endocrinology as needed. She denies fevers, chills, recent infections, weight loss, and night sweats. She has not had any headaches, visual changes, dizziness, and falls. No chest pain, heart palpitations, cough and shortness of breath reported. No reports of GI problems such as nausea, vomiting, diarrhea, and constipation. She has no reports of blood in stools. No depression or anxiety reported today. She denies suicidal ideations, homicidal ideations, or auditory hallucinations. She is taking all medications as prescribed.  Past Surgical History:  Procedure Laterality Date  . WISDOM TOOTH EXTRACTION      Family History  Problem Relation Age of Onset  . Diabetes Mother        Type I  . Hypertension Mother   . Diabetes Father   .  Hypertension Father   . Diabetes Cousin        Type I  . Cancer Maternal Aunt     Social History   Socioeconomic History  . Marital status: Single    Spouse name: Not on file  . Number of children: Not on file  . Years of education: Not on file  . Highest education level: Not on file  Occupational History  . Not on file  Tobacco Use  . Smoking status: Never Smoker  . Smokeless tobacco: Never Used  Substance and Sexual Activity  . Alcohol use: No    Alcohol/week: 0.0 standard drinks    Comment: 11/02/2015 "1-2 drinks on weekend maybe once/month"  . Drug use: No  . Sexual activity: Yes  Other Topics Concern  . Not on file  Social History Narrative  . Not on file   Social Determinants of Health   Financial Resource Strain:   . Difficulty of Paying Living Expenses:   Food Insecurity:   . Worried About Charity fundraiser in the Last Year:   . Arboriculturist in the Last Year:   Transportation Needs:   . Film/video editor (Medical):   Marland Kitchen Lack of Transportation (Non-Medical):   Physical Activity:   . Days of Exercise per Week:   . Minutes of Exercise per Session:   Stress:   . Feeling of Stress :   Social Connections:   . Frequency of Communication with Friends and Family:   . Frequency of Social Gatherings with  Friends and Family:   . Attends Religious Services:   . Active Member of Clubs or Organizations:   . Attends Archivist Meetings:   Marland Kitchen Marital Status:   Intimate Partner Violence:   . Fear of Current or Ex-Partner:   . Emotionally Abused:   Marland Kitchen Physically Abused:   . Sexually Abused:     Outpatient Medications Prior to Visit  Medication Sig Dispense Refill  . blood glucose meter kit and supplies Dispense based on patient and insurance preference. Use up to four times daily as directed. (FOR ICD-10 E10.9, E11.9). 1 each 0  . cetirizine (ZYRTEC) 10 MG tablet Take 1 tablet (10 mg total) by mouth daily. 30 tablet 11  . Continuous Blood Gluc  Receiver (FREESTYLE LIBRE READER) DEVI 1 Device by Does not apply route 3 (three) times daily between meals as needed. 1 Device 0  . Continuous Blood Gluc Sensor (FREESTYLE LIBRE 14 DAY SENSOR) MISC 1 Units/m2/day by Does not apply route 3 (three) times daily as needed. 2 each 11  . glucose blood test strip Use as instructed 100 each 12  . ibuprofen (ADVIL) 800 MG tablet Take 1 tablet (800 mg total) by mouth every 8 (eight) hours as needed. 30 tablet 3  . oxymetazoline (AFRIN NASAL SPRAY) 0.05 % nasal spray Place 1 spray into both nostrils 2 (two) times daily. 30 mL 0  . Semaglutide (OZEMPIC, 1 MG/DOSE, [place]) Inject 1 mg into the skin once a week.     No facility-administered medications prior to visit.    No Known Allergies  ROS Review of Systems  Constitutional: Negative.   HENT: Negative.   Eyes: Negative.   Respiratory: Negative.   Cardiovascular: Negative.   Gastrointestinal: Negative.   Endocrine: Negative.   Genitourinary: Negative.   Musculoskeletal: Negative.   Skin: Negative.   Allergic/Immunologic: Negative.   Neurological: Positive for dizziness (occasional ) and headaches (occasional ).  Hematological: Negative.   Psychiatric/Behavioral: Negative.       Objective:    Physical Exam  Constitutional: She is oriented to person, place, and time. She appears well-developed and well-nourished.  HENT:  Head: Normocephalic and atraumatic.  Eyes: Conjunctivae are normal.  Cardiovascular: Normal rate, regular rhythm, normal heart sounds and intact distal pulses.  Pulmonary/Chest: Effort normal and breath sounds normal.  Abdominal: Soft. Bowel sounds are normal.  Musculoskeletal:        General: Normal range of motion.     Cervical back: Normal range of motion and neck supple.  Neurological: She is alert and oriented to person, place, and time. She has normal reflexes.  Skin: Skin is warm and dry.  Psychiatric: She has a normal mood and affect. Her behavior is normal.  Judgment and thought content normal.  Nursing note and vitals reviewed.   BP 130/87 (BP Location: Left Arm, Patient Position: Sitting, Cuff Size: Large)   Pulse 74   Temp 98.7 F (37.1 C) (Oral)   Resp 14   Ht _0  (1.676 m)   Wt 197 lb (89.4 kg)   LMP 01/30/2020   SpO2 100%   BMI 31.80 kg/m  Wt Readings from Last 3 Encounters:  02/24/20 197 lb (89.4 kg)  12/21/19 193 lb 3.2 oz (87.6 kg)  06/20/19 205 lb (93 kg)     Health Maintenance Due  Topic Date Due  . PAP SMEAR-Modifier  10/24/2019  . FOOT EXAM  11/02/2019  . URINE MICROALBUMIN  11/02/2019  . OPHTHALMOLOGY EXAM  01/01/2020  There are no preventive care reminders to display for this patient.  Lab Results  Component Value Date   TSH 2.31 12/16/2016   Lab Results  Component Value Date   WBC 6.3 09/26/2018   HGB 9.8 (L) 09/26/2018   HCT 33.9 (L) 09/26/2018   MCV 81.5 09/26/2018   PLT 328 09/26/2018   Lab Results  Component Value Date   NA 134 (L) 09/26/2018   K 4.0 09/26/2018   CO2 24 09/26/2018   GLUCOSE 344 (H) 09/26/2018   BUN 8 09/26/2018   CREATININE 0.80 09/26/2018   BILITOT 0.5 12/17/2016   ALKPHOS 37 (L) 12/17/2016   AST 26 12/17/2016   ALT 26 12/17/2016   PROT 7.3 12/17/2016   ALBUMIN 3.6 12/17/2016   CALCIUM 9.0 09/26/2018   ANIONGAP 6 09/26/2018   GFR 128.62 10/31/2016   Lab Results  Component Value Date   CHOL 131 12/16/2016   Lab Results  Component Value Date   HDL 39 (L) 12/16/2016   Lab Results  Component Value Date   LDLCALC 65 12/16/2016   Lab Results  Component Value Date   TRIG 134 12/16/2016   Lab Results  Component Value Date   CHOLHDL 3.4 12/16/2016   Lab Results  Component Value Date   HGBA1C 5.8 (A) 12/21/2019      Assessment & Plan:   1. Type 2 diabetes mellitus without complication, without long-term current use of insulin (HCC) She will continue medication as prescribed, to decrease foods/beverages high in sugars and carbs and follow Heart  Healthy or DASH diet. Increase physical activity to at least 30 minutes cardio exercise daily.  - HgB A1c - Glucose (CBG) - Urinalysis Dipstick  2. Hyperglycemia  3. Hemoglobin A1c less than 7.0% Hgb A1c stable at 5.8. monitor.   4. Essential hypertension The current medical regimen is effective; blood pressure is stable at 130/87 today; continue present plan and medications as prescribed. She will continue to take medications as prescribed, to decrease high sodium intake, excessive alcohol intake, increase potassium intake, smoking cessation, and increase physical activity of at least 30 minutes of cardio activity daily. She will continue to follow Heart Healthy or DASH diet.  5. Follow up She will follow up in 6 months.  No orders of the defined types were placed in this encounter.   Orders Placed This Encounter  Procedures  . HgB A1c  . Glucose (CBG)  . Urinalysis Dipstick    Referral Orders  No referral(s) requested today    Kathe Becton,  MSN, FNP-BC [address] [place], [place] [address] [phone]- fax    Problem List Items Addressed This Visit      Cardiovascular and Mediastinum   Essential hypertension     Endocrine   Type II diabetes mellitus ([place]) - Primary   Relevant Orders   HgB A1c   Glucose (CBG)   Urinalysis Dipstick     Other   Hyperglycemia    Other Visit Diagnoses    Hemoglobin A1c less than 7.0%       Follow up          No orders of the defined types were placed in this encounter.   Follow-up: No follow-ups on file.    Azzie Glatter, FNP

## 2020-02-25 LAB — CBC WITH DIFFERENTIAL/PLATELET
Basophils Absolute: 0 10*3/uL (ref 0.0–0.2)
Basos: 1 %
EOS (ABSOLUTE): 0.1 10*3/uL (ref 0.0–0.4)
Eos: 2 %
Hematocrit: 35 % (ref 34.0–46.6)
Hemoglobin: 10.8 g/dL — ABNORMAL LOW (ref 11.1–15.9)
Immature Grans (Abs): 0 10*3/uL (ref 0.0–0.1)
Immature Granulocytes: 0 %
Lymphocytes Absolute: 1.9 10*3/uL (ref 0.7–3.1)
Lymphs: 36 %
MCH: 25.1 pg — ABNORMAL LOW (ref 26.6–33.0)
MCHC: 30.9 g/dL — ABNORMAL LOW (ref 31.5–35.7)
MCV: 81 fL (ref 79–97)
Monocytes Absolute: 0.5 10*3/uL (ref 0.1–0.9)
Monocytes: 9 %
Neutrophils Absolute: 2.9 10*3/uL (ref 1.4–7.0)
Neutrophils: 52 %
Platelets: 294 10*3/uL (ref 150–450)
RBC: 4.31 x10E6/uL (ref 3.77–5.28)
RDW: 14.3 % (ref 11.7–15.4)
WBC: 5.4 10*3/uL (ref 3.4–10.8)

## 2020-02-25 LAB — COMPREHENSIVE METABOLIC PANEL
ALT: 18 IU/L (ref 0–32)
AST: 15 IU/L (ref 0–40)
Albumin/Globulin Ratio: 1.5 (ref 1.2–2.2)
Albumin: 4.7 g/dL (ref 3.9–5.0)
Alkaline Phosphatase: 44 IU/L (ref 39–117)
BUN/Creatinine Ratio: 11 (ref 9–23)
BUN: 11 mg/dL (ref 6–20)
Bilirubin Total: 0.5 mg/dL (ref 0.0–1.2)
CO2: 19 mmol/L — ABNORMAL LOW (ref 20–29)
Calcium: 9.5 mg/dL (ref 8.7–10.2)
Chloride: 102 mmol/L (ref 96–106)
Creatinine, Ser: 1.03 mg/dL — ABNORMAL HIGH (ref 0.57–1.00)
GFR calc Af Amer: 84 mL/min/{1.73_m2} (ref >59)
GFR calc non Af Amer: 73 mL/min/{1.73_m2} (ref >59)
Globulin, Total: 3.1 g/dL (ref 1.5–4.5)
Glucose: 113 mg/dL — ABNORMAL HIGH (ref 65–99)
Potassium: 4.5 mmol/L (ref 3.5–5.2)
Sodium: 138 mmol/L (ref 134–144)
Total Protein: 7.8 g/dL (ref 6.0–8.5)

## 2020-02-28 LAB — NUSWAB VAGINITIS PLUS (VG+)
Candida albicans, NAA: NEGATIVE
Candida glabrata, NAA: NEGATIVE
Chlamydia trachomatis, NAA: NEGATIVE
Neisseria gonorrhoeae, NAA: UNDETERMINED — AB
Trich vag by NAA: NEGATIVE

## 2020-02-29 ENCOUNTER — Telehealth: Payer: Self-pay | Admitting: Family Medicine

## 2020-03-01 ENCOUNTER — Other Ambulatory Visit: Payer: Self-pay | Admitting: Family Medicine

## 2020-03-01 DIAGNOSIS — A749 Chlamydial infection, unspecified: Secondary | ICD-10-CM

## 2020-03-01 DIAGNOSIS — A549 Gonococcal infection, unspecified: Secondary | ICD-10-CM

## 2020-03-01 MED ORDER — DOXYCYCLINE HYCLATE 100 MG PO TABS: 100.0000 mg | ORAL_TABLET | Freq: Two times a day (BID) | ORAL | 0 refills | Status: DC

## 2020-03-01 MED ORDER — AZITHROMYCIN 500 MG PO TABS: ORAL_TABLET | ORAL | 0 refills | Status: DC

## 2020-03-06 NOTE — Telephone Encounter (Signed)
Done

## 2020-03-17 DIAGNOSIS — G4700 Insomnia, unspecified: Secondary | ICD-10-CM

## 2020-03-17 HISTORY — DX: Insomnia, unspecified: G47.00

## 2020-03-20 ENCOUNTER — Ambulatory Visit (INDEPENDENT_AMBULATORY_CARE_PROVIDER_SITE_OTHER): Payer: BC Managed Care – PPO | Admitting: Family Medicine

## 2020-03-20 ENCOUNTER — Encounter: Payer: Self-pay | Admitting: Family Medicine

## 2020-03-20 ENCOUNTER — Other Ambulatory Visit: Payer: Self-pay

## 2020-03-20 VITALS — BP 134/79 | HR 65 | Temp 98.8°F | Ht 66.0 in | Wt 197.8 lb

## 2020-03-20 DIAGNOSIS — R8290 Unspecified abnormal findings in urine: Secondary | ICD-10-CM

## 2020-03-20 DIAGNOSIS — G4700 Insomnia, unspecified: Secondary | ICD-10-CM

## 2020-03-20 DIAGNOSIS — Z09 Encounter for follow-up examination after completed treatment for conditions other than malignant neoplasm: Secondary | ICD-10-CM

## 2020-03-20 DIAGNOSIS — E119 Type 2 diabetes mellitus without complications: Secondary | ICD-10-CM | POA: Diagnosis not present

## 2020-03-20 DIAGNOSIS — N898 Other specified noninflammatory disorders of vagina: Secondary | ICD-10-CM

## 2020-03-20 LAB — POCT URINALYSIS DIPSTICK
Bilirubin, UA: NEGATIVE
Blood, UA: NEGATIVE
Glucose, UA: NEGATIVE
Ketones, UA: NEGATIVE
Nitrite, UA: NEGATIVE
Protein, UA: NEGATIVE
Spec Grav, UA: 1.025 (ref 1.010–1.025)
Urobilinogen, UA: 0.2 E.U./dL
pH, UA: 5.5 (ref 5.0–8.0)

## 2020-03-20 LAB — GLUCOSE, POCT (MANUAL RESULT ENTRY): POC Glucose: 109 mg/dl — AB (ref 70–99)

## 2020-03-20 MED ORDER — ALPRAZOLAM 0.5 MG PO TABS: 0.5000 mg | ORAL_TABLET | Freq: Every evening | ORAL | 0 refills | Status: DC | PRN

## 2020-03-20 NOTE — Patient Instructions (Signed)
Alprazolam tablets What is this medicine? ALPRAZOLAM (al PRAY zoe lam) is a benzodiazepine. It is used to treat anxiety and panic attacks. This medicine may be used for other purposes; ask your health care provider or pharmacist if you have questions. COMMON BRAND NAME(S): Xanax What should I tell my health care provider before I take this medicine? They need to know if you have any of these conditions:  an alcohol or drug abuse problem  bipolar disorder, depression, psychosis or other mental health conditions  glaucoma  kidney or liver disease  lung or breathing disease  myasthenia gravis  Parkinson's disease  porphyria  seizures or a history of seizures  suicidal thoughts  an unusual or allergic reaction to alprazolam, other benzodiazepines, foods, dyes, or preservatives  pregnant or trying to get pregnant  breast-feeding How should I use this medicine? Take this medicine by mouth with a glass of water. Follow the directions on the prescription label. Take your medicine at regular intervals. Do not take it more often than directed. Do not stop taking except on your doctor's advice. A special MedGuide will be given to you by the pharmacist with each prescription and refill. Be sure to read this information carefully each time. Talk to your pediatrician regarding the use of this medicine in children. Special care may be needed. Overdosage: If you think you have taken too much of this medicine contact a poison control center or emergency room at once. NOTE: This medicine is only for you. Do not share this medicine with others. What if I miss a dose? If you miss a dose, take it as soon as you can. If it is almost time for your next dose, take only that dose. Do not take double or extra doses. What may interact with this medicine? Do not take this medicine with any of the following medications:  certain antiviral medicines for HIV or AIDS like delavirdine,  indinavir  certain medicines for fungal infections like ketoconazole and itraconazole  narcotic medicines for cough  sodium oxybate This medicine may also interact with the following medications:  alcohol  antihistamines for allergy, cough and cold  certain antibiotics like clarithromycin, erythromycin, isoniazid, rifampin, rifapentine, rifabutin, and troleandomycin  certain medicines for blood pressure, heart disease, irregular heart beat  certain medicines for depression, like amitriptyline, fluoxetine, sertraline  certain medicines for seizures like carbamazepine, oxcarbazepine, phenobarbital, phenytoin, primidone  cimetidine  cyclosporine  female hormones, like estrogens or progestins and birth control pills, patches, rings, or injections  general anesthetics like halothane, isoflurane, methoxyflurane, propofol  grapefruit juice  local anesthetics like lidocaine, pramoxine, tetracaine  medicines that relax muscles for surgery  narcotic medicines for pain  other antiviral medicines for HIV or AIDS  phenothiazines like chlorpromazine, mesoridazine, prochlorperazine, thioridazine This list may not describe all possible interactions. Give your health care provider a list of all the medicines, herbs, non-prescription drugs, or dietary supplements you use. Also tell them if you smoke, drink alcohol, or use illegal drugs. Some items may interact with your medicine. What should I watch for while using this medicine? Tell your doctor or health care professional if your symptoms do not start to get better or if they get worse. Do not stop taking except on your doctor's advice. You may develop a severe reaction. Your doctor will tell you how much medicine to take. You may get drowsy or dizzy. Do not drive, use machinery, or do anything that needs mental alertness until you know how this medicine affects  you. To reduce the risk of dizzy and fainting spells, do not stand or sit up  quickly, especially if you are an older patient. Alcohol may increase dizziness and drowsiness. Avoid alcoholic drinks. If you are taking another medicine that also causes drowsiness, you may have more side effects. Give your health care provider a list of all medicines you use. Your doctor will tell you how much medicine to take. Do not take more medicine than directed. Call emergency for help if you have problems breathing or unusual sleepiness. What side effects may I notice from receiving this medicine? Side effects that you should report to your doctor or health care professional as soon as possible:  allergic reactions like skin rash, itching or hives, swelling of the face, lips, or tongue  breathing problems  confusion  loss of balance or coordination  signs and symptoms of low blood pressure like dizziness; feeling faint or lightheaded, falls; unusually weak or tired  suicidal thoughts or other mood changes Side effects that usually do not require medical attention (report to your doctor or health care professional if they continue or are bothersome):  dizziness  dry mouth  nausea, vomiting  tiredness This list may not describe all possible side effects. Call your doctor for medical advice about side effects. You may report side effects to FDA at 1-800-FDA-1088. Where should I keep my medicine? Keep out of the reach of children. This medicine can be abused. Keep your medicine in a safe place to protect it from theft. Do not share this medicine with anyone. Selling or giving away this medicine is dangerous and against the law. Store at room temperature between 20 and 25 degrees C (68 and 77 degrees F). This medicine may cause accidental overdose and death if taken by other adults, children, or pets. Mix any unused medicine with a substance like cat litter or coffee grounds. Then throw the medicine away in a sealed container like a sealed bag or a coffee can with a lid. Do not use  the medicine after the expiration date. NOTE: This sheet is a summary. It may not cover all possible information. If you have questions about this medicine, talk to your doctor, pharmacist, or health care provider.  2020 Elsevier/Gold Standard (2015-08-02 13:47:25) Generalized Anxiety Disorder, Adult Generalized anxiety disorder (GAD) is a mental health disorder. People with this condition constantly worry about everyday events. Unlike normal anxiety, worry related to GAD is not triggered by a specific event. These worries also do not fade or get better with time. GAD interferes with life functions, including relationships, work, and school. GAD can vary from mild to severe. People with severe GAD can have intense waves of anxiety with physical symptoms (panic attacks). What are the causes? The exact cause of GAD is not known. What increases the risk? This condition is more likely to develop in:  Women.  People who have a family history of anxiety disorders.  People who are very shy.  People who experience very stressful life events, such as the death of a loved one.  People who have a very stressful family environment. What are the signs or symptoms? People with GAD often worry excessively about many things in their lives, such as their health and family. They may also be overly concerned about:  Doing well at work.  Being on time.  Natural disasters.  Friendships. Physical symptoms of GAD include:  Fatigue.  Muscle tension or having muscle twitches.  Trembling or feeling shaky.  Being easily startled.  Feeling like your heart is pounding or racing.  Feeling out of breath or like you cannot take a deep breath.  Having trouble falling asleep or staying asleep.  Sweating.  Nausea, diarrhea, or irritable bowel syndrome (IBS).  Headaches.  Trouble concentrating or remembering facts.  Restlessness.  Irritability. How is this diagnosed? Your health care provider  can diagnose GAD based on your symptoms and medical history. You will also have a physical exam. The health care provider will ask specific questions about your symptoms, including how severe they are, when they started, and if they come and go. Your health care provider may ask you about your use of alcohol or drugs, including prescription medicines. Your health care provider may refer you to a mental health specialist for further evaluation. Your health care provider will do a thorough examination and may perform additional tests to rule out other possible causes of your symptoms. To be diagnosed with GAD, a person must have anxiety that:  Is out of his or her control.  Affects several different aspects of his or her life, such as work and relationships.  Causes distress that makes him or her unable to take part in normal activities.  Includes at least three physical symptoms of GAD, such as restlessness, fatigue, trouble concentrating, irritability, muscle tension, or sleep problems. Before your health care provider can confirm a diagnosis of GAD, these symptoms must be present more days than they are not, and they must last for six months or longer. How is this treated? The following therapies are usually used to treat GAD:  Medicine. Antidepressant medicine is usually prescribed for long-term daily control. Antianxiety medicines may be added in severe cases, especially when panic attacks occur.  Talk therapy (psychotherapy). Certain types of talk therapy can be helpful in treating GAD by providing support, education, and guidance. Options include: ? Cognitive behavioral therapy (CBT). People learn coping skills and techniques to ease their anxiety. They learn to identify unrealistic or negative thoughts and behaviors and to replace them with positive ones. ? Acceptance and commitment therapy (ACT). This treatment teaches people how to be mindful as a way to cope with unwanted thoughts and  feelings. ? Biofeedback. This process trains you to manage your body's response (physiological response) through breathing techniques and relaxation methods. You will work with a therapist while machines are used to monitor your physical symptoms.  Stress management techniques. These include yoga, meditation, and exercise. A mental health specialist can help determine which treatment is best for you. Some people see improvement with one type of therapy. However, other people require a combination of therapies. Follow these instructions at home:  Take over-the-counter and prescription medicines only as told by your health care provider.  Try to maintain a normal routine.  Try to anticipate stressful situations and allow extra time to manage them.  Practice any stress management or self-calming techniques as taught by your health care provider.  Do not punish yourself for setbacks or for not making progress.  Try to recognize your accomplishments, even if they are small.  Keep all follow-up visits as told by your health care provider. This is important. Contact a health care provider if:  Your symptoms do not get better.  Your symptoms get worse.  You have signs of depression, such as: ? A persistently sad, cranky, or irritable mood. ? Loss of enjoyment in activities that used to bring you joy. ? Change in weight or eating. ? Changes in  sleeping habits. ? Avoiding friends or family members. ? Loss of energy for normal tasks. ? Feelings of guilt or worthlessness. Get help right away if:  You have serious thoughts about hurting yourself or others. If you ever feel like you may hurt yourself or others, or have thoughts about taking your own life, get help right away. You can go to your nearest emergency department or call:  Your local emergency services (911 in the [place]  A suicide crisis helpline, such as the National Suicide Prevention Lifeline at 1-800-273-8255.  This is open 24 hours a day. Summary  Generalized anxiety disorder (GAD) is a mental health disorder that involves worry that is not triggered by a specific event.  People with GAD often worry excessively about many things in their lives, such as their health and family.  GAD may cause physical symptoms such as restlessness, trouble concentrating, sleep problems, frequent sweating, nausea, diarrhea, headaches, and trembling or muscle twitching.  A mental health specialist can help determine which treatment is best for you. Some people see improvement with one type of therapy. However, other people require a combination of therapies. This information is not intended to replace advice given to you by your health care provider. Make sure you discuss any questions you have with your health care provider. Document Revised: 10/16/2017 Document Reviewed: 09/23/2016 Elsevier Patient Education  2020 Elsevier Inc.  

## 2020-03-20 NOTE — Progress Notes (Signed)
Patient [name]   Established Patient Office Visit  Subjective:  Patient ID: Angela Henderson, female    DOB: [birth year]  Age: 29 y.o. MRN: [id number]  CC:  Chief Complaint  Patient presents with  . Follow-up    DM  . Vaginal Itching    Clear discharge with odor    HPI Angela Henderson is a 29 year old female who presents for Follow Up today.   Past Medical History:  Diagnosis Date  . DKA, type 2 ([place]) 1st admission 11/02/2015  . Essential hypertension 02/05/2019  . Hyperlipidemia   . Type II diabetes mellitus ([place]) dx'd 09/2015   mother and grandmother are type 1, dad type 2   Current Status: Since heer last office visit, she is doing well with no complaints. She has c/o discharge. She denies urinary frequency, discharge, dysuria, urinary itching, burning, odor, hematuria, and suprapubic pain/discomfort. She denies fevers, chills, fatigue, recent infections, weight loss, and night sweats. She has not had any headaches, visual changes, dizziness, and falls. No chest pain, heart palpitations, cough and shortness of breath reported. Denies GI problems such as nausea, vomiting, diarrhea, and constipation. She has no reports of blood in stools, dysuria and hematuria. No depression or anxiety, and denies suicidal ideations, homicidal ideations, or auditory hallucinations. She is taking all medications as prescribed. She denies pain today.   Past Surgical History:  Procedure Laterality Date  . WISDOM TOOTH EXTRACTION      Family History  Problem Relation Age of Onset  . Diabetes Mother        Type I  . Hypertension Mother   . Diabetes Father   . Hypertension Father   . Diabetes Cousin        Type I  . Cancer Maternal Aunt     Social History   Socioeconomic History  . Marital status: Single    Spouse name: Not on file  . Number of children: Not on file  . Years of education: Not on file  . Highest education level: Not on  file  Occupational History  . Not on file  Tobacco Use  . Smoking status: Never Smoker  . Smokeless tobacco: Never Used  Substance and Sexual Activity  . Alcohol use: No    Alcohol/week: 0.0 standard drinks    Comment: 11/02/2015 "1-2 drinks on weekend maybe once/month"  . Drug use: No  . Sexual activity: Yes  Other Topics Concern  . Not on file  Social History Narrative  . Not on file   Social Determinants of Health   Financial Resource Strain:   . Difficulty of Paying Living Expenses:   Food Insecurity:   . Worried About Charity fundraiser in the Last Year:   . Arboriculturist in the Last Year:   Transportation Needs:   . Film/video editor (Medical):   Marland Kitchen Lack of Transportation (Non-Medical):   Physical Activity:   . Days of Exercise per Week:   . Minutes of Exercise per Session:   Stress:   . Feeling of Stress :   Social Connections:   . Frequency of Communication with Friends and Family:   . Frequency of Social Gatherings with Friends and Family:   . Attends Religious Services:   . Active Member of Clubs or Organizations:   . Attends Archivist Meetings:   Marland Kitchen Marital Status:   Intimate Partner Violence:   . Fear of Current or  Ex-Partner:   . Emotionally Abused:   Marland Kitchen Physically Abused:   . Sexually Abused:     Outpatient Medications Prior to Visit  Medication Sig Dispense Refill  . azithromycin (ZITHROMAX) 500 MG tablet Take 4 tablets at once. 4 tablet 0  . blood glucose meter kit and supplies Dispense based on patient and insurance preference. Use up to four times daily as directed. (FOR ICD-10 E10.9, E11.9). 1 each 0  . cetirizine (ZYRTEC) 10 MG tablet Take 1 tablet (10 mg total) by mouth daily. 30 tablet 11  . Continuous Blood Gluc Receiver (FREESTYLE LIBRE READER) DEVI 1 Device by Does not apply route 3 (three) times daily between meals as needed. 1 Device 0  . Continuous Blood Gluc Sensor (FREESTYLE LIBRE 14 DAY SENSOR) MISC 1 Units/m2/day by  Does not apply route 3 (three) times daily as needed. 2 each 11  . doxycycline (VIBRA-TABS) 100 MG tablet Take 1 tablet (100 mg total) by mouth 2 (two) times daily. 14 tablet 0  . glucose blood test strip Use as instructed 100 each 12  . ibuprofen (ADVIL) 800 MG tablet Take 1 tablet (800 mg total) by mouth every 8 (eight) hours as needed. 30 tablet 3  . oxymetazoline (AFRIN NASAL SPRAY) 0.05 % nasal spray Place 1 spray into both nostrils 2 (two) times daily. 30 mL 0  . Semaglutide, 1 MG/DOSE, (OZEMPIC, 1 MG/DOSE,) 2 MG/1.5ML SOPN Inject 1 mg into the skin once a week. 1 pen 3   No facility-administered medications prior to visit.    No Known Allergies  ROS Review of Systems  Constitutional: Negative.   HENT: Negative.   Eyes: Negative.   Respiratory: Negative.   Cardiovascular: Negative.   Gastrointestinal: Negative.   Endocrine: Negative.   Genitourinary: Negative.   Musculoskeletal: Negative.   Skin: Negative.   Allergic/Immunologic: Negative.   Neurological: Positive for dizziness (occasional ) and headaches (occasional).  Hematological: Negative.   Psychiatric/Behavioral: Negative.       Objective:    Physical Exam  Constitutional: She is oriented to person, place, and time. She appears well-developed and well-nourished.  HENT:  Head: Normocephalic and atraumatic.  Eyes: Conjunctivae are normal.  Cardiovascular: Normal rate, regular rhythm, normal heart sounds and intact distal pulses.  Pulmonary/Chest: Effort normal and breath sounds normal.  Abdominal: Soft. Bowel sounds are normal.  Musculoskeletal:        General: Normal range of motion.     Cervical back: Normal range of motion and neck supple.  Neurological: She is alert and oriented to person, place, and time. She has normal reflexes.  Skin: Skin is warm and dry.  Psychiatric: She has a normal mood and affect. Her behavior is normal. Judgment and thought content normal.  Nursing note and vitals  reviewed.   BP 134/79   Pulse 65   Temp 98.8 F (37.1 C)   Ht 5' 6"  (1.676 m)   Wt 197 lb 12.8 oz (89.7 kg)   LMP 02/26/2020   SpO2 100%   BMI 31.93 kg/m  Wt Readings from Last 3 Encounters:  03/20/20 197 lb 12.8 oz (89.7 kg)  02/24/20 197 lb (89.4 kg)  12/21/19 193 lb 3.2 oz (87.6 kg)     Health Maintenance Due  Topic Date Due  . COVID-19 Vaccine (1) Never done  . PAP SMEAR-Modifier  10/24/2019  . FOOT EXAM  11/02/2019  . URINE MICROALBUMIN  11/02/2019  . OPHTHALMOLOGY EXAM  01/01/2020    There are no preventive care  reminders to display for this patient.  Lab Results  Component Value Date   TSH 2.31 12/16/2016   Lab Results  Component Value Date   WBC 5.4 02/24/2020   HGB 10.8 (L) 02/24/2020   HCT 35.0 02/24/2020   MCV 81 02/24/2020   PLT 294 02/24/2020   Lab Results  Component Value Date   NA 138 02/24/2020   K 4.5 02/24/2020   CO2 19 (L) 02/24/2020   GLUCOSE 113 (H) 02/24/2020   BUN 11 02/24/2020   CREATININE 1.03 (H) 02/24/2020   BILITOT 0.5 02/24/2020   ALKPHOS 44 02/24/2020   AST 15 02/24/2020   ALT 18 02/24/2020   PROT 7.8 02/24/2020   ALBUMIN 4.7 02/24/2020   CALCIUM 9.5 02/24/2020   ANIONGAP 6 09/26/2018   GFR 128.62 10/31/2016   Lab Results  Component Value Date   CHOL 131 12/16/2016   Lab Results  Component Value Date   HDL 39 (L) 12/16/2016   Lab Results  Component Value Date   LDLCALC 65 12/16/2016   Lab Results  Component Value Date   TRIG 134 12/16/2016   Lab Results  Component Value Date   CHOLHDL 3.4 12/16/2016   Lab Results  Component Value Date   HGBA1C 6.1 (A) 02/24/2020   Assessment & Plan:   1. Type 2 diabetes mellitus without complication, without long-term current use of insulin (HCC) She will continue medication as prescribed, to decrease foods/beverages high in sugars and carbs and follow Heart Healthy or DASH diet. Increase physical activity to at least 30 minutes cardio exercise daily.  - POCT  urinalysis dipstick - POCT glucose (manual entry)  2. Vaginal discharge Swab results are pending. - Vaginitis/Vaginosis, DNA Probe  3. Insomnia, unspecified type Initiated trial dose today.  - ALPRAZolam (XANAX) 0.5 MG tablet; Take 1 tablet (0.5 mg total) by mouth at bedtime as needed for anxiety.  Dispense: 30 tablet; Refill: 0  4. Abnormal urinalysis Results are pending. - Urine Culture  5. Follow up She will follow up in 6 months.   Meds ordered this encounter  Medications  . ALPRAZolam (XANAX) 0.5 MG tablet    Sig: Take 1 tablet (0.5 mg total) by mouth at bedtime as needed for anxiety.    Dispense:  30 tablet    Refill:  0    Order Specific Question:   Supervising Provider    Answer:   Tresa Garter [id number]    Orders Placed This Encounter  Procedures  . Vaginitis/Vaginosis, DNA Probe  . Urine Culture  . POCT urinalysis dipstick  . POCT glucose (manual entry)    Referral Orders  No referral(s) requested today    Kathe Becton,  MSN, FNP-BC [place], [place] [address] [phone]- fax   Problem List Items Addressed This Visit      Endocrine   Type II diabetes mellitus ([place]) - Primary   Relevant Orders   POCT urinalysis dipstick (Completed)   POCT glucose (manual entry) (Completed)    Other Visit Diagnoses    Vaginal discharge       Relevant Orders   Vaginitis/Vaginosis, DNA Probe (Completed)   Insomnia, unspecified type       Relevant Medications   ALPRAZolam (XANAX) 0.5 MG tablet   Abnormal urinalysis       Relevant Orders   Urine Culture (Completed)   Follow up  Meds ordered this encounter  Medications  . ALPRAZolam (XANAX) 0.5 MG tablet    Sig: Take 1 tablet (0.5 mg total) by mouth at bedtime as needed for anxiety.    Dispense:  30 tablet    Refill:  0    Order Specific Question:   Supervising Provider     Answer:   Tresa Garter [id number]    Follow-up: Return in about 6 months (around 09/20/2020).    Azzie Glatter, FNP

## 2020-03-22 LAB — URINE CULTURE: Organism ID, Bacteria: NO GROWTH

## 2020-03-22 LAB — VAGINITIS/VAGINOSIS, DNA PROBE
Candida Species: NEGATIVE
Gardnerella vaginalis: NEGATIVE
Trichomonas vaginosis: NEGATIVE

## 2020-03-25 ENCOUNTER — Encounter: Payer: Self-pay | Admitting: Family Medicine

## 2020-03-25 DIAGNOSIS — G4700 Insomnia, unspecified: Secondary | ICD-10-CM | POA: Insufficient documentation

## 2020-04-06 ENCOUNTER — Ambulatory Visit (INDEPENDENT_AMBULATORY_CARE_PROVIDER_SITE_OTHER): Payer: BC Managed Care – PPO | Admitting: Family Medicine

## 2020-04-06 ENCOUNTER — Encounter: Payer: Self-pay | Admitting: Family Medicine

## 2020-04-06 ENCOUNTER — Other Ambulatory Visit: Payer: Self-pay

## 2020-04-06 VITALS — BP 139/88 | HR 64 | Ht 66.0 in | Wt 200.4 lb

## 2020-04-06 DIAGNOSIS — J302 Other seasonal allergic rhinitis: Secondary | ICD-10-CM

## 2020-04-06 DIAGNOSIS — R7309 Other abnormal glucose: Secondary | ICD-10-CM | POA: Diagnosis not present

## 2020-04-06 DIAGNOSIS — R739 Hyperglycemia, unspecified: Secondary | ICD-10-CM

## 2020-04-06 DIAGNOSIS — N898 Other specified noninflammatory disorders of vagina: Secondary | ICD-10-CM | POA: Diagnosis not present

## 2020-04-06 DIAGNOSIS — E119 Type 2 diabetes mellitus without complications: Secondary | ICD-10-CM

## 2020-04-06 DIAGNOSIS — Z09 Encounter for follow-up examination after completed treatment for conditions other than malignant neoplasm: Secondary | ICD-10-CM

## 2020-04-06 DIAGNOSIS — T7840XD Allergy, unspecified, subsequent encounter: Secondary | ICD-10-CM

## 2020-04-06 LAB — POCT URINALYSIS DIPSTICK
Bilirubin, UA: NEGATIVE
Blood, UA: NEGATIVE
Glucose, UA: NEGATIVE
Ketones, UA: NEGATIVE
Leukocytes, UA: NEGATIVE
Nitrite, UA: NEGATIVE
Protein, UA: NEGATIVE
Spec Grav, UA: 1.025 (ref 1.010–1.025)
Urobilinogen, UA: 0.2 E.U./dL
pH, UA: 6 (ref 5.0–8.0)

## 2020-04-06 LAB — GLUCOSE, POCT (MANUAL RESULT ENTRY): POC Glucose: 136 mg/dl — AB (ref 70–99)

## 2020-04-06 MED ORDER — FLUTICASONE PROPIONATE 50 MCG/ACT NA SUSP: 2.0000 | Freq: Every day | NASAL | 6 refills | Status: DC

## 2020-04-06 NOTE — Progress Notes (Signed)
Patient [facility]   Established Patient Office Visit  Subjective:  Patient ID: Angela Henderson, female    DOB: 24-May-1991  Age: 29 y.o. MRN: [id number]  CC:  Chief Complaint  Patient presents with  . Follow-up    DM  . Vaginal Itching  . Vaginal Discharge    HPI Angela Henderson is a 29 year old female who presents for Follow Up today.   Past Medical History:  Diagnosis Date  . DKA, type 2 ([place]) 1st admission 11/02/2015  . Essential hypertension 02/05/2019  . Hyperlipidemia   . Insomnia 03/2020  . Type II diabetes mellitus ([place]) dx'd 09/2015   mother and grandmother are type 1, dad type 2   Patient Active Problem List   Diagnosis Date Noted  . Hemoglobin A1c less than 7.0% 04/06/2020  . Insomnia 03/25/2020  . Elevated glucose 04/05/2019  . Essential hypertension 02/05/2019  . Normal vaginal delivery 05/16/2017  . Normal labor 05/15/2017  . Type II diabetes mellitus ([place]) 12/16/2016  . Morbid obesity ([place]) 11/02/2015  . AKI (acute kidney injury) ([place]) 11/02/2015  . Hyperglycemia   . ANEMIA 08/06/2010  . SHORTNESS OF BREATH 08/06/2010  . FREQUENCY, URINARY 08/06/2010  . FATIGUE 05/30/2008  . ANEMIA-IRON DEFICIENCY 11/04/2007    Current Status: Since her last office visit, she is doing well with no complaints. She continues to follow up with Endocrinologist as needed. She denies fevers, chills, fatigue, recent infections, weight loss, and night sweats. She has not had any headaches, visual changes, dizziness, and falls. No chest pain, heart palpitations, cough and shortness of breath reported. Denies GI problems such as nausea, vomiting, diarrhea, and constipation. She has no reports of blood in stools, dysuria and hematuria. No depression or anxiety, and denies suicidal ideations, homicidal ideations, or auditory hallucinations. She is taking all medications as prescribed. She denies pain today.     Past Surgical  History:  Procedure Laterality Date  . WISDOM TOOTH EXTRACTION      Family History  Problem Relation Age of Onset  . Diabetes Mother        Type I  . Hypertension Mother   . Diabetes Father   . Hypertension Father   . Diabetes Cousin        Type I  . Cancer Maternal Aunt     Social History   Socioeconomic History  . Marital status: Single    Spouse name: Not on file  . Number of children: Not on file  . Years of education: Not on file  . Highest education level: Not on file  Occupational History  . Not on file  Tobacco Use  . Smoking status: Never Smoker  . Smokeless tobacco: Never Used  Substance and Sexual Activity  . Alcohol use: Yes    Alcohol/week: 0.0 standard drinks    Comment: 11/02/2015 "1-2 drinks on weekend maybe once/month"  . Drug use: No  . Sexual activity: Yes  Other Topics Concern  . Not on file  Social History Narrative  . Not on file   Social Determinants of Health   Financial Resource Strain:   . Difficulty of Paying Living Expenses:   Food Insecurity:   . Worried About Charity fundraiser in the Last Year:   . Arboriculturist in the Last Year:   Transportation Needs:   . Film/video editor (Medical):   Marland Kitchen Lack of Transportation (Non-Medical):   Physical Activity:   .  Days of Exercise per Week:   . Minutes of Exercise per Session:   Stress:   . Feeling of Stress :   Social Connections:   . Frequency of Communication with Friends and Family:   . Frequency of Social Gatherings with Friends and Family:   . Attends Religious Services:   . Active Member of Clubs or Organizations:   . Attends Archivist Meetings:   Marland Kitchen Marital Status:   Intimate Partner Violence:   . Fear of Current or Ex-Partner:   . Emotionally Abused:   Marland Kitchen Physically Abused:   . Sexually Abused:     Outpatient Medications Prior to Visit  Medication Sig Dispense Refill  . ALPRAZolam (XANAX) 0.5 MG tablet Take 1 tablet (0.5 mg total) by mouth at bedtime  as needed for anxiety. 30 tablet 0  . blood glucose meter kit and supplies Dispense based on patient and insurance preference. Use up to four times daily as directed. (FOR ICD-10 E10.9, E11.9). 1 each 0  . cetirizine (ZYRTEC) 10 MG tablet Take 1 tablet (10 mg total) by mouth daily. 30 tablet 11  . glucose blood test strip Use as instructed 100 each 12  . ibuprofen (ADVIL) 800 MG tablet Take 1 tablet (800 mg total) by mouth every 8 (eight) hours as needed. 30 tablet 3  . oxymetazoline (AFRIN NASAL SPRAY) 0.05 % nasal spray Place 1 spray into both nostrils 2 (two) times daily. 30 mL 0  . Semaglutide, 1 MG/DOSE, (OZEMPIC, 1 MG/DOSE,) 2 MG/1.5ML SOPN Inject 1 mg into the skin once a week. 1 pen 3  . Continuous Blood Gluc Receiver (FREESTYLE LIBRE READER) DEVI 1 Device by Does not apply route 3 (three) times daily between meals as needed. 1 Device 0  . Continuous Blood Gluc Sensor (FREESTYLE LIBRE 14 DAY SENSOR) MISC 1 Units/m2/day by Does not apply route 3 (three) times daily as needed. 2 each 11  . azithromycin (ZITHROMAX) 500 MG tablet Take 4 tablets at once. (Patient not taking: Reported on 04/06/2020) 4 tablet 0  . doxycycline (VIBRA-TABS) 100 MG tablet Take 1 tablet (100 mg total) by mouth 2 (two) times daily. (Patient not taking: Reported on 04/06/2020) 14 tablet 0   No facility-administered medications prior to visit.    No Known Allergies  ROS Review of Systems  Constitutional: Negative.   HENT: Negative.   Eyes: Negative.   Respiratory: Negative.   Cardiovascular: Negative.   Gastrointestinal: Positive for abdominal distention.  Endocrine: Negative.   Genitourinary: Negative.   Musculoskeletal: Negative.   Skin: Negative.   Allergic/Immunologic: Negative.   Neurological: Positive for dizziness (occasional ) and headaches (occasional ).  Hematological: Negative.   Psychiatric/Behavioral: Negative.       Objective:    Physical Exam  Constitutional: She is oriented to person,  place, and time. She appears well-developed and well-nourished.  HENT:  Head: Normocephalic and atraumatic.  Eyes: Conjunctivae are normal.  Cardiovascular: Normal rate, regular rhythm, normal heart sounds and intact distal pulses.  Pulmonary/Chest: Effort normal and breath sounds normal.  Abdominal: Soft. Bowel sounds are normal.  Musculoskeletal:        General: Normal range of motion.     Cervical back: Normal range of motion and neck supple.  Neurological: She is alert and oriented to person, place, and time. She has normal reflexes.  Skin: Skin is warm and dry.  Psychiatric: She has a normal mood and affect. Her behavior is normal. Judgment and thought content normal.  Nursing  note and vitals reviewed.   BP 139/88   Pulse 64   Ht _0  (1.676 m)   Wt 200 lb 6.4 oz (90.9 kg)   LMP 03/21/2020   SpO2 100%   BMI 32.35 kg/m  Wt Readings from Last 3 Encounters:  04/06/20 200 lb 6.4 oz (90.9 kg)  03/20/20 197 lb 12.8 oz (89.7 kg)  02/24/20 197 lb (89.4 kg)     Health Maintenance Due  Topic Date Due  . COVID-19 Vaccine (1) Never done  . PAP SMEAR-Modifier  10/24/2019  . FOOT EXAM  11/02/2019  . URINE MICROALBUMIN  11/02/2019  . OPHTHALMOLOGY EXAM  01/01/2020    There are no preventive care reminders to display for this patient.  Lab Results  Component Value Date   TSH 2.31 12/16/2016   Lab Results  Component Value Date   WBC 5.4 02/24/2020   HGB 10.8 (L) 02/24/2020   HCT 35.0 02/24/2020   MCV 81 02/24/2020   PLT 294 02/24/2020   Lab Results  Component Value Date   NA 138 02/24/2020   K 4.5 02/24/2020   CO2 19 (L) 02/24/2020   GLUCOSE 113 (H) 02/24/2020   BUN 11 02/24/2020   CREATININE 1.03 (H) 02/24/2020   BILITOT 0.5 02/24/2020   ALKPHOS 44 02/24/2020   AST 15 02/24/2020   ALT 18 02/24/2020   PROT 7.8 02/24/2020   ALBUMIN 4.7 02/24/2020   CALCIUM 9.5 02/24/2020   ANIONGAP 6 09/26/2018   GFR 128.62 10/31/2016   Lab Results  Component Value Date    CHOL 131 12/16/2016   Lab Results  Component Value Date   HDL 39 (L) 12/16/2016   Lab Results  Component Value Date   LDLCALC 65 12/16/2016   Lab Results  Component Value Date   TRIG 134 12/16/2016   Lab Results  Component Value Date   CHOLHDL 3.4 12/16/2016   Lab Results  Component Value Date   HGBA1C 6.1 (A) 02/24/2020      Assessment & Plan:   1. Type 2 diabetes mellitus without complication, without long-term current use of insulin (HCC) She will continue medication as prescribed, to decrease foods/beverages high in sugars and carbs and follow Heart Healthy or DASH diet. Increase physical activity to at least 30 minutes cardio exercise daily.  - POCT urinalysis dipstick - POCT glucose (manual entry)  2. Hemoglobin A1c less than 7.0% Hgb A1c stable at 6.1. Monitor.  3. Hyperglycemia   4. Allergy, subsequent encounter - fluticasone (FLONASE) 50 MCG/ACT nasal spray; Place 2 sprays into both nostrils daily.  Dispense: 16 g; Refill: 6  5. Vaginal discharge - NuSwab Vaginitis Plus (VG+)  6. Follow up She will follow up in 6 months.   Meds ordered this encounter  Medications  . fluticasone (FLONASE) 50 MCG/ACT nasal spray    Sig: Place 2 sprays into both nostrils daily.    Dispense:  16 g    Refill:  6    Orders Placed This Encounter  Procedures  . NuSwab Vaginitis Plus (VG+)  . POCT urinalysis dipstick  . POCT glucose (manual entry)    Referral Orders  No referral(s) requested today    Kathe Becton,  MSN, FNP-BC [place] [address] [place], [place] [address] [phone] fax  Problem List Items Addressed This Visit      Endocrine   Type II diabetes mellitus ([place]) - Primary   Relevant Orders  POCT urinalysis dipstick (Completed)   POCT glucose (manual entry) (Completed)     Other   Hemoglobin A1c less than 7.0%   Hyperglycemia    Other Visit  Diagnoses    Allergy, subsequent encounter       Relevant Medications   fluticasone (FLONASE) 50 MCG/ACT nasal spray   Vaginal discharge       Relevant Orders   NuSwab Vaginitis Plus (VG+)   Follow up          Meds ordered this encounter  Medications  . fluticasone (FLONASE) 50 MCG/ACT nasal spray    Sig: Place 2 sprays into both nostrils daily.    Dispense:  16 g    Refill:  6    Follow-up: Return in about 6 months (around 10/07/2020).    Azzie Glatter, FNP

## 2020-04-08 DIAGNOSIS — T7840XA Allergy, unspecified, initial encounter: Secondary | ICD-10-CM | POA: Insufficient documentation

## 2020-04-12 LAB — NUSWAB VAGINITIS PLUS (VG+)
Candida albicans, NAA: POSITIVE — AB
Candida glabrata, NAA: NEGATIVE
Chlamydia trachomatis, NAA: NEGATIVE
Neisseria gonorrhoeae, NAA: NEGATIVE
Trich vag by NAA: NEGATIVE

## 2020-04-13 ENCOUNTER — Other Ambulatory Visit: Payer: Self-pay | Admitting: Family Medicine

## 2020-04-13 DIAGNOSIS — B379 Candidiasis, unspecified: Secondary | ICD-10-CM

## 2020-04-13 MED ORDER — FLUCONAZOLE 150 MG PO TABS: 150.0000 mg | ORAL_TABLET | Freq: Once | ORAL | 0 refills | Status: AC

## 2020-04-25 ENCOUNTER — Telehealth: Payer: Self-pay | Admitting: Family Medicine

## 2020-04-25 NOTE — Telephone Encounter (Signed)
Called pt she said that she did review her results on my chart acct, but she didn't receive the medication for the yeast infection, she wants this got [facility].

## 2020-04-25 NOTE — Telephone Encounter (Signed)
Pt calling for test results. Pt has been waiting 2 wks.

## 2020-04-26 ENCOUNTER — Other Ambulatory Visit: Payer: Self-pay | Admitting: Family Medicine

## 2020-04-26 DIAGNOSIS — B379 Candidiasis, unspecified: Secondary | ICD-10-CM

## 2020-04-26 MED ORDER — FLUCONAZOLE 150 MG PO TABS: 150.0000 mg | ORAL_TABLET | Freq: Once | ORAL | 0 refills | Status: AC

## 2020-04-27 NOTE — Telephone Encounter (Signed)
Called inform patient that her rx was sent to the pharmacy .

## 2020-06-19 ENCOUNTER — Ambulatory Visit: Payer: BC Managed Care – PPO | Admitting: Family Medicine

## 2020-07-23 DIAGNOSIS — Z20822 Contact with and (suspected) exposure to covid-19: Secondary | ICD-10-CM | POA: Diagnosis not present

## 2020-08-05 DIAGNOSIS — Z20822 Contact with and (suspected) exposure to covid-19: Secondary | ICD-10-CM | POA: Diagnosis not present

## 2020-08-24 ENCOUNTER — Ambulatory Visit: Payer: BC Managed Care – PPO | Admitting: Family Medicine

## 2020-09-03 DIAGNOSIS — B373 Candidiasis of vulva and vagina: Secondary | ICD-10-CM | POA: Diagnosis not present

## 2020-09-03 DIAGNOSIS — Z113 Encounter for screening for infections with a predominantly sexual mode of transmission: Secondary | ICD-10-CM | POA: Diagnosis not present

## 2020-09-03 DIAGNOSIS — R350 Frequency of micturition: Secondary | ICD-10-CM | POA: Diagnosis not present

## 2020-09-03 DIAGNOSIS — Z6831 Body mass index (BMI) 31.0-31.9, adult: Secondary | ICD-10-CM | POA: Diagnosis not present

## 2020-09-10 DIAGNOSIS — E1165 Type 2 diabetes mellitus with hyperglycemia: Secondary | ICD-10-CM | POA: Diagnosis not present

## 2020-09-10 DIAGNOSIS — Z6832 Body mass index (BMI) 32.0-32.9, adult: Secondary | ICD-10-CM | POA: Diagnosis not present

## 2020-09-10 DIAGNOSIS — E119 Type 2 diabetes mellitus without complications: Secondary | ICD-10-CM | POA: Diagnosis not present

## 2020-09-10 DIAGNOSIS — Z713 Dietary counseling and surveillance: Secondary | ICD-10-CM | POA: Diagnosis not present

## 2020-09-11 ENCOUNTER — Encounter (HOSPITAL_COMMUNITY): Payer: Self-pay

## 2020-09-11 ENCOUNTER — Emergency Department (HOSPITAL_COMMUNITY)
Admission: EM | Admit: 2020-09-11 | Discharge: 2020-09-11 | Disposition: A | Discharge status: Home / Self Care | Payer: BC Managed Care – PPO | Attending: Emergency Medicine | Admitting: Emergency Medicine

## 2020-09-11 ENCOUNTER — Other Ambulatory Visit: Payer: Self-pay

## 2020-09-11 DIAGNOSIS — E1110 Type 2 diabetes mellitus with ketoacidosis without coma: Secondary | ICD-10-CM | POA: Insufficient documentation

## 2020-09-11 DIAGNOSIS — R739 Hyperglycemia, unspecified: Secondary | ICD-10-CM

## 2020-09-11 DIAGNOSIS — R112 Nausea with vomiting, unspecified: Secondary | ICD-10-CM | POA: Insufficient documentation

## 2020-09-11 DIAGNOSIS — I10 Essential (primary) hypertension: Secondary | ICD-10-CM | POA: Insufficient documentation

## 2020-09-11 DIAGNOSIS — R1033 Periumbilical pain: Secondary | ICD-10-CM | POA: Diagnosis not present

## 2020-09-11 DIAGNOSIS — E1165 Type 2 diabetes mellitus with hyperglycemia: Secondary | ICD-10-CM | POA: Insufficient documentation

## 2020-09-11 LAB — URINALYSIS, ROUTINE W REFLEX MICROSCOPIC
Bilirubin Urine: NEGATIVE
Glucose, UA: >=500 mg/dL — AB
Hgb urine dipstick: NEGATIVE
Ketones, ur: NEGATIVE mg/dL
Leukocytes,Ua: NEGATIVE
Nitrite: NEGATIVE
Protein, ur: NEGATIVE mg/dL
Specific Gravity, Urine: 1.024 (ref 1.005–1.030)
pH: 5 (ref 5.0–8.0)

## 2020-09-11 LAB — BASIC METABOLIC PANEL
Anion gap: 12 (ref 5–15)
BUN: 11 mg/dL (ref 6–20)
CO2: 20 mmol/L — ABNORMAL LOW (ref 22–32)
Calcium: 9.7 mg/dL (ref 8.9–10.3)
Chloride: 101 mmol/L (ref 98–111)
Creatinine, Ser: 0.93 mg/dL (ref 0.44–1.00)
GFR, Estimated: >60 mL/min (ref >60)
Glucose, Bld: 385 mg/dL — ABNORMAL HIGH (ref 70–99)
Potassium: 4 mmol/L (ref 3.5–5.1)
Sodium: 133 mmol/L — ABNORMAL LOW (ref 135–145)

## 2020-09-11 LAB — CBC
HCT: 37.8 % (ref 36.0–46.0)
Hemoglobin: 11.6 g/dL — ABNORMAL LOW (ref 12.0–15.0)
MCH: 25 pg — ABNORMAL LOW (ref 26.0–34.0)
MCHC: 30.7 g/dL (ref 30.0–36.0)
MCV: 81.5 fL (ref 80.0–100.0)
Platelets: 283 10*3/uL (ref 150–400)
RBC: 4.64 MIL/uL (ref 3.87–5.11)
RDW: 14.6 % (ref 11.5–15.5)
WBC: 5 10*3/uL (ref 4.0–10.5)
nRBC: 0 % (ref 0.0–0.2)

## 2020-09-11 LAB — I-STAT BETA HCG BLOOD, ED (MC, WL, AP ONLY): I-stat hCG, quantitative: <5 m[IU]/mL (ref <5)

## 2020-09-11 LAB — CBG MONITORING, ED
Glucose-Capillary: 342 mg/dL — ABNORMAL HIGH (ref 70–99)
Glucose-Capillary: 200 mg/dL — ABNORMAL HIGH (ref 70–99)

## 2020-09-11 MED ORDER — METOCLOPRAMIDE HCL 10 MG PO TABS: 10.0000 mg | ORAL_TABLET | Freq: Three times a day (TID) | ORAL | 0 refills | Status: DC

## 2020-09-11 MED ORDER — INSULIN ASPART 100 UNIT/ML ~~LOC~~ SOLN
6.0000 [IU] | Freq: Once | SUBCUTANEOUS | Status: AC
  Administered 2020-09-11: 6 [IU] via SUBCUTANEOUS
  Filled 2020-09-11: qty 0.06

## 2020-09-11 MED ORDER — METOCLOPRAMIDE HCL 5 MG/ML IJ SOLN
10.0000 mg | Freq: Once | INTRAMUSCULAR | Status: AC
  Administered 2020-09-11: 10 mg via INTRAVENOUS
  Filled 2020-09-11: qty 2

## 2020-09-11 MED ORDER — LACTATED RINGERS IV BOLUS
1000.0000 mL | Freq: Once | INTRAVENOUS | Status: AC
  Administered 2020-09-11: 1000 mL via INTRAVENOUS

## 2020-09-11 NOTE — Discharge Instructions (Addendum)
Please follow-up with your primary care provider this week regarding today's encounter and your elevated blood sugars.  I have prescribed Reglan, which I would encourage you to take as needed for your nausea symptoms until you can be evaluated by your PCP.  You may need to have your diabetic medications adjusted.  Your laboratory work-up and exam was reassuring.  No evidence of diabetic ketoacidosis.  Continue checking blood sugars regularly.  I encourage a low-carb diet.  Return to the ED or seek immediate medical attention should you experience any new or worsening symptoms.

## 2020-09-11 NOTE — ED Notes (Signed)
Pt unable to given UA at this time.

## 2020-09-11 NOTE — ED Provider Notes (Signed)
Patient was received at handoff from Evelena Leyden, PA-C.  He provided HPI, current work-up, likely disposition.    She was seen here due to hyperglycemia.  Lab work was reviewed CBC negative for leukocytosis,  shows normocytic anemia appears to be at baseline for patient.  BMP showing hyponatremia of 133, decreased CO2 of 20, hyperglycemia 385, no AKI, no anion gap noted.  UA was obtained negative for nitrates, leukocytes, hematuria.  Stat hCG negative less than 5.  While in the emergency department patient was provided 1 L of LR, insulin 6 units and Reglan.  Patient was reassessed after providing these, vital signs remained stable she does not appear to be in acute distress. Patient states she is feeling much better. She denies nausea, vomiting, diarrhea.  She states she feels ready and safe to go home.  Additional CBG was obtained shows her current glucose levels at 200.    I have low suspicion for DKA or HSS.  Low suspicion for systemic infection.  Low suspicion for UTI or pyelonephritis. Vital signs have remained stable, no indication for hospital admission.Will discharge patient per care agreements discharge instructions.   Patient given at home care as well strict return precautions.  Patient verbalized that they understood agreed to said plan.     Carroll Sage, PA-C 09/11/20 1714    Margarita Grizzle, MD 09/11/20 [phone]

## 2020-09-11 NOTE — ED Triage Notes (Signed)
Patient c/o CBG at work was 407 at work. Patient also c/o abdominal  Pain, N/V.   CBG in triage-[id number].

## 2020-09-11 NOTE — ED Provider Notes (Signed)
[facility] DEPT Provider Note   CSN: [id number] Arrival date & time: 09/11/20  1338     History Chief Complaint  Patient presents with  . Hyperglycemia  . Abdominal Pain  . Emesis    Angela Henderson is a 29 y.o. female with PMH of type II DM who presents the ED with complaints of hyperglycemia, abdominal pain, nausea, and vomiting.  Patient reports that she has been taking Ozempic, as directed for her type II DM for the past year, with good success.  She endorses a 15 pound weight loss and states that her blood sugars have been largely controlled.  However, over the course of the past couple of weeks, she has noticed a rise in her blood sugars, as high as 550.  She is also endorsing periumbilical abdominal cramping, nausea, and several episodes of nonbloody emesis over the course of the past few days.  She had one today while at work which prompted her to come to the ED for evaluation.  She is also endorsing mild GERD symptoms, increased urinary frequency, and feelings of dehydration.  Patient denies any fevers or chills, chest pain or shortness of breath, cough, congestion, dysuria, melena or other changes in bowel habits.   HPI     Past Medical History:  Diagnosis Date  . DKA, type 2 ([place]) 1st admission 11/02/2015  . Essential hypertension 02/05/2019  . Hyperlipidemia   . Insomnia 03/2020  . Type II diabetes mellitus ([place]) dx'd 09/2015   mother and grandmother are type 1, dad type 2    Patient Active Problem List   Diagnosis Date Noted  . Allergies 04/08/2020  . Hemoglobin A1c less than 7.0% 04/06/2020  . Insomnia 03/25/2020  . Elevated glucose 04/05/2019  . Essential hypertension 02/05/2019  . Normal vaginal delivery 05/16/2017  . Normal labor 05/15/2017  . Type II diabetes mellitus ([place]) 12/16/2016  . Morbid obesity ([place]) 11/02/2015  . AKI (acute kidney injury) ([place]) 11/02/2015  . Hyperglycemia   . ANEMIA 08/06/2010  . SHORTNESS  OF BREATH 08/06/2010  . FREQUENCY, URINARY 08/06/2010  . FATIGUE 05/30/2008  . ANEMIA-IRON DEFICIENCY 11/04/2007    Past Surgical History:  Procedure Laterality Date  . WISDOM TOOTH EXTRACTION       OB History    Gravida  1   Para  1   Term  1   Preterm      AB      Living  1     SAB      TAB      Ectopic      Multiple  0   Live Births  1           Family History  Problem Relation Age of Onset  . Diabetes Mother        Type I  . Hypertension Mother   . Diabetes Father   . Hypertension Father   . Diabetes Cousin        Type I  . Cancer Maternal Aunt     Social History   Tobacco Use  . Smoking status: Never Smoker  . Smokeless tobacco: Never Used  Vaping Use  . Vaping Use: Never used  Substance Use Topics  . Alcohol use: Yes    Alcohol/week: 0.0 standard drinks  . Drug use: No    Home Medications Prior to Admission medications   Medication Sig Start Date End Date Taking? Authorizing Provider  ALPRAZolam Duanne Moron) 0.5 MG tablet Take 1 tablet (  0.5 mg total) by mouth at bedtime as needed for anxiety. 03/20/20  Yes Azzie Glatter, FNP  ibuprofen (ADVIL) 800 MG tablet Take 1 tablet (800 mg total) by mouth every 8 (eight) hours as needed. Patient taking differently: Take 800 mg by mouth every 8 (eight) hours as needed for moderate pain.  12/21/19  Yes Azzie Glatter, FNP  Semaglutide, 1 MG/DOSE, (OZEMPIC, 1 MG/DOSE,) 2 MG/1.5ML SOPN Inject 1 mg into the skin once a week. Patient taking differently: Inject 1 mg into the skin once a week. Fridays 02/24/20  Yes Azzie Glatter, FNP  blood glucose meter kit and supplies Dispense based on patient and insurance preference. Use up to four times daily as directed. (FOR ICD-10 E10.9, E11.9). 02/24/20   Azzie Glatter, FNP  cetirizine (ZYRTEC) 10 MG tablet Take 1 tablet (10 mg total) by mouth daily. Patient not taking: Reported on 09/11/2020 11/01/18   Azzie Glatter, FNP  Continuous Blood Gluc Receiver  (FREESTYLE LIBRE READER) DEVI 1 Device by Does not apply route 3 (three) times daily between meals as needed. 04/05/19   Azzie Glatter, FNP  Continuous Blood Gluc Sensor (FREESTYLE LIBRE 14 DAY SENSOR) MISC 1 Units/m2/day by Does not apply route 3 (three) times daily as needed. 04/06/19   Azzie Glatter, FNP  fluticasone (FLONASE) 50 MCG/ACT nasal spray Place 2 sprays into both nostrils daily. Patient not taking: Reported on 09/11/2020 04/06/20   Azzie Glatter, FNP  glucose blood test strip Use as instructed 09/27/18   Azzie Glatter, FNP  metoCLOPramide (REGLAN) 10 MG tablet Take 1 tablet (10 mg total) by mouth 3 (three) times daily before meals. 09/11/20   Corena Herter, PA-C  oxymetazoline (AFRIN NASAL SPRAY) 0.05 % nasal spray Place 1 spray into both nostrils 2 (two) times daily. Patient not taking: Reported on 09/11/2020 11/01/18   Azzie Glatter, FNP    Allergies    Patient has no known allergies.  Review of Systems   Review of Systems  All other systems reviewed and are negative.   Physical Exam Updated Vital Signs BP 132/81   Pulse 73   Temp 97.7 F (36.5 C) (Oral)   Resp 16   Ht 5' 6"  (1.676 m)   Wt 86.6 kg   LMP 07/23/2020   SpO2 100%   BMI 30.83 kg/m   Physical Exam Vitals and nursing note reviewed. Exam conducted with a chaperone present.  Constitutional:      General: She is not in acute distress.    Appearance: Normal appearance. She is not ill-appearing.  HENT:     Head: Normocephalic and atraumatic.  Eyes:     General: No scleral icterus.    Conjunctiva/sclera: Conjunctivae normal.  Cardiovascular:     Rate and Rhythm: Normal rate and regular rhythm.     Pulses: Normal pulses.     Heart sounds: Normal heart sounds.  Pulmonary:     Effort: Pulmonary effort is normal. No respiratory distress.     Breath sounds: Normal breath sounds. No wheezing or rales.  Abdominal:     General: Abdomen is flat. There is no distension.     Palpations:  Abdomen is soft. There is no mass.     Tenderness: There is no abdominal tenderness. There is no guarding.     Comments: Soft, nondistended. Negative Murphy sign and no RUQ abdominal tenderness.  Very mild periumbilical "cramping" tenderness, but no TTP elsewhere.  Negative McBurney point tenderness.  No overlying skin changes.  Musculoskeletal:     Cervical back: Normal range of motion and neck supple. No rigidity.  Skin:    General: Skin is dry.     Capillary Refill: Capillary refill takes less than 2 seconds.  Neurological:     Mental Status: She is alert.     GCS: GCS eye subscore is 4. GCS verbal subscore is 5. GCS motor subscore is 6.  Psychiatric:        Mood and Affect: Mood normal.        Behavior: Behavior normal.        Thought Content: Thought content normal.     ED Results / Procedures / Treatments   Labs (all labs ordered are listed, but only abnormal results are displayed) Labs Reviewed  BASIC METABOLIC PANEL - Abnormal; Notable for the following components:      Result Value   Sodium 133 (*)    CO2 20 (*)    Glucose, Bld 385 (*)    All other components within normal limits  CBC - Abnormal; Notable for the following components:   Hemoglobin 11.6 (*)    MCH 25.0 (*)    All other components within normal limits  URINALYSIS, ROUTINE W REFLEX MICROSCOPIC - Abnormal; Notable for the following components:   APPearance HAZY (*)    Glucose, UA >=500 (*)    Bacteria, UA RARE (*)    All other components within normal limits  CBG MONITORING, ED - Abnormal; Notable for the following components:   Glucose-Capillary 342 (*)    All other components within normal limits  CBG MONITORING, ED  I-STAT BETA HCG BLOOD, ED ([facility], [facility], [facility] ONLY)    EKG None  Radiology No results found.  Procedures Procedures (including critical care time)  Medications Ordered in ED Medications  insulin aspart (novoLOG) injection 6 Units (has no administration in time range)  lactated  ringers bolus 1,000 mL (1,000 mLs Intravenous New Bag/Given 09/11/20 1500)  metoCLOPramide (REGLAN) injection 10 mg (10 mg Intravenous Given 09/11/20 1457)    ED Course  I have reviewed the triage vital signs and the nursing notes.  Pertinent labs & imaging results that were available during my care of the patient were reviewed by me and considered in my medical decision making (see chart for details).    MDM Rules/Calculators/A&P                          Patient is presented to the ED with complaints of symptomatic hyperglycemia versus early DKA.  Will obtain basic laboratory work-up.  My physical exam, specifically abdominal exam, is benign and reassuring.  Do not feel imaging is warranted.  Her vital signs have been stable and within normal limits here in the ED.  Labs CBC: Mild anemia with hemoglobin 11.6, improved when compared to baseline. BMP: Potassium within normal limits at 4.0.  Hyperglycemia to 385.  Mildly reduced bicarbonate to 20, but no elevated anion gap. UA: Glucosuria, without evidence of ketonuria.  No evidence to suggest infection. I-STAT beta-hCG: Negative.   Patient is receiving 1 L LR here in the ED in addition to Reglan IV.  Given that her blood glucose is still elevated to 385 on BMP, will treat with 6 units of insulin in addition to fluids.  On subsequent evaluation, patient is feeling entirely improved after fluids and Reglan.  She states that she will follow up with her primary care provider in  the next few days regarding today's encounter and for ongoing diabetic management.  Once her CBG is in the 200s and she is feeling improved, patient is safe for discharge and outpatient follow-up.  At shift change care was transferred to [facility], PA-C who will follow pending studies, re-evaluate, and determine disposition.     Final Clinical Impression(s) / ED Diagnoses Final diagnoses:  Hyperglycemia    Rx / DC Orders ED Discharge Orders         Ordered     metoCLOPramide (REGLAN) 10 MG tablet  3 times daily before meals        09/11/20 1543           Corena Herter, PA-C 09/11/20 1543    Davonna Belling, MD 09/11/20 [phone]

## 2020-09-12 ENCOUNTER — Telehealth: Payer: Self-pay

## 2020-09-12 NOTE — Telephone Encounter (Signed)
Transition Care Management Unsuccessful Follow-up Telephone Call  Date of discharge and from where:  09/11/2020 from [facility]  Attempts:  1st Attempt  Reason for unsuccessful TCM follow-up call:  Left voice message

## 2020-09-13 NOTE — Telephone Encounter (Signed)
Transition Care Management Follow-up Telephone Call  Date of discharge and from where: 09/11/2020 from [facility]  How have you been since you were released from the hospital? Patient states that she is feeling much better and that they changed her insulin dose. Patient has not questions or concerns at this time.   Any questions or concerns? No  Items Reviewed:  Did the pt receive and understand the discharge instructions provided? Yes   Medications obtained and verified? Yes   Other? No   Any new allergies since your discharge? No   Dietary orders reviewed? YES, DM friendly diet  Do you have support at home? Yes   Functional Questionnaire: (I = Independent and D = Dependent) ADLs: I  Bathing/Dressing- I  Meal Prep- I  Eating- I  Maintaining continence- I  Transferring/Ambulation- I  Managing Meds- I   Follow up appointments reviewed:   PCP Hospital f/u appt confirmed? Yes  Scheduled to see Bradly Chris, FNP on 09/21/2020 @ 09:00am.   Are transportation arrangements needed? No   If their condition worsens, is the pt aware to call PCP or go to the Emergency Dept.? Yes  Was the patient provided with contact information for the PCP's office or ED? Yes  Was to pt encouraged to call back with questions or concerns? Yes

## 2020-09-13 NOTE — Telephone Encounter (Signed)
Transition Care Management Unsuccessful Follow-up Telephone Call  Date of discharge and from where:  09/11/2020 from [facility]  Attempts:  2nd Attempt  Reason for unsuccessful TCM follow-up call:  Left voice message

## 2020-09-21 ENCOUNTER — Ambulatory Visit: Payer: BC Managed Care – PPO | Admitting: Family Medicine

## 2020-10-05 ENCOUNTER — Ambulatory Visit: Payer: BC Managed Care – PPO | Admitting: Family Medicine

## 2020-10-09 ENCOUNTER — Other Ambulatory Visit: Payer: Self-pay

## 2020-10-09 ENCOUNTER — Encounter: Payer: Self-pay | Admitting: Family Medicine

## 2020-10-09 ENCOUNTER — Ambulatory Visit (INDEPENDENT_AMBULATORY_CARE_PROVIDER_SITE_OTHER): Payer: BC Managed Care – PPO | Admitting: Family Medicine

## 2020-10-09 VITALS — BP 122/74 | HR 64 | Temp 98.7°F | Resp 16 | Ht 66.0 in | Wt 200.8 lb

## 2020-10-09 DIAGNOSIS — M62830 Muscle spasm of back: Secondary | ICD-10-CM | POA: Diagnosis not present

## 2020-10-09 DIAGNOSIS — E119 Type 2 diabetes mellitus without complications: Secondary | ICD-10-CM | POA: Diagnosis not present

## 2020-10-09 DIAGNOSIS — R7303 Prediabetes: Secondary | ICD-10-CM | POA: Diagnosis not present

## 2020-10-09 DIAGNOSIS — R7309 Other abnormal glucose: Secondary | ICD-10-CM | POA: Diagnosis not present

## 2020-10-09 LAB — POCT GLYCOSYLATED HEMOGLOBIN (HGB A1C)
HbA1c POC (<> result, manual entry): 9 % (ref 4.0–5.6)
HbA1c, POC (controlled diabetic range): 9 % — AB (ref 0.0–7.0)
HbA1c, POC (prediabetic range): 9 % — AB (ref 5.7–6.4)
Hemoglobin A1C: 9 % — AB (ref 4.0–5.6)

## 2020-10-09 LAB — GLUCOSE, POCT (MANUAL RESULT ENTRY): POC Glucose: 129 mg/dl — AB (ref 70–99)

## 2020-10-09 MED ORDER — CYCLOBENZAPRINE HCL 10 MG PO TABS: 10.0000 mg | ORAL_TABLET | Freq: Three times a day (TID) | ORAL | 3 refills | Status: AC | PRN

## 2020-10-09 NOTE — Patient Instructions (Signed)
Cyclobenzaprine tablets What is this medicine? CYCLOBENZAPRINE (sye kloe BEN za preen) is a muscle relaxer. It is used to treat muscle pain, spasms, and stiffness. This medicine may be used for other purposes; ask your health care provider or pharmacist if you have questions. COMMON BRAND NAME(S): Fexmid, Flexeril What should I tell my health care provider before I take this medicine? They need to know if you have any of these conditions:  heart disease, irregular heartbeat, or previous heart attack  liver disease  thyroid problem  an unusual or allergic reaction to cyclobenzaprine, tricyclic antidepressants, lactose, other medicines, foods, dyes, or preservatives  pregnant or trying to get pregnant  breast-feeding How should I use this medicine? Take this medicine by mouth with a glass of water. Follow the directions on the prescription label. If this medicine upsets your stomach, take it with food or milk. Take your medicine at regular intervals. Do not take it more often than directed. Talk to your pediatrician regarding the use of this medicine in children. Special care may be needed. Overdosage: If you think you have taken too much of this medicine contact a poison control center or emergency room at once. NOTE: This medicine is only for you. Do not share this medicine with others. What if I miss a dose? If you miss a dose, take it as soon as you can. If it is almost time for your next dose, take only that dose. Do not take double or extra doses. What may interact with this medicine? Do not take this medicine with any of the following medications:  MAOIs like Carbex, Eldepryl, Marplan, Nardil, and Parnate  narcotic medicines for cough  safinamide This medicine may also interact with the following medications:  alcohol  bupropion  antihistamines for allergy, cough and cold  certain medicines for anxiety or sleep  certain medicines for bladder problems like oxybutynin,  tolterodine  certain medicines for depression like amitriptyline, fluoxetine, sertraline  certain medicines for Parkinson's disease like benztropine, trihexyphenidyl  certain medicines for seizures like phenobarbital, primidone  certain medicines for stomach problems like dicyclomine, hyoscyamine  certain medicines for travel sickness like scopolamine  general anesthetics like halothane, isoflurane, methoxyflurane, propofol  ipratropium  local anesthetics like lidocaine, pramoxine, tetracaine  medicines that relax muscles for surgery  narcotic medicines for pain  phenothiazines like chlorpromazine, mesoridazine, prochlorperazine, thioridazine  verapamil This list may not describe all possible interactions. Give your health care provider a list of all the medicines, herbs, non-prescription drugs, or dietary supplements you use. Also tell them if you smoke, drink alcohol, or use illegal drugs. Some items may interact with your medicine. What should I watch for while using this medicine? Tell your doctor or health care professional if your symptoms do not start to get better or if they get worse. You may get drowsy or dizzy. Do not drive, use machinery, or do anything that needs mental alertness until you know how this medicine affects you. Do not stand or sit up quickly, especially if you are an older patient. This reduces the risk of dizzy or fainting spells. Alcohol may interfere with the effect of this medicine. Avoid alcoholic drinks. If you are taking another medicine that also causes drowsiness, you may have more side effects. Give your health care provider a list of all medicines you use. Your doctor will tell you how much medicine to take. Do not take more medicine than directed. Call emergency for help if you have problems breathing or unusual sleepiness.   Your mouth may get dry. Chewing sugarless gum or sucking hard candy, and drinking plenty of water may help. Contact your  doctor if the problem does not go away or is severe. What side effects may I notice from receiving this medicine? Side effects that you should report to your doctor or health care professional as soon as possible:  allergic reactions like skin rash, itching or hives, swelling of the face, lips, or tongue  breathing problems  chest pain  fast, irregular heartbeat  hallucinations  seizures  unusually weak or tired Side effects that usually do not require medical attention (report to your doctor or health care professional if they continue or are bothersome):  headache  nausea, vomiting This list may not describe all possible side effects. Call your doctor for medical advice about side effects. You may report side effects to FDA at 1-800-FDA-1088. Where should I keep my medicine? Keep out of the reach of children. Store at room temperature between 15 and 30 degrees C (59 and 86 degrees F). Keep container tightly closed. Throw away any unused medicine after the expiration date. NOTE: This sheet is a summary. It may not cover all possible information. If you have questions about this medicine, talk to your doctor, pharmacist, or health care provider.  2020 Elsevier/Gold Standard (2018-10-06 12:49:26) Muscle Cramps and Spasms Muscle cramps and spasms are when muscles tighten by themselves. They usually get better within minutes. Muscle cramps are painful. They are usually stronger and last longer than muscle spasms. Muscle spasms may or may not be painful. They can last a few seconds or much longer. Cramps and spasms can affect any muscle, but they occur most often in the calf muscles of the leg. They are usually not caused by a serious problem. In many cases, the cause is not known. Some common causes include:  Doing more physical work or exercise than your body is ready for.  Using the muscles too much (overuse) by repeating certain movements too many times.  Staying in a certain  position for a long time.  Playing a sport or doing an activity without preparing properly.  Using bad form or technique while playing a sport or doing an activity.  Not having enough water in your body (dehydration).  Injury.  Side effects of some medicines.  Low levels of the salts and minerals in your blood (electrolytes), such as low potassium or calcium. Follow these instructions at home: Managing pain and stiffness      Massage, stretch, and relax the muscle. Do this for many minutes at a time.  If told, put heat on tight or tense muscles as often as told by your doctor. Use the heat source that your doctor recommends, such as a moist heat pack or a heating pad. ? Place a towel between your skin and the heat source. ? Leave the heat on for 20-30 minutes. ? Remove the heat if your skin turns bright red. This is very important if you are not able to feel pain, heat, or cold. You may have a greater risk of getting burned.  If told, put ice on the affected area. This may help if you are sore or have pain after a cramp or spasm. ? Put ice in a plastic bag. ? Place a towel between your skin and the bag. ? Leave the ice on for 20 minutes, 2-3 times a day.  Try taking hot showers or baths to help relax tight muscles. Eating and drinking    Drink enough fluid to keep your pee (urine) pale yellow.  Eat a healthy diet to help ensure that your muscles work well. This should include: ? Fruits and vegetables. ? Lean protein. ? Whole grains. ? Low-fat or nonfat dairy products. General instructions  If you are having cramps often, avoid intense exercise for several days.  Take over-the-counter and prescription medicines only as told by your doctor.  Watch for any changes in your symptoms.  Keep all follow-up visits as told by your doctor. This is important. Contact a doctor if:  Your cramps or spasms get worse or happen more often.  Your cramps or spasms do not get better  with time. Summary  Muscle cramps and spasms are when muscles tighten by themselves. They usually get better within minutes.  Cramps and spasms occur most often in the calf muscles of the leg.  Massage, stretch, and relax the muscle. This may help the cramp or spasm go away.  Drink enough fluid to keep your pee (urine) pale yellow. This information is not intended to replace advice given to you by your health care provider. Make sure you discuss any questions you have with your health care provider. Document Revised: 03/29/2018 Document Reviewed: 03/29/2018 Elsevier Patient Education  2020 Elsevier Inc.  

## 2020-10-09 NOTE — Progress Notes (Signed)
Patient [facility] Follow Up  Subjective:  Patient ID: Angela Henderson, female    DOB: [birth year]  Age: 29 y.o. MRN: [id number]  CC:  Chief Complaint  Patient presents with  . Follow-up    Pt states she was in the hospital  in October.    HPI Angela Henderson is a 29 year old female who presents for Follow Up today.    Patient Active Problem List   Diagnosis Date Noted  . Allergies 04/08/2020  . Hemoglobin A1c less than 7.0% 04/06/2020  . Insomnia 03/25/2020  . Elevated glucose 04/05/2019  . Essential hypertension 02/05/2019  . Normal vaginal delivery 05/16/2017  . Normal labor 05/15/2017  . Type II diabetes mellitus ([place]) 12/16/2016  . Morbid obesity ([place]) 11/02/2015  . AKI (acute kidney injury) ([place]) 11/02/2015  . Hyperglycemia   . ANEMIA 08/06/2010  . SHORTNESS OF BREATH 08/06/2010  . FREQUENCY, URINARY 08/06/2010  . FATIGUE 05/30/2008  . ANEMIA-IRON DEFICIENCY 11/04/2007    Current Status: Since her last office visit, she has had an ED visit for Hyperglycemia on 09/11/2020. Today, she is doing well with no complaints. She continues to follow up with Podiatry as needed. Her most recent normal range of preprandial blood glucose levels have been between 129-135. She has seen low range of 122 and high of 459 since his last office visit. she denies fatigue, frequent urination, blurred vision, excessive hunger, excessive thirst, weight gain, weight loss, and poor wound healing. She continues to check her feet regularly. She denies fevers, chills, fatigue, recent infections, weight loss, and night sweats. She has not had any headaches, visual changes, dizziness, and falls. No chest pain, heart palpitations, cough and shortness of breath reported. Denies GI problems such as nausea, vomiting, diarrhea, and constipation. She has no reports of blood in stools, dysuria and hematuria. No depression or anxiety reported today. She is  taking all medications as prescribed. She denies pain today.   Past Medical History:  Diagnosis Date  . DKA, type 2 ([place]) 1st admission 11/02/2015  . Essential hypertension 02/05/2019  . Hyperlipidemia   . Insomnia 03/2020  . Type II diabetes mellitus ([place]) dx'd 09/2015   mother and grandmother are type 1, dad type 2    Past Surgical History:  Procedure Laterality Date  . WISDOM TOOTH EXTRACTION      Family History  Problem Relation Age of Onset  . Diabetes Mother        Type I  . Hypertension Mother   . Diabetes Father   . Hypertension Father   . Diabetes Cousin        Type I  . Cancer Maternal Aunt     Social History   Socioeconomic History  . Marital status: Single    Spouse name: Not on file  . Number of children: Not on file  . Years of education: Not on file  . Highest education level: Not on file  Occupational History  . Not on file  Tobacco Use  . Smoking status: Never Smoker  . Smokeless tobacco: Never Used  Vaping Use  . Vaping Use: Never used  Substance and Sexual Activity  . Alcohol use: Yes    Alcohol/week: 0.0 standard drinks  . Drug use: No  . Sexual activity: Yes  Other Topics Concern  . Not on file  Social History Narrative  . Not on file   Social Determinants of Health   Financial Resource  Strain:   . Difficulty of Paying Living Expenses: Not on file  Food Insecurity:   . Worried About Charity fundraiser in the Last Year: Not on file  . Ran Out of Food in the Last Year: Not on file  Transportation Needs:   . Lack of Transportation (Medical): Not on file  . Lack of Transportation (Non-Medical): Not on file  Physical Activity:   . Days of Exercise per Week: Not on file  . Minutes of Exercise per Session: Not on file  Stress:   . Feeling of Stress : Not on file  Social Connections:   . Frequency of Communication with Friends and Family: Not on file  . Frequency of Social Gatherings with Friends and Family: Not on file  . Attends  Religious Services: Not on file  . Active Member of Clubs or Organizations: Not on file  . Attends Archivist Meetings: Not on file  . Marital Status: Not on file  Intimate Partner Violence:   . Fear of Current or Ex-Partner: Not on file  . Emotionally Abused: Not on file  . Physically Abused: Not on file  . Sexually Abused: Not on file    Outpatient Medications Prior to Visit  Medication Sig Dispense Refill  . ALPRAZolam (XANAX) 0.5 MG tablet Take 1 tablet (0.5 mg total) by mouth at bedtime as needed for anxiety. 30 tablet 0  . blood glucose meter kit and supplies Dispense based on patient and insurance preference. Use up to four times daily as directed. (FOR ICD-10 E10.9, E11.9). 1 each 0  . cetirizine (ZYRTEC) 10 MG tablet Take 1 tablet (10 mg total) by mouth daily. (Patient not taking: Reported on 09/11/2020) 30 tablet 11  . Continuous Blood Gluc Receiver (FREESTYLE LIBRE READER) DEVI 1 Device by Does not apply route 3 (three) times daily between meals as needed. 1 Device 0  . Continuous Blood Gluc Sensor (FREESTYLE LIBRE 14 DAY SENSOR) MISC 1 Units/m2/day by Does not apply route 3 (three) times daily as needed. 2 each 11  . fluticasone (FLONASE) 50 MCG/ACT nasal spray Place 2 sprays into both nostrils daily. (Patient not taking: Reported on 09/11/2020) 16 g 6  . glucose blood test strip Use as instructed 100 each 12  . ibuprofen (ADVIL) 800 MG tablet Take 1 tablet (800 mg total) by mouth every 8 (eight) hours as needed. (Patient taking differently: Take 800 mg by mouth every 8 (eight) hours as needed for moderate pain. ) 30 tablet 3  . metoCLOPramide (REGLAN) 10 MG tablet Take 1 tablet (10 mg total) by mouth 3 (three) times daily before meals. 30 tablet 0  . oxymetazoline (AFRIN NASAL SPRAY) 0.05 % nasal spray Place 1 spray into both nostrils 2 (two) times daily. (Patient not taking: Reported on 09/11/2020) 30 mL 0  . Semaglutide, 1 MG/DOSE, (OZEMPIC, 1 MG/DOSE,) 2 MG/1.5ML SOPN  Inject 1 mg into the skin once a week. (Patient taking differently: Inject 1 mg into the skin once a week. Fridays) 1 pen 3   No facility-administered medications prior to visit.    No Known Allergies  ROS Review of Systems  Constitutional: Negative.   HENT: Negative.   Eyes: Negative.   Respiratory: Negative.   Cardiovascular: Negative.   Gastrointestinal: Negative.   Endocrine: Negative.   Genitourinary: Negative.   Musculoskeletal: Negative.   Skin: Negative.   Allergic/Immunologic: Negative.   Neurological: Positive for dizziness (occasional ) and headaches (occasional ).  Hematological: Negative.   Psychiatric/Behavioral:  Negative.       Objective:    Physical Exam Vitals and nursing note reviewed.  Constitutional:      Appearance: Normal appearance.  HENT:     Head: Normocephalic and atraumatic.     Nose: Nose normal.     Mouth/Throat:     Mouth: Mucous membranes are moist.     Pharynx: Oropharynx is clear.  Cardiovascular:     Rate and Rhythm: Normal rate and regular rhythm.     Pulses: Normal pulses.  Pulmonary:     Effort: Pulmonary effort is normal.     Breath sounds: Normal breath sounds.  Abdominal:     General: Bowel sounds are normal.     Palpations: Abdomen is soft.  Musculoskeletal:        General: Normal range of motion.     Cervical back: Normal range of motion and neck supple.  Skin:    General: Skin is warm and dry.  Neurological:     General: No focal deficit present.     Mental Status: She is alert and oriented to person, place, and time.  Psychiatric:        Mood and Affect: Mood normal.        Behavior: Behavior normal.        Thought Content: Thought content normal.        Judgment: Judgment normal.     BP 122/74 (BP Location: Left Arm, Patient Position: Sitting, Cuff Size: Normal)   Pulse 64   Temp 98.7 F (37.1 C)   Resp 16   Ht _0  (1.676 m)   Wt 200 lb 12.8 oz (91.1 kg)   LMP 09/17/2020 (Exact Date)   SpO2 100%    BMI 32.41 kg/m  Wt Readings from Last 3 Encounters:  10/09/20 200 lb 12.8 oz (91.1 kg)  09/11/20 191 lb (86.6 kg)  04/06/20 200 lb 6.4 oz (90.9 kg)     Health Maintenance Due  Topic Date Due  . Hepatitis C Screening  Never done  . COVID-19 Vaccine (1) Never done  . PAP SMEAR-Modifier  10/24/2019  . FOOT EXAM  11/02/2019  . URINE MICROALBUMIN  11/02/2019  . OPHTHALMOLOGY EXAM  01/01/2020    There are no preventive care reminders to display for this patient.  Lab Results  Component Value Date   TSH 2.31 12/16/2016   Lab Results  Component Value Date   WBC 5.0 09/11/2020   HGB 11.6 (L) 09/11/2020   HCT 37.8 09/11/2020   MCV 81.5 09/11/2020   PLT 283 09/11/2020   Lab Results  Component Value Date   NA 133 (L) 09/11/2020   K 4.0 09/11/2020   CO2 20 (L) 09/11/2020   GLUCOSE 385 (H) 09/11/2020   BUN 11 09/11/2020   CREATININE 0.93 09/11/2020   BILITOT 0.5 02/24/2020   ALKPHOS 44 02/24/2020   AST 15 02/24/2020   ALT 18 02/24/2020   PROT 7.8 02/24/2020   ALBUMIN 4.7 02/24/2020   CALCIUM 9.7 09/11/2020   ANIONGAP 12 09/11/2020   GFR 128.62 10/31/2016   Lab Results  Component Value Date   CHOL 131 12/16/2016   Lab Results  Component Value Date   HDL 39 (L) 12/16/2016   Lab Results  Component Value Date   LDLCALC 65 12/16/2016   Lab Results  Component Value Date   TRIG 134 12/16/2016   Lab Results  Component Value Date   CHOLHDL 3.4 12/16/2016   Lab Results  Component Value Date  HGBA1C 9.0 (A) 10/09/2020   HGBA1C 9.0 10/09/2020   HGBA1C 9.0 (A) 10/09/2020   HGBA1C 9.0 (A) 10/09/2020      Assessment & Plan:   Problem List Items Addressed This Visit      Endocrine   Type II diabetes mellitus ([place]) - Primary   Relevant Orders   POC Glucose (CBG) (Completed)   POC HgB A1c (Completed)      No orders of the defined types were placed in this encounter.   Follow-up: No follow-ups on file.    Azzie Glatter, FNP

## 2020-10-29 DIAGNOSIS — Z6832 Body mass index (BMI) 32.0-32.9, adult: Secondary | ICD-10-CM | POA: Diagnosis not present

## 2020-10-29 DIAGNOSIS — E1165 Type 2 diabetes mellitus with hyperglycemia: Secondary | ICD-10-CM | POA: Diagnosis not present

## 2020-10-29 DIAGNOSIS — Z713 Dietary counseling and surveillance: Secondary | ICD-10-CM | POA: Diagnosis not present

## 2020-10-29 DIAGNOSIS — E119 Type 2 diabetes mellitus without complications: Secondary | ICD-10-CM | POA: Diagnosis not present

## 2020-11-14 DIAGNOSIS — E119 Type 2 diabetes mellitus without complications: Secondary | ICD-10-CM | POA: Diagnosis not present

## 2020-11-14 DIAGNOSIS — Z20828 Contact with and (suspected) exposure to other viral communicable diseases: Secondary | ICD-10-CM | POA: Diagnosis not present

## 2020-11-29 DIAGNOSIS — Z20822 Contact with and (suspected) exposure to covid-19: Secondary | ICD-10-CM | POA: Diagnosis not present

## 2021-01-23 DIAGNOSIS — Z124 Encounter for screening for malignant neoplasm of cervix: Secondary | ICD-10-CM | POA: Diagnosis not present

## 2021-01-23 DIAGNOSIS — Z01419 Encounter for gynecological examination (general) (routine) without abnormal findings: Secondary | ICD-10-CM | POA: Diagnosis not present

## 2021-02-20 ENCOUNTER — Ambulatory Visit (INDEPENDENT_AMBULATORY_CARE_PROVIDER_SITE_OTHER): Payer: BC Managed Care – PPO | Admitting: Family Medicine

## 2021-02-20 ENCOUNTER — Other Ambulatory Visit: Payer: Self-pay

## 2021-02-20 ENCOUNTER — Encounter: Payer: Self-pay | Admitting: Family Medicine

## 2021-02-20 VITALS — BP 145/94 | HR 63 | Ht 66.0 in | Wt 203.0 lb

## 2021-02-20 DIAGNOSIS — R7309 Other abnormal glucose: Secondary | ICD-10-CM

## 2021-02-20 DIAGNOSIS — J302 Other seasonal allergic rhinitis: Secondary | ICD-10-CM

## 2021-02-20 DIAGNOSIS — E119 Type 2 diabetes mellitus without complications: Secondary | ICD-10-CM

## 2021-02-20 DIAGNOSIS — Z09 Encounter for follow-up examination after completed treatment for conditions other than malignant neoplasm: Secondary | ICD-10-CM

## 2021-02-20 DIAGNOSIS — Z6832 Body mass index (BMI) 32.0-32.9, adult: Secondary | ICD-10-CM | POA: Insufficient documentation

## 2021-02-20 DIAGNOSIS — E559 Vitamin D deficiency, unspecified: Secondary | ICD-10-CM | POA: Insufficient documentation

## 2021-02-20 DIAGNOSIS — E1165 Type 2 diabetes mellitus with hyperglycemia: Secondary | ICD-10-CM | POA: Insufficient documentation

## 2021-02-20 LAB — POCT GLYCOSYLATED HEMOGLOBIN (HGB A1C): Hemoglobin A1C: 6.5 % — AB (ref 4.0–5.6)

## 2021-02-20 MED ORDER — CETIRIZINE HCL 10 MG PO TABS: 10.0000 mg | ORAL_TABLET | Freq: Every day | ORAL | 11 refills | Status: DC

## 2021-02-20 NOTE — Progress Notes (Signed)
Patient [facility]   Established Patient Office Visit  Subjective:  Patient ID: Angela Henderson, female    DOB: 1991-02-08  Age: 30 y.o. MRN: [id number]  CC:  Chief Complaint  Patient presents with  . Diabetes    HPI Angela Henderson is a 30 year old female presents for Follow Up today.   Patient Active Problem List   Diagnosis Date Noted  . Body mass index (BMI) 32.0-32.9, adult 02/20/2021  . Hyperglycemia due to type 2 diabetes mellitus ([place]) 02/20/2021  . Vitamin D deficiency 02/20/2021  . Allergies 04/08/2020  . Hemoglobin A1c less than 7.0% 04/06/2020  . Insomnia 03/25/2020  . Elevated glucose 04/05/2019  . Essential hypertension 02/05/2019  . Normal vaginal delivery 05/16/2017  . Normal labor 05/15/2017  . Morbid obesity ([place]) 11/02/2015  . AKI (acute kidney injury) ([place]) 11/02/2015  . Hyperglycemia   . ANEMIA 08/06/2010  . SHORTNESS OF BREATH 08/06/2010  . FREQUENCY, URINARY 08/06/2010  . FATIGUE 05/30/2008  . ANEMIA-IRON DEFICIENCY 11/04/2007   Current Status: Since her last office visit, she is doing well with no complaints. She denies fatigue, frequent urination, blurred vision, excessive hunger, excessive thirst, weight gain, weight loss, and poor wound healing. She continues to check her feet regularly. She denies fevers, chills, fatigue, recent infections, weight loss, and night sweats. She has not had any headaches, visual changes, dizziness, and falls. No chest pain, heart palpitations, cough and shortness of breath reported. Denies GI problems such as nausea, vomiting, diarrhea, and constipation. She has no reports of blood in stools, dysuria and hematuria. No depression or anxiety reported. She is taking all medications as prescribed. She denies pain today.   Past Medical History:  Diagnosis Date  . DKA, type 2 ([place]) 1st admission 11/02/2015  . Essential hypertension 02/05/2019  . Hyperlipidemia   . Insomnia  03/2020  . Type II diabetes mellitus ([place]) dx'd 09/2015   mother and grandmother are type 1, dad type 2    Past Surgical History:  Procedure Laterality Date  . WISDOM TOOTH EXTRACTION      Family History  Problem Relation Age of Onset  . Diabetes Mother        Type I  . Hypertension Mother   . Diabetes Father   . Hypertension Father   . Diabetes Cousin        Type I  . Cancer Maternal Aunt     Social History   Socioeconomic History  . Marital status: Single    Spouse name: Not on file  . Number of children: Not on file  . Years of education: Not on file  . Highest education level: Not on file  Occupational History  . Not on file  Tobacco Use  . Smoking status: Never Smoker  . Smokeless tobacco: Never Used  Vaping Use  . Vaping Use: Never used  Substance and Sexual Activity  . Alcohol use: Yes    Alcohol/week: 0.0 standard drinks  . Drug use: No  . Sexual activity: Yes  Other Topics Concern  . Not on file  Social History Narrative  . Not on file   Social Determinants of Health   Financial Resource Strain: Not on file  Food Insecurity: Not on file  Transportation Needs: Not on file  Physical Activity: Not on file  Stress: Not on file  Social Connections: Not on file  Intimate Partner Violence: Not on file    Outpatient Medications Prior to  Visit  Medication Sig Dispense Refill  . blood glucose meter kit and supplies Dispense based on patient and insurance preference. Use up to four times daily as directed. (FOR ICD-10 E10.9, E11.9). 1 each 0  . Continuous Blood Gluc Receiver (FREESTYLE LIBRE READER) DEVI 1 Device by Does not apply route 3 (three) times daily between meals as needed. 1 Device 0  . Continuous Blood Gluc Sensor (FREESTYLE LIBRE 14 DAY SENSOR) MISC 1 Units/m2/day by Does not apply route 3 (three) times daily as needed. 2 each 11  . cyclobenzaprine (FLEXERIL) 10 MG tablet Take 1 tablet (10 mg total) by mouth 3 (three) times daily as needed for  muscle spasms. 60 tablet 3  . glucose blood test strip Use as instructed 100 each 12  . ibuprofen (ADVIL) 800 MG tablet Take 1 tablet (800 mg total) by mouth every 8 (eight) hours as needed. (Patient taking differently: Take 800 mg by mouth every 8 (eight) hours as needed for moderate pain.) 30 tablet 3  . Semaglutide (OZEMPIC, 0.25 OR 0.5 MG/DOSE, [place]) Inject into the skin every 7 (seven) days.    Marland Kitchen oxymetazoline (AFRIN NASAL SPRAY) 0.05 % nasal spray Place 1 spray into both nostrils 2 (two) times daily. (Patient not taking: Reported on 02/20/2021) 30 mL 0  . cetirizine (ZYRTEC) 10 MG tablet Take 1 tablet (10 mg total) by mouth daily. (Patient not taking: Reported on 09/11/2020) 30 tablet 11  . fluticasone (FLONASE) 50 MCG/ACT nasal spray Place 2 sprays into both nostrils daily. (Patient not taking: Reported on 09/11/2020) 16 g 6  . insulin glargine (LANTUS) 100 UNIT/ML injection Inject 15 Units into the skin at bedtime. (Patient not taking: Reported on 02/20/2021)    . metoCLOPramide (REGLAN) 10 MG tablet Take 1 tablet (10 mg total) by mouth 3 (three) times daily before meals. (Patient not taking: Reported on 02/20/2021) 30 tablet 0  . Semaglutide, 1 MG/DOSE, (OZEMPIC, 1 MG/DOSE,) 2 MG/1.5ML SOPN Inject 1 mg into the skin once a week. (Patient not taking: Reported on 10/09/2020) 1 pen 3   No facility-administered medications prior to visit.    No Known Allergies  ROS Review of Systems  Constitutional: Negative.   HENT: Negative.   Eyes: Negative.   Respiratory: Negative.   Cardiovascular: Negative.   Gastrointestinal: Positive for abdominal distention (obese).  Endocrine: Negative.   Genitourinary: Negative.   Musculoskeletal: Negative.   Skin: Negative.   Allergic/Immunologic: Negative.   Neurological: Positive for dizziness (occasional) and headaches (occasional).  Hematological: Negative.   Psychiatric/Behavioral: Negative.       Objective:    Physical Exam Vitals and nursing note  reviewed.  Constitutional:      Appearance: Normal appearance.  HENT:     Head: Normocephalic and atraumatic.     Mouth/Throat:     Mouth: Mucous membranes are moist.     Pharynx: Oropharynx is clear.  Cardiovascular:     Rate and Rhythm: Normal rate and regular rhythm.     Pulses: Normal pulses.     Heart sounds: Normal heart sounds.  Pulmonary:     Effort: Pulmonary effort is normal.     Breath sounds: Normal breath sounds.  Abdominal:     General: Bowel sounds are normal.     Palpations: Abdomen is soft.  Musculoskeletal:        General: Normal range of motion.     Cervical back: Normal range of motion and neck supple.  Skin:    General: Skin is warm  and dry.  Neurological:     General: No focal deficit present.     Mental Status: She is alert and oriented to person, place, and time.  Psychiatric:        Mood and Affect: Mood normal.        Behavior: Behavior normal.        Thought Content: Thought content normal.        Judgment: Judgment normal.    BP (!) 145/94   Pulse 63   Ht 5' 6" (1.676 m)   Wt 203 lb (92.1 kg)   SpO2 100%   BMI 32.77 kg/m  Wt Readings from Last 3 Encounters:  02/20/21 203 lb (92.1 kg)  10/09/20 200 lb 12.8 oz (91.1 kg)  09/11/20 191 lb (86.6 kg)    Health Maintenance Due  Topic Date Due  . Hepatitis C Screening  Never done  . COVID-19 Vaccine (1) Never done  . PAP SMEAR-Modifier  10/24/2019  . FOOT EXAM  11/02/2019  . URINE MICROALBUMIN  11/02/2019  . OPHTHALMOLOGY EXAM  01/01/2020    There are no preventive care reminders to display for this patient.  Lab Results  Component Value Date   TSH 2.31 12/16/2016   Lab Results  Component Value Date   WBC 5.0 09/11/2020   HGB 11.6 (L) 09/11/2020   HCT 37.8 09/11/2020   MCV 81.5 09/11/2020   PLT 283 09/11/2020   Lab Results  Component Value Date   NA 133 (L) 09/11/2020   K 4.0 09/11/2020   CO2 20 (L) 09/11/2020   GLUCOSE 385 (H) 09/11/2020   BUN 11 09/11/2020   CREATININE  0.93 09/11/2020   BILITOT 0.5 02/24/2020   ALKPHOS 44 02/24/2020   AST 15 02/24/2020   ALT 18 02/24/2020   PROT 7.8 02/24/2020   ALBUMIN 4.7 02/24/2020   CALCIUM 9.7 09/11/2020   ANIONGAP 12 09/11/2020   GFR 128.62 10/31/2016   Lab Results  Component Value Date   CHOL 131 12/16/2016   Lab Results  Component Value Date   HDL 39 (L) 12/16/2016   Lab Results  Component Value Date   LDLCALC 65 12/16/2016   Lab Results  Component Value Date   TRIG 134 12/16/2016   Lab Results  Component Value Date   CHOLHDL 3.4 12/16/2016   Lab Results  Component Value Date   HGBA1C 6.5 (A) 02/20/2021   Assessment & Plan:   1. Type 2 diabetes mellitus without complication, without long-term current use of insulin (HCC) She will continue medication as prescribed, to decrease foods/beverages high in sugars and carbs and follow Heart Healthy or DASH diet. Increase physical activity to at least 30 minutes cardio exercise daily.  - POCT glycosylated hemoglobin (Hb A1C) - cetirizine (ZYRTEC) 10 MG tablet; Take 1 tablet (10 mg total) by mouth daily.  Dispense: 30 tablet; Refill: 11  2. Hemoglobin A1c less than 7.0% Hgb A1c stable at 6.5 today. Monitor.  - cetirizine (ZYRTEC) 10 MG tablet; Take 1 tablet (10 mg total) by mouth daily.  Dispense: 30 tablet; Refill: 11  3. Seasonal allergies - cetirizine (ZYRTEC) 10 MG tablet; Take 1 tablet (10 mg total) by mouth daily.  Dispense: 30 tablet; Refill: 11  4. Follow up She will follow up in 1 year for Annual Physical and Labwork.  Meds ordered this encounter  Medications  . cetirizine (ZYRTEC) 10 MG tablet    Sig: Take 1 tablet (10 mg total) by mouth daily.    Dispense:    30 tablet    Refill:  11    Orders Placed This Encounter  Procedures  . POCT glycosylated hemoglobin (Hb A1C)    Referral Orders  No referral(s) requested today    Natalie Stroud, MSN, ANE, FNP-BC [facility] [address]  [place], [place] [address] [phone] fax  Problem List Items Addressed This Visit      Other   Hemoglobin A1c less than 7.0%   Relevant Medications   cetirizine (ZYRTEC) 10 MG tablet    Other Visit Diagnoses    Type 2 diabetes mellitus without complication, without long-term current use of insulin (HCC)    -  Primary   Relevant Medications   Semaglutide (OZEMPIC, 0.25 OR 0.5 MG/DOSE, [place])   cetirizine (ZYRTEC) 10 MG tablet   Other Relevant Orders   POCT glycosylated hemoglobin (Hb A1C) (Completed)   Seasonal allergies       Relevant Medications   cetirizine (ZYRTEC) 10 MG tablet   Follow up          Meds ordered this encounter  Medications  . cetirizine (ZYRTEC) 10 MG tablet    Sig: Take 1 tablet (10 mg total) by mouth daily.    Dispense:  30 tablet    Refill:  11    Follow-up: No follow-ups on file.    Natalie M Stroud, FNP 

## 2021-02-20 NOTE — Patient Instructions (Addendum)
Please contact your Opthalmaologist and schedule a Diabetic Eye Exam.  

## 2021-02-26 ENCOUNTER — Encounter: Payer: Self-pay | Admitting: Family Medicine

## 2021-03-22 ENCOUNTER — Other Ambulatory Visit: Payer: BC Managed Care – PPO

## 2021-03-22 ENCOUNTER — Other Ambulatory Visit: Payer: Self-pay

## 2021-03-22 DIAGNOSIS — N898 Other specified noninflammatory disorders of vagina: Secondary | ICD-10-CM

## 2021-03-27 LAB — NUSWAB VAGINITIS PLUS (VG+)
Candida albicans, NAA: NEGATIVE
Candida glabrata, NAA: NEGATIVE
Chlamydia trachomatis, NAA: NEGATIVE
Neisseria gonorrhoeae, NAA: NEGATIVE
Trich vag by NAA: NEGATIVE

## 2021-04-05 ENCOUNTER — Ambulatory Visit: Payer: BC Managed Care – PPO | Admitting: Family Medicine

## 2021-06-10 ENCOUNTER — Encounter (HOSPITAL_COMMUNITY): Payer: Self-pay | Admitting: *Deleted

## 2021-06-10 ENCOUNTER — Emergency Department (HOSPITAL_COMMUNITY)
Admission: EM | Admit: 2021-06-10 | Discharge: 2021-06-10 | Disposition: A | Discharge status: Home / Self Care | Payer: Medicaid Other | Attending: Emergency Medicine | Admitting: Emergency Medicine

## 2021-06-10 ENCOUNTER — Emergency Department (HOSPITAL_COMMUNITY)
Admission: EM | Admit: 2021-06-10 | Discharge: 2021-06-10 | Disposition: A | Discharge status: Home / Self Care | Payer: BC Managed Care – PPO | Attending: Emergency Medicine | Admitting: Emergency Medicine

## 2021-06-10 ENCOUNTER — Other Ambulatory Visit: Payer: Self-pay

## 2021-06-10 DIAGNOSIS — J029 Acute pharyngitis, unspecified: Secondary | ICD-10-CM | POA: Diagnosis not present

## 2021-06-10 DIAGNOSIS — U071 COVID-19: Secondary | ICD-10-CM | POA: Diagnosis not present

## 2021-06-10 DIAGNOSIS — Z5321 Procedure and treatment not carried out due to patient leaving prior to being seen by health care provider: Secondary | ICD-10-CM | POA: Insufficient documentation

## 2021-06-10 DIAGNOSIS — E119 Type 2 diabetes mellitus without complications: Secondary | ICD-10-CM | POA: Insufficient documentation

## 2021-06-10 DIAGNOSIS — R059 Cough, unspecified: Secondary | ICD-10-CM | POA: Insufficient documentation

## 2021-06-10 DIAGNOSIS — R5381 Other malaise: Secondary | ICD-10-CM | POA: Insufficient documentation

## 2021-06-10 DIAGNOSIS — R067 Sneezing: Secondary | ICD-10-CM | POA: Insufficient documentation

## 2021-06-10 DIAGNOSIS — Z79899 Other long term (current) drug therapy: Secondary | ICD-10-CM | POA: Insufficient documentation

## 2021-06-10 DIAGNOSIS — Z20822 Contact with and (suspected) exposure to covid-19: Secondary | ICD-10-CM | POA: Diagnosis not present

## 2021-06-10 DIAGNOSIS — I10 Essential (primary) hypertension: Secondary | ICD-10-CM | POA: Insufficient documentation

## 2021-06-10 LAB — SARS CORONAVIRUS 2 (TAT 6-24 HRS): SARS Coronavirus 2: NEGATIVE

## 2021-06-10 MED ORDER — IBUPROFEN 600 MG PO TABS: 600.0000 mg | ORAL_TABLET | Freq: Four times a day (QID) | ORAL | 0 refills | Status: DC | PRN

## 2021-06-10 MED ORDER — IBUPROFEN 200 MG PO TABS
600.0000 mg | ORAL_TABLET | Freq: Once | ORAL | Status: AC
  Administered 2021-06-10: 600 mg via ORAL
  Filled 2021-06-10: qty 3

## 2021-06-10 MED ORDER — ALBUTEROL SULFATE HFA 108 (90 BASE) MCG/ACT IN AERS
1.0000 | INHALATION_SPRAY | Freq: Four times a day (QID) | RESPIRATORY_TRACT | 0 refills | Status: AC | PRN
Start: 2021-06-10 — End: ?

## 2021-06-10 NOTE — ED Triage Notes (Signed)
Pt complains of sore throat and cough x 3 days.

## 2021-06-10 NOTE — ED Notes (Signed)
Patient decided to leave.   

## 2021-06-10 NOTE — Discharge Instructions (Addendum)
We suspect that you have covid.  Outpatient PCR test has been sent.  Return to the ER if you start having severe shortness of breath, severe nausea, vomiting, confusion.  Take ibuprofen for pain control. Consider using inhaler for the cough/chest tightness.

## 2021-06-11 ENCOUNTER — Telehealth: Payer: Self-pay

## 2021-06-11 NOTE — Telephone Encounter (Signed)
Transition Care Management Follow-up Telephone Call Date of discharge and from where: 06/10/2021-[facility] How have you been since you were released from the hospital? Patient stated she feels a lot better.  Any questions or concerns? No  Items Reviewed: Did the pt receive and understand the discharge instructions provided? Yes  Medications obtained and verified? Yes  Other? No  Any new allergies since your discharge? No  Dietary orders reviewed? N/A Do you have support at home? Yes   Home Care and Equipment/Supplies: Were home health services ordered? not applicable If so, what is the name of the agency? N/A  Has the agency set up a time to come to the patient's home? not applicable Were any new equipment or medical supplies ordered?  No What is the name of the medical supply agency? N/A Were you able to get the supplies/equipment? not applicable Do you have any questions related to the use of the equipment or supplies? No  Functional Questionnaire: (I = Independent and D = Dependent) ADLs: I  Bathing/Dressing- I  Meal Prep- I  Eating- I  Maintaining continence- I  Transferring/Ambulation- I  Managing Meds- I  Follow up appointments reviewed:  PCP Hospital f/u appt confirmed? No   Specialist Hospital f/u appt confirmed? No   Are transportation arrangements needed? No  If their condition worsens, is the pt aware to call PCP or go to the Emergency Dept.? Yes Was the patient provided with contact information for the PCP's office or ED? Yes Was to pt encouraged to call back with questions or concerns? Yes

## 2021-06-12 NOTE — ED Provider Notes (Signed)
[facility] DEPT Provider Note   CSN: [id number] Arrival date & time: 06/10/21  1125     History Chief Complaint  Patient presents with   Sore Throat    Angela Henderson is a 30 y.o. female.  HPI     Pt with hx of DM comes in with cc of sore throat. Pt ha sore throat and cough x 3 days. No n/v/f/c/dib. + malaise.   Past Medical History:  Diagnosis Date   DKA, type 2 ([place]) 1st admission 11/02/2015   Essential hypertension 02/05/2019   Hyperlipidemia    Insomnia 03/2020   Type II diabetes mellitus ([place]) dx'd 09/2015   mother and grandmother are type 1, dad type 2    Patient Active Problem List   Diagnosis Date Noted   Body mass index (BMI) 32.0-32.9, adult 02/20/2021   Hyperglycemia due to type 2 diabetes mellitus ([place]) 02/20/2021   Vitamin D deficiency 02/20/2021   Allergies 04/08/2020   Hemoglobin A1c less than 7.0% 04/06/2020   Insomnia 03/25/2020   Elevated glucose 04/05/2019   Essential hypertension 02/05/2019   Normal vaginal delivery 05/16/2017   Normal labor 05/15/2017   Morbid obesity ([place]) 11/02/2015   AKI (acute kidney injury) ([place]) 11/02/2015   Hyperglycemia    ANEMIA 08/06/2010   SHORTNESS OF BREATH 08/06/2010   FREQUENCY, URINARY 08/06/2010   FATIGUE 05/30/2008   ANEMIA-IRON DEFICIENCY 11/04/2007    Past Surgical History:  Procedure Laterality Date   WISDOM TOOTH EXTRACTION       OB History     Gravida  1   Para  1   Term  1   Preterm      AB      Living  1      SAB      IAB      Ectopic      Multiple  0   Live Births  1           Family History  Problem Relation Age of Onset   Diabetes Mother        Type I   Hypertension Mother    Diabetes Father    Hypertension Father    Diabetes Cousin        Type I   Cancer Maternal Aunt     Social History   Tobacco Use   Smoking status: Never   Smokeless tobacco: Never  Vaping Use   Vaping Use: Never used  Substance Use Topics    Alcohol use: Yes    Alcohol/week: 0.0 standard drinks   Drug use: No    Home Medications Prior to Admission medications   Medication Sig Start Date End Date Taking? Authorizing Provider  albuterol (VENTOLIN HFA) 108 (90 Base) MCG/ACT inhaler Inhale 1-2 puffs into the lungs every 6 (six) hours as needed for wheezing or shortness of breath. 06/10/21  Yes Anniece Bleiler, MD  ibuprofen (ADVIL) 600 MG tablet Take 1 tablet (600 mg total) by mouth every 6 (six) hours as needed for headache, moderate pain or fever. 06/10/21  Yes Varney Biles, MD  blood glucose meter kit and supplies Dispense based on patient and insurance preference. Use up to four times daily as directed. (FOR ICD-10 E10.9, E11.9). 02/24/20   Azzie Glatter, FNP  cetirizine (ZYRTEC) 10 MG tablet Take 1 tablet (10 mg total) by mouth daily. 02/20/21   Azzie Glatter, FNP  Continuous Blood Gluc Receiver (FREESTYLE LIBRE READER) DEVI 1 Device by Does not  apply route 3 (three) times daily between meals as needed. 04/05/19   Azzie Glatter, FNP  Continuous Blood Gluc Sensor (FREESTYLE LIBRE 14 DAY SENSOR) MISC 1 Units/m2/day by Does not apply route 3 (three) times daily as needed. 04/06/19   Azzie Glatter, FNP  cyclobenzaprine (FLEXERIL) 10 MG tablet Take 1 tablet (10 mg total) by mouth 3 (three) times daily as needed for muscle spasms. 10/09/20   Azzie Glatter, FNP  glucose blood test strip Use as instructed 09/27/18   Azzie Glatter, FNP  oxymetazoline (AFRIN NASAL SPRAY) 0.05 % nasal spray Place 1 spray into both nostrils 2 (two) times daily. Patient not taking: Reported on 02/20/2021 11/01/18   Azzie Glatter, FNP  Semaglutide (OZEMPIC, 0.25 OR 0.5 MG/DOSE, [place]) Inject into the skin every 7 (seven) days.    [provider]    Allergies    Patient has no known allergies.  Review of Systems   Review of Systems  Constitutional:  Positive for activity change.  HENT:  Positive for sore throat.   Respiratory:   Positive for cough. Negative for shortness of breath.   Cardiovascular:  Negative for chest pain.  Gastrointestinal:  Negative for nausea and vomiting.   Physical Exam Updated Vital Signs BP (!) 144/100 (BP Location: Left Arm)   Pulse 81   Temp 98.4 F (36.9 C) (Oral)   Resp 16   LMP 06/03/2021   SpO2 100%   Physical Exam Vitals and nursing note reviewed.  Constitutional:      Appearance: She is well-developed.  HENT:     Head: Atraumatic.     Mouth/Throat:     Pharynx: Posterior oropharyngeal erythema present. No oropharyngeal exudate.  Cardiovascular:     Rate and Rhythm: Normal rate.  Pulmonary:     Effort: Pulmonary effort is normal.  Musculoskeletal:     Cervical back: Normal range of motion and neck supple.  Skin:    General: Skin is warm and dry.  Neurological:     Mental Status: She is alert and oriented to person, place, and time.    ED Results / Procedures / Treatments   Labs (all labs ordered are listed, but only abnormal results are displayed) Labs Reviewed  SARS CORONAVIRUS 2 (TAT 6-24 HRS)    EKG None  Radiology No results found.  Procedures Procedures   Medications Ordered in ED Medications  ibuprofen (ADVIL) tablet 600 mg (600 mg Oral Given 06/10/21 1227)    ED Course  I have reviewed the triage vital signs and the nursing notes.  Pertinent labs & imaging results that were available during my care of the patient were reviewed by me and considered in my medical decision making (see chart for details).    MDM Rules/Calculators/A&P                          Pt with sore throat and cough. Lung exam is clear. No exudates. Doubt pna or strep. Suspect possible covid.  Non toxic appearing. Discussed return precations. Will treat conservatively.   Angela Henderson was evaluated in Emergency Department on 06/12/2021 for the symptoms described in the history of present illness. She was evaluated in the context of the global COVID-19 pandemic,  which necessitated consideration that the patient might be at risk for infection with the SARS-CoV-2 virus that causes COVID-19. Institutional protocols and algorithms that pertain to the evaluation of patients at risk for COVID-19 are in  a state of rapid change based on information released by regulatory bodies including the CDC and federal and state organizations. These policies and algorithms were followed during the patient's care in the ED.    Final Clinical Impression(s) / ED Diagnoses Final diagnoses:  Suspected COVID-19 virus infection    Rx / DC Orders ED Discharge Orders          Ordered    albuterol (VENTOLIN HFA) 108 (90 Base) MCG/ACT inhaler  Every 6 hours PRN        06/10/21 1219    ibuprofen (ADVIL) 600 MG tablet  Every 6 hours PRN        06/10/21 1219             Varney Biles, MD 06/12/21 2126

## 2021-06-24 ENCOUNTER — Emergency Department (HOSPITAL_COMMUNITY)
Admission: EM | Admit: 2021-06-24 | Discharge: 2021-06-24 | Disposition: A | Discharge status: Home / Self Care | Payer: BC Managed Care – PPO | Attending: Emergency Medicine | Admitting: Emergency Medicine

## 2021-06-24 ENCOUNTER — Encounter (HOSPITAL_COMMUNITY): Payer: Self-pay | Admitting: *Deleted

## 2021-06-24 DIAGNOSIS — R21 Rash and other nonspecific skin eruption: Secondary | ICD-10-CM | POA: Insufficient documentation

## 2021-06-24 DIAGNOSIS — E1165 Type 2 diabetes mellitus with hyperglycemia: Secondary | ICD-10-CM | POA: Insufficient documentation

## 2021-06-24 DIAGNOSIS — G43809 Other migraine, not intractable, without status migrainosus: Secondary | ICD-10-CM | POA: Diagnosis not present

## 2021-06-24 DIAGNOSIS — I10 Essential (primary) hypertension: Secondary | ICD-10-CM | POA: Insufficient documentation

## 2021-06-24 DIAGNOSIS — Z20822 Contact with and (suspected) exposure to covid-19: Secondary | ICD-10-CM | POA: Diagnosis not present

## 2021-06-24 DIAGNOSIS — R739 Hyperglycemia, unspecified: Secondary | ICD-10-CM

## 2021-06-24 DIAGNOSIS — R519 Headache, unspecified: Secondary | ICD-10-CM | POA: Diagnosis not present

## 2021-06-24 DIAGNOSIS — G43909 Migraine, unspecified, not intractable, without status migrainosus: Secondary | ICD-10-CM | POA: Diagnosis not present

## 2021-06-24 LAB — URINALYSIS, ROUTINE W REFLEX MICROSCOPIC
Bacteria, UA: NONE SEEN
Bilirubin Urine: NEGATIVE
Glucose, UA: >=500 mg/dL — AB
Ketones, ur: 5 mg/dL — AB
Leukocytes,Ua: NEGATIVE
Nitrite: NEGATIVE
Protein, ur: NEGATIVE mg/dL
Specific Gravity, Urine: 1.026 (ref 1.005–1.030)
pH: 5 (ref 5.0–8.0)

## 2021-06-24 LAB — CBC WITH DIFFERENTIAL/PLATELET
Abs Immature Granulocytes: 0.04 10*3/uL (ref 0.00–0.07)
Basophils Absolute: 0 10*3/uL (ref 0.0–0.1)
Basophils Relative: 0 %
Eosinophils Absolute: 0 10*3/uL (ref 0.0–0.5)
Eosinophils Relative: 0 %
HCT: 36.7 % (ref 36.0–46.0)
Hemoglobin: 11.1 g/dL — ABNORMAL LOW (ref 12.0–15.0)
Immature Granulocytes: 1 %
Lymphocytes Relative: 23 %
Lymphs Abs: 1.6 10*3/uL (ref 0.7–4.0)
MCH: 24.3 pg — ABNORMAL LOW (ref 26.0–34.0)
MCHC: 30.2 g/dL (ref 30.0–36.0)
MCV: 80.3 fL (ref 80.0–100.0)
Monocytes Absolute: 0.6 10*3/uL (ref 0.1–1.0)
Monocytes Relative: 8 %
Neutro Abs: 4.7 10*3/uL (ref 1.7–7.7)
Neutrophils Relative %: 68 %
Platelets: 285 10*3/uL (ref 150–400)
RBC: 4.57 MIL/uL (ref 3.87–5.11)
RDW: 14.6 % (ref 11.5–15.5)
WBC: 6.9 10*3/uL (ref 4.0–10.5)
nRBC: 0 % (ref 0.0–0.2)

## 2021-06-24 LAB — RESP PANEL BY RT-PCR (FLU A&B, COVID) ARPGX2
Influenza A by PCR: NEGATIVE
Influenza B by PCR: NEGATIVE
SARS Coronavirus 2 by RT PCR: NEGATIVE

## 2021-06-24 LAB — BASIC METABOLIC PANEL
Anion gap: 13 (ref 5–15)
BUN: 8 mg/dL (ref 6–20)
CO2: 23 mmol/L (ref 22–32)
Calcium: 9.4 mg/dL (ref 8.9–10.3)
Chloride: 101 mmol/L (ref 98–111)
Creatinine, Ser: 0.9 mg/dL (ref 0.44–1.00)
GFR, Estimated: >60 mL/min (ref >60)
Glucose, Bld: 323 mg/dL — ABNORMAL HIGH (ref 70–99)
Potassium: 4.2 mmol/L (ref 3.5–5.1)
Sodium: 137 mmol/L (ref 135–145)

## 2021-06-24 LAB — PREGNANCY, URINE: Preg Test, Ur: NEGATIVE

## 2021-06-24 LAB — CBG MONITORING, ED: Glucose-Capillary: 305 mg/dL — ABNORMAL HIGH (ref 70–99)

## 2021-06-24 MED ORDER — DIPHENHYDRAMINE HCL 50 MG/ML IJ SOLN
12.5000 mg | Freq: Once | INTRAMUSCULAR | Status: AC
  Administered 2021-06-24: 12.5 mg via INTRAVENOUS
  Filled 2021-06-24: qty 1

## 2021-06-24 MED ORDER — ACETAMINOPHEN 500 MG PO TABS
1000.0000 mg | ORAL_TABLET | Freq: Once | ORAL | Status: AC
  Administered 2021-06-24: 1000 mg via ORAL
  Filled 2021-06-24: qty 2

## 2021-06-24 MED ORDER — METOCLOPRAMIDE HCL 5 MG/ML IJ SOLN
10.0000 mg | Freq: Once | INTRAMUSCULAR | Status: AC
  Administered 2021-06-24: 10 mg via INTRAVENOUS
  Filled 2021-06-24: qty 2

## 2021-06-24 MED ORDER — SODIUM CHLORIDE 0.9 % IV BOLUS
1000.0000 mL | Freq: Once | INTRAVENOUS | Status: AC
  Administered 2021-06-24: 1000 mL via INTRAVENOUS

## 2021-06-24 MED ORDER — DOXYCYCLINE HYCLATE 100 MG PO CAPS: 100.0000 mg | ORAL_CAPSULE | Freq: Two times a day (BID) | ORAL | 0 refills | Status: AC

## 2021-06-24 NOTE — Discharge Instructions (Addendum)
We will call you if your Lyme serology is abnormal.  In the meantime we will treat you with antibiotics due to this possible bite. Take the doxycycline as prescribed. Follow-up with your primary care provider. Your blood sugar was in the 300s today but you have no signs of severe acidemia.  You will need to follow-up with your primary care provider about this. Return to the ER if you start to experience worsening headache, neck stiffness, chest pain, shortness of breath or rash.

## 2021-06-24 NOTE — ED Provider Notes (Signed)
[facility] DEPT Provider Note   CSN: [id number] Arrival date & time: 06/24/21  0901     History Chief Complaint  Patient presents with   Headache    Angela Henderson is a 30 y.o. female with a past medical history of hypertension, hyperlipidemia, diabetes presenting to the ED with a chief complaint of headache.  For the past few days has been experiencing headache with associated photophobia.  Denies any nausea or vomiting.  States that she had a similar episode when she was pregnant with her child.  She is denying possibility of pregnancy at this time.  She has tried NSAIDs with only minimal improvement in her symptoms.  She does not realize that she was febrile.  Denies any neck stiffness, sick contacts with similar symptoms, abdominal pain, chest pain, numbness in arms or legs or vision changes.  HPI     Past Medical History:  Diagnosis Date   DKA, type 2 ([place]) 1st admission 11/02/2015   Essential hypertension 02/05/2019   Hyperlipidemia    Insomnia 03/2020   Type II diabetes mellitus ([place]) dx'd 09/2015   mother and grandmother are type 1, dad type 2    Patient Active Problem List   Diagnosis Date Noted   Body mass index (BMI) 32.0-32.9, adult 02/20/2021   Hyperglycemia due to type 2 diabetes mellitus ([place]) 02/20/2021   Vitamin D deficiency 02/20/2021   Allergies 04/08/2020   Hemoglobin A1c less than 7.0% 04/06/2020   Insomnia 03/25/2020   Elevated glucose 04/05/2019   Essential hypertension 02/05/2019   Normal vaginal delivery 05/16/2017   Normal labor 05/15/2017   Morbid obesity ([place]) 11/02/2015   AKI (acute kidney injury) ([place]) 11/02/2015   Hyperglycemia    ANEMIA 08/06/2010   SHORTNESS OF BREATH 08/06/2010   FREQUENCY, URINARY 08/06/2010   FATIGUE 05/30/2008   ANEMIA-IRON DEFICIENCY 11/04/2007    Past Surgical History:  Procedure Laterality Date   WISDOM TOOTH EXTRACTION       OB History     Gravida  1   Para  1    Term  1   Preterm      AB      Living  1      SAB      IAB      Ectopic      Multiple  0   Live Births  1           Family History  Problem Relation Age of Onset   Diabetes Mother        Type I   Hypertension Mother    Diabetes Father    Hypertension Father    Diabetes Cousin        Type I   Cancer Maternal Aunt     Social History   Tobacco Use   Smoking status: Never   Smokeless tobacco: Never  Vaping Use   Vaping Use: Never used  Substance Use Topics   Alcohol use: Yes    Alcohol/week: 0.0 standard drinks   Drug use: No    Home Medications Prior to Admission medications   Medication Sig Start Date End Date Taking? Authorizing Provider  doxycycline (VIBRAMYCIN) 100 MG capsule Take 1 capsule (100 mg total) by mouth 2 (two) times daily for 14 days. 06/24/21 07/08/21 Yes Andersyn Fragoso, PA-C  albuterol (VENTOLIN HFA) 108 (90 Base) MCG/ACT inhaler Inhale 1-2 puffs into the lungs every 6 (six) hours as needed for wheezing or shortness of breath. 06/10/21  Varney Biles, MD  blood glucose meter kit and supplies Dispense based on patient and insurance preference. Use up to four times daily as directed. (FOR ICD-10 E10.9, E11.9). 02/24/20   Azzie Glatter, FNP  cetirizine (ZYRTEC) 10 MG tablet Take 1 tablet (10 mg total) by mouth daily. 02/20/21   Azzie Glatter, FNP  Continuous Blood Gluc Receiver (FREESTYLE LIBRE READER) DEVI 1 Device by Does not apply route 3 (three) times daily between meals as needed. 04/05/19   Azzie Glatter, FNP  Continuous Blood Gluc Sensor (FREESTYLE LIBRE 14 DAY SENSOR) MISC 1 Units/m2/day by Does not apply route 3 (three) times daily as needed. 04/06/19   Azzie Glatter, FNP  cyclobenzaprine (FLEXERIL) 10 MG tablet Take 1 tablet (10 mg total) by mouth 3 (three) times daily as needed for muscle spasms. 10/09/20   Azzie Glatter, FNP  glucose blood test strip Use as instructed 09/27/18   Azzie Glatter, FNP  ibuprofen (ADVIL)  600 MG tablet Take 1 tablet (600 mg total) by mouth every 6 (six) hours as needed for headache, moderate pain or fever. 06/10/21   Varney Biles, MD  oxymetazoline (AFRIN NASAL SPRAY) 0.05 % nasal spray Place 1 spray into both nostrils 2 (two) times daily. Patient not taking: Reported on 02/20/2021 11/01/18   Azzie Glatter, FNP  OZEMPIC, 0.25 OR 0.5 MG/DOSE, 2 MG/1.5ML SOPN Inject 0.5 mg into the skin once a week. 05/09/21   [provider]  Semaglutide (OZEMPIC, 0.25 OR 0.5 MG/DOSE, [place]) Inject into the skin every 7 (seven) days.    [provider]    Allergies    Patient has no known allergies.  Review of Systems   Review of Systems  Constitutional:  Negative for appetite change, chills and fever.  HENT:  Negative for ear pain, rhinorrhea, sneezing and sore throat.   Eyes:  Positive for photophobia. Negative for visual disturbance.  Respiratory:  Negative for cough, chest tightness, shortness of breath and wheezing.   Cardiovascular:  Negative for chest pain and palpitations.  Gastrointestinal:  Negative for abdominal pain, blood in stool, constipation, diarrhea, nausea and vomiting.  Genitourinary:  Negative for dysuria, hematuria and urgency.  Musculoskeletal:  Negative for myalgias.  Skin:  Positive for wound. Negative for rash.  Neurological:  Positive for headaches. Negative for dizziness, weakness and light-headedness.   Physical Exam Updated Vital Signs BP 127/87   Pulse 83   Temp 98.6 F (37 C) (Oral)   Resp 18   LMP 06/21/2021   SpO2 98%   Physical Exam Vitals and nursing note reviewed.  Constitutional:      General: She is not in acute distress.    Appearance: She is well-developed.  HENT:     Head: Normocephalic and atraumatic.     Nose: Nose normal.  Eyes:     General: No scleral icterus.       Left eye: No discharge.     Conjunctiva/sclera: Conjunctivae normal.  Cardiovascular:     Rate and Rhythm: Normal rate and regular rhythm.      Heart sounds: Normal heart sounds. No murmur heard.   No friction rub. No gallop.  Pulmonary:     Effort: Pulmonary effort is normal. No respiratory distress.     Breath sounds: Normal breath sounds.  Abdominal:     General: Bowel sounds are normal. There is no distension.     Palpations: Abdomen is soft.     Tenderness: There is no abdominal  tenderness. There is no guarding.  Musculoskeletal:        General: Normal range of motion.     Cervical back: Normal range of motion and neck supple.  Skin:    General: Skin is warm and dry.     Findings: No rash.     Comments: Wound to right calf.  Neurological:     Mental Status: She is alert and oriented to person, place, and time.     Cranial Nerves: No cranial nerve deficit or facial asymmetry.     Motor: No abnormal muscle tone.     Coordination: Coordination normal.     Comments: Pupils reactive. No facial asymmetry noted. Cranial nerves appear grossly intact. Sensation intact to light touch on face, BUE and BLE. Strength 5/5 in BUE and BLE.     ED Results / Procedures / Treatments   Labs (all labs ordered are listed, but only abnormal results are displayed) Labs Reviewed  BASIC METABOLIC PANEL - Abnormal; Notable for the following components:      Result Value   Glucose, Bld 323 (*)    All other components within normal limits  CBC WITH DIFFERENTIAL/PLATELET - Abnormal; Notable for the following components:   Hemoglobin 11.1 (*)    MCH 24.3 (*)    All other components within normal limits  URINALYSIS, ROUTINE W REFLEX MICROSCOPIC - Abnormal; Notable for the following components:   APPearance TURBID (*)    Glucose, UA >=500 (*)    Hgb urine dipstick LARGE (*)    Ketones, ur 5 (*)    All other components within normal limits  CBG MONITORING, ED - Abnormal; Notable for the following components:   Glucose-Capillary 305 (*)    All other components within normal limits  RESP PANEL BY RT-PCR (FLU A&B, COVID) [id number]  PREGNANCY,  URINE  LYME DISEASE SEROLOGY W/REFLEX    EKG None  Radiology No results found.  Procedures Procedures   Medications Ordered in ED Medications  sodium chloride 0.9 % bolus 1,000 mL (1,000 mLs Intravenous New Bag/Given 06/24/21 1102)  metoCLOPramide (REGLAN) injection 10 mg (10 mg Intravenous Given 06/24/21 1048)  diphenhydrAMINE (BENADRYL) injection 12.5 mg (12.5 mg Intravenous Given 06/24/21 1048)  acetaminophen (TYLENOL) tablet 1,000 mg (1,000 mg Oral Given 06/24/21 1047)    ED Course  I have reviewed the triage vital signs and the nursing notes.  Pertinent labs & imaging results that were available during my care of the patient were reviewed by me and considered in my medical decision making (see chart for details).  Clinical Course as of 06/24/21 1439  Mon Jun 24, 2021  1101 SARS Coronavirus 2 by RT PCR: NEGATIVE [HK]  1226 Creatinine: 0.90 [HK]  1226 Anion gap: 13 [HK]  1226 CO2: 23 [HK]  1226 WBC: 6.9 [HK]  1342 Temp: 98.6 F (37 C) [HK]    Clinical Course User Index [HK] Delia Heady, PA-C   MDM Rules/Calculators/A&P                           SHEALEIGH DUNSTAN was evaluated in Emergency Department on 06/24/21 for the symptoms described in the history of present illness. He/she was evaluated in the context of the global COVID-19 pandemic, which necessitated consideration that the patient might be at risk for infection with the SARS-CoV-2 virus that causes COVID-19. Institutional protocols and algorithms that pertain to the evaluation of patients at risk for COVID-19 are in a state of  rapid change based on information released by regulatory bodies including the CDC and federal and state organizations. These policies and algorithms were followed during the patient's care in the ED.  30 year old female presenting to the ED with a chief complaint of headache.  Symptoms began a few days ago reports associated photophobia.  Denies any nausea or vomiting.  Denies any neck  stiffness, numbness, vision changes, chest pain, shortness of breath, injuries or falls.  On exam patient without any neurological deficits.  She is febrile on arrival here and recheck she has a temperature of 100.6.  No facial asymmetry.  No numbness or weakness noted.  No meningeal signs.  Will check lab work including pregnancy test and COVID test and attempt to treat what appears to be migraine.  Covid test is negative.  CBG is elevated at 305.  Pregnancy test is negative.  On recheck patient's fever has improved but she denies any other viral URI symptoms or possible source of infection.  When asked about tick bite states that she noticed a region in her right calf on 06/22/2021 that was irritated.  Will obtain Lyme serology and treat with doxycycline. There are no headache characteristics that are lateralizing or concerning for increased ICP, infectious or vascular cause of her symptoms.  She reports significant improvement here.  We will have her follow with PCP regarding her hyperglycemia.  Return precautions given.    Patient is hemodynamically stable, in NAD, and able to ambulate in the ED. Evaluation does not show pathology that would require ongoing emergent intervention or inpatient treatment. I explained the diagnosis to the patient. Pain has been managed and has no complaints prior to discharge. Patient is comfortable with above plan and is stable for discharge at this time. All questions were answered prior to disposition. Strict return precautions for returning to the ED were discussed. Encouraged follow up with PCP.   An After Visit Summary was printed and given to the patient.   Portions of this note were generated with [employer]. Dictation errors may occur despite best attempts at proofreading.  Final Clinical Impression(s) / ED Diagnoses Final diagnoses:  Other migraine without status migrainosus, not intractable  Rash and nonspecific skin eruption  Hyperglycemia     Rx / DC Orders ED Discharge Orders          Ordered    doxycycline (VIBRAMYCIN) 100 MG capsule  2 times daily        06/24/21 [address] PA-C 06/24/21 1439    Arnaldo Natal, MD 06/24/21 [phone]

## 2021-06-24 NOTE — ED Triage Notes (Signed)
Pt complains of headache since yesterday. Reports sensitive to light. No nausea.

## 2021-06-25 ENCOUNTER — Telehealth: Payer: Self-pay

## 2021-06-25 LAB — LYME DISEASE SEROLOGY W/REFLEX: Lyme Total Antibody EIA: NEGATIVE

## 2021-06-25 NOTE — Telephone Encounter (Signed)
Transition Care Management Unsuccessful Follow-up Telephone Call  Date of discharge and from where:  06/24/2021 from [facility]  Attempts:  1st Attempt  Reason for unsuccessful TCM follow-up call:  Left voice message

## 2021-06-26 NOTE — Telephone Encounter (Signed)
Transition Care Management Unsuccessful Follow-up Telephone Call  Date of discharge and from where:  06/24/2021 from [facility]  Attempts:  2nd Attempt  Reason for unsuccessful TCM follow-up call:  Left voice message    

## 2021-06-27 NOTE — Telephone Encounter (Signed)
Transition Care Management Unsuccessful Follow-up Telephone Call  Date of discharge and from where:  06/24/2021 from [facility]  Attempts:  3rd Attempt  Reason for unsuccessful TCM follow-up call:  Unable to reach patient

## 2021-08-05 ENCOUNTER — Encounter (HOSPITAL_COMMUNITY): Payer: Self-pay | Admitting: *Deleted

## 2021-08-05 ENCOUNTER — Emergency Department (HOSPITAL_COMMUNITY)
Admission: EM | Admit: 2021-08-05 | Discharge: 2021-08-05 | Disposition: A | Discharge status: Home / Self Care | Payer: BLUE CROSS/BLUE SHIELD | Attending: Emergency Medicine | Admitting: Emergency Medicine

## 2021-08-05 DIAGNOSIS — E1110 Type 2 diabetes mellitus with ketoacidosis without coma: Secondary | ICD-10-CM | POA: Diagnosis not present

## 2021-08-05 DIAGNOSIS — Z794 Long term (current) use of insulin: Secondary | ICD-10-CM | POA: Insufficient documentation

## 2021-08-05 DIAGNOSIS — I10 Essential (primary) hypertension: Secondary | ICD-10-CM | POA: Diagnosis not present

## 2021-08-05 DIAGNOSIS — N898 Other specified noninflammatory disorders of vagina: Secondary | ICD-10-CM | POA: Diagnosis not present

## 2021-08-05 DIAGNOSIS — R739 Hyperglycemia, unspecified: Secondary | ICD-10-CM

## 2021-08-05 DIAGNOSIS — R202 Paresthesia of skin: Secondary | ICD-10-CM | POA: Diagnosis not present

## 2021-08-05 DIAGNOSIS — E1165 Type 2 diabetes mellitus with hyperglycemia: Secondary | ICD-10-CM | POA: Insufficient documentation

## 2021-08-05 DIAGNOSIS — R200 Anesthesia of skin: Secondary | ICD-10-CM

## 2021-08-05 LAB — CBC WITH DIFFERENTIAL/PLATELET
Abs Immature Granulocytes: 0.04 10*3/uL (ref 0.00–0.07)
Basophils Absolute: 0 10*3/uL (ref 0.0–0.1)
Basophils Relative: 0 %
Eosinophils Absolute: 0.1 10*3/uL (ref 0.0–0.5)
Eosinophils Relative: 2 %
HCT: 38 % (ref 36.0–46.0)
Hemoglobin: 11.4 g/dL — ABNORMAL LOW (ref 12.0–15.0)
Immature Granulocytes: 1 %
Lymphocytes Relative: 31 %
Lymphs Abs: 1.3 10*3/uL (ref 0.7–4.0)
MCH: 24.4 pg — ABNORMAL LOW (ref 26.0–34.0)
MCHC: 30 g/dL (ref 30.0–36.0)
MCV: 81.4 fL (ref 80.0–100.0)
Monocytes Absolute: 0.4 10*3/uL (ref 0.1–1.0)
Monocytes Relative: 10 %
Neutro Abs: 2.3 10*3/uL (ref 1.7–7.7)
Neutrophils Relative %: 56 %
Platelets: 247 10*3/uL (ref 150–400)
RBC: 4.67 MIL/uL (ref 3.87–5.11)
RDW: 15.7 % — ABNORMAL HIGH (ref 11.5–15.5)
WBC: 4.1 10*3/uL (ref 4.0–10.5)
nRBC: 0 % (ref 0.0–0.2)

## 2021-08-05 LAB — BASIC METABOLIC PANEL
Anion gap: 9 (ref 5–15)
BUN: 12 mg/dL (ref 6–20)
CO2: 23 mmol/L (ref 22–32)
Calcium: 9.2 mg/dL (ref 8.9–10.3)
Chloride: 101 mmol/L (ref 98–111)
Creatinine, Ser: 0.65 mg/dL (ref 0.44–1.00)
GFR, Estimated: >60 mL/min (ref >60)
Glucose, Bld: 416 mg/dL — ABNORMAL HIGH (ref 70–99)
Potassium: 4 mmol/L (ref 3.5–5.1)
Sodium: 133 mmol/L — ABNORMAL LOW (ref 135–145)

## 2021-08-05 LAB — BLOOD GAS, VENOUS
Acid-base deficit: 3.7 mmol/L — ABNORMAL HIGH (ref 0.0–2.0)
Bicarbonate: 21.8 mmol/L (ref 20.0–28.0)
O2 Saturation: 60.5 %
Patient temperature: 98.6
pCO2, Ven: 43.9 mmHg — ABNORMAL LOW (ref 44.0–60.0)
pH, Ven: 7.317 (ref 7.250–7.430)
pO2, Ven: 35.6 mmHg (ref 32.0–45.0)

## 2021-08-05 LAB — BETA-HYDROXYBUTYRIC ACID: Beta-Hydroxybutyric Acid: 1.33 mmol/L — ABNORMAL HIGH (ref 0.05–0.27)

## 2021-08-05 LAB — CBG MONITORING, ED: Glucose-Capillary: 442 mg/dL — ABNORMAL HIGH (ref 70–99)

## 2021-08-05 LAB — OSMOLALITY: Osmolality: 302 mOsm/kg — ABNORMAL HIGH (ref 275–295)

## 2021-08-05 LAB — I-STAT BETA HCG BLOOD, ED (MC, WL, AP ONLY): I-stat hCG, quantitative: <5 m[IU]/mL (ref <5)

## 2021-08-05 NOTE — ED Triage Notes (Signed)
Pt complains of left leg numbness since yesterday, also has dry mouth. She reports blood sugar was 419 this morning, hx of diabetes but has not taken meds in a couple weeks.

## 2021-08-05 NOTE — Discharge Instructions (Addendum)
The work-up in the ER reveals elevated blood sugar with mild dehydration. Make sure you hydrate well, take your medications and check your sugars frequently.  For the numbness, it is unclear why you are having numbness just in the front of your leg.  As discussed, it might be prudent to see neurologist if the symptoms do not resolve in the next week or if you start developing other areas of numbness.  Return to the ER if your numbness starts spreading over the entire left leg or entire left side, or you start developing left-sided weakness, slurred speech, sudden vision change.

## 2021-08-05 NOTE — ED Provider Notes (Signed)
[facility] DEPT Provider Note   CSN: [id number] Arrival date & time: 08/05/21  0754     History Chief Complaint  Patient presents with   Numbness    Angela Henderson is a 30 y.o. female.  HPI    30 year old female comes in with chief complaint of numbness.  Patient reports that she started having left leg numbness, just in the front side of the leg yesterday.  There is numbness has been constant.  No tingling sensation.  She also denies any weakness on the left side.   She has history of diabetes, hypertension, hyperlipidemia.  No complications from these conditions.  Patient has not been diagnosed with neuropathy or gastroparesis.  She does admit to noncompliance with her medications.  No history of strokes.  Patient denies any focal weakness on the left side, numbness over the left lower extremity above the knee or the back of the leg.  She also denies any numbness to her foot on the left side and has no other neurologic symptoms.  There is no family history of conditions like multiple sclerosis.  She does indicate that few weeks back she had some tingling sensation in her arm that resolved in a week.  Past Medical History:  Diagnosis Date   DKA, type 2 ([place]) 1st admission 11/02/2015   Essential hypertension 02/05/2019   Hyperlipidemia    Insomnia 03/2020   Type II diabetes mellitus ([place]) dx'd 09/2015   mother and grandmother are type 1, dad type 2    Patient Active Problem List   Diagnosis Date Noted   Body mass index (BMI) 32.0-32.9, adult 02/20/2021   Hyperglycemia due to type 2 diabetes mellitus ([place]) 02/20/2021   Vitamin D deficiency 02/20/2021   Allergies 04/08/2020   Hemoglobin A1c less than 7.0% 04/06/2020   Insomnia 03/25/2020   Elevated glucose 04/05/2019   Essential hypertension 02/05/2019   Normal vaginal delivery 05/16/2017   Normal labor 05/15/2017   Morbid obesity ([place]) 11/02/2015   AKI (acute kidney injury) ([place])  11/02/2015   Hyperglycemia    ANEMIA 08/06/2010   SHORTNESS OF BREATH 08/06/2010   FREQUENCY, URINARY 08/06/2010   FATIGUE 05/30/2008   ANEMIA-IRON DEFICIENCY 11/04/2007    Past Surgical History:  Procedure Laterality Date   WISDOM TOOTH EXTRACTION       OB History     Gravida  1   Para  1   Term  1   Preterm      AB      Living  1      SAB      IAB      Ectopic      Multiple  0   Live Births  1           Family History  Problem Relation Age of Onset   Diabetes Mother        Type I   Hypertension Mother    Diabetes Father    Hypertension Father    Diabetes Cousin        Type I   Cancer Maternal Aunt     Social History   Tobacco Use   Smoking status: Never   Smokeless tobacco: Never  Vaping Use   Vaping Use: Never used  Substance Use Topics   Alcohol use: Yes    Alcohol/week: 0.0 standard drinks   Drug use: No    Home Medications Prior to Admission medications   Medication Sig Start Date End Date Taking?  Authorizing Provider  albuterol (VENTOLIN HFA) 108 (90 Base) MCG/ACT inhaler Inhale 1-2 puffs into the lungs every 6 (six) hours as needed for wheezing or shortness of breath. 06/10/21   Varney Biles, MD  blood glucose meter kit and supplies Dispense based on patient and insurance preference. Use up to four times daily as directed. (FOR ICD-10 E10.9, E11.9). 02/24/20   Azzie Glatter, FNP  cetirizine (ZYRTEC) 10 MG tablet Take 1 tablet (10 mg total) by mouth daily. 02/20/21   Azzie Glatter, FNP  Continuous Blood Gluc Receiver (FREESTYLE LIBRE READER) DEVI 1 Device by Does not apply route 3 (three) times daily between meals as needed. 04/05/19   Azzie Glatter, FNP  Continuous Blood Gluc Sensor (FREESTYLE LIBRE 14 DAY SENSOR) MISC 1 Units/m2/day by Does not apply route 3 (three) times daily as needed. 04/06/19   Azzie Glatter, FNP  cyclobenzaprine (FLEXERIL) 10 MG tablet Take 1 tablet (10 mg total) by mouth 3 (three) times daily as  needed for muscle spasms. 10/09/20   Azzie Glatter, FNP  glucose blood test strip Use as instructed 09/27/18   Azzie Glatter, FNP  ibuprofen (ADVIL) 600 MG tablet Take 1 tablet (600 mg total) by mouth every 6 (six) hours as needed for headache, moderate pain or fever. 06/10/21   Varney Biles, MD  oxymetazoline (AFRIN NASAL SPRAY) 0.05 % nasal spray Place 1 spray into both nostrils 2 (two) times daily. Patient not taking: Reported on 02/20/2021 11/01/18   Azzie Glatter, FNP  OZEMPIC, 0.25 OR 0.5 MG/DOSE, 2 MG/1.5ML SOPN Inject 0.5 mg into the skin once a week. 05/09/21   [provider]  Semaglutide (OZEMPIC, 0.25 OR 0.5 MG/DOSE, [place]) Inject into the skin every 7 (seven) days.    [provider]    Allergies    Patient has no known allergies.  Review of Systems   Review of Systems  Constitutional:  Positive for activity change.  Eyes:  Negative for visual disturbance.  Skin:  Negative for rash.  Neurological:  Positive for numbness.  All other systems reviewed and are negative.  Physical Exam Updated Vital Signs BP 117/86   Pulse 68   Temp 98 F (36.7 C) (Oral)   Resp 17   Ht 5' 6"  (1.676 m)   Wt 86.2 kg   LMP 07/18/2021   SpO2 100%   BMI 30.67 kg/m   Physical Exam Vitals and nursing note reviewed.  Constitutional:      Appearance: She is well-developed.  HENT:     Head: Atraumatic.  Cardiovascular:     Rate and Rhythm: Normal rate.  Pulmonary:     Effort: Pulmonary effort is normal.  Musculoskeletal:     Cervical back: Normal range of motion and neck supple.  Skin:    General: Skin is warm and dry.  Neurological:     Mental Status: She is alert and oriented to person, place, and time.     Cranial Nerves: No cranial nerve deficit.     Sensory: Sensory deficit present.     Motor: No weakness.     Coordination: Coordination normal.     Gait: Gait normal.     Deep Tendon Reflexes: Reflexes normal.    ED Results / Procedures /  Treatments   Labs (all labs ordered are listed, but only abnormal results are displayed) Labs Reviewed  BASIC METABOLIC PANEL - Abnormal; Notable for the following components:      Result Value  Sodium 133 (*)    Glucose, Bld 416 (*)    All other components within normal limits  CBC WITH DIFFERENTIAL/PLATELET - Abnormal; Notable for the following components:   Hemoglobin 11.4 (*)    MCH 24.4 (*)    RDW 15.7 (*)    All other components within normal limits  BETA-HYDROXYBUTYRIC ACID - Abnormal; Notable for the following components:   Beta-Hydroxybutyric Acid 1.33 (*)    All other components within normal limits  BLOOD GAS, VENOUS - Abnormal; Notable for the following components:   pCO2, Ven 43.9 (*)    Acid-base deficit 3.7 (*)    All other components within normal limits  CBG MONITORING, ED - Abnormal; Notable for the following components:   Glucose-Capillary 442 (*)    All other components within normal limits  URINALYSIS, ROUTINE W REFLEX MICROSCOPIC  OSMOLALITY  I-STAT BETA HCG BLOOD, ED ([facility], [facility], [facility] ONLY)  CBG MONITORING, ED    EKG None  Radiology No results found.  Procedures Procedures   Medications Ordered in ED Medications - No data to display  ED Course  I have reviewed the triage vital signs and the nursing notes.  Pertinent labs & imaging results that were available during my care of the patient were reviewed by me and considered in my medical decision making (see chart for details).    MDM Rules/Calculators/A&P                            30 year old female comes in with chief complaint of focal, anterior left leg numbness.  Symptoms started more than 24 hours ago and are constant.  Symptoms are not progressing, they are not ascending and no new symptoms such as focal weakness, slurred speech have developed.  Patient is only 59.  She does have metabolic risk factors like diabetes, hypertension, hyperlipidemia but this atypical numbness seems less  likely to be because of a stroke right now.  Clinically, this does not appear to be neuropathy.  She does indicate having some arm numbness few weeks back, there is no family history of MS but her neuromuscular demyelinating process is also in the differential.  For now, patient and I have concluded after shared decision making that she will follow-up with neurology as an outpatient if her symptoms do not resolve and she will return to the ER if the symptoms start progressing or she develops new symptoms.  She is also been advised to make sure to hydrate well and to start taking her antihyperglycemic agents. Final Clinical Impression(s) / ED Diagnoses Final diagnoses:  Numbness and tingling of left leg  Hyperglycemia    Rx / DC Orders ED Discharge Orders     None        Varney Biles, MD 08/05/21 1050

## 2021-08-05 NOTE — ED Notes (Signed)
D/c paperwork reviewed with pt, including f/u care. Pt verbalized understanding, ambulatory to ED exit.

## 2021-08-06 ENCOUNTER — Telehealth: Payer: Self-pay

## 2021-08-06 NOTE — Telephone Encounter (Signed)
Transition Care Management Unsuccessful Follow-up Telephone Call  Date of discharge and from where:  08/05/2021-[facility]  Attempts:  1st Attempt  Reason for unsuccessful TCM follow-up call:  Left voice message

## 2021-08-08 ENCOUNTER — Emergency Department (HOSPITAL_COMMUNITY)
Admission: EM | Admit: 2021-08-08 | Discharge: 2021-08-08 | Disposition: A | Discharge status: Home / Self Care | Payer: BLUE CROSS/BLUE SHIELD | Attending: Emergency Medicine | Admitting: Emergency Medicine

## 2021-08-08 ENCOUNTER — Encounter (HOSPITAL_COMMUNITY): Payer: Self-pay

## 2021-08-08 ENCOUNTER — Other Ambulatory Visit: Payer: Self-pay

## 2021-08-08 DIAGNOSIS — R112 Nausea with vomiting, unspecified: Secondary | ICD-10-CM | POA: Diagnosis not present

## 2021-08-08 DIAGNOSIS — Z794 Long term (current) use of insulin: Secondary | ICD-10-CM | POA: Insufficient documentation

## 2021-08-08 DIAGNOSIS — E1110 Type 2 diabetes mellitus with ketoacidosis without coma: Secondary | ICD-10-CM | POA: Diagnosis not present

## 2021-08-08 DIAGNOSIS — R197 Diarrhea, unspecified: Secondary | ICD-10-CM | POA: Diagnosis not present

## 2021-08-08 DIAGNOSIS — I10 Essential (primary) hypertension: Secondary | ICD-10-CM | POA: Insufficient documentation

## 2021-08-08 LAB — CBC WITH DIFFERENTIAL/PLATELET
Abs Immature Granulocytes: 0.03 10*3/uL (ref 0.00–0.07)
Basophils Absolute: 0 10*3/uL (ref 0.0–0.1)
Basophils Relative: 0 %
Eosinophils Absolute: 0 10*3/uL (ref 0.0–0.5)
Eosinophils Relative: 1 %
HCT: 38.5 % (ref 36.0–46.0)
Hemoglobin: 11.6 g/dL — ABNORMAL LOW (ref 12.0–15.0)
Immature Granulocytes: 0 %
Lymphocytes Relative: 15 %
Lymphs Abs: 1 10*3/uL (ref 0.7–4.0)
MCH: 24.4 pg — ABNORMAL LOW (ref 26.0–34.0)
MCHC: 30.1 g/dL (ref 30.0–36.0)
MCV: 81.1 fL (ref 80.0–100.0)
Monocytes Absolute: 0.5 10*3/uL (ref 0.1–1.0)
Monocytes Relative: 7 %
Neutro Abs: 5.2 10*3/uL (ref 1.7–7.7)
Neutrophils Relative %: 77 %
Platelets: 254 10*3/uL (ref 150–400)
RBC: 4.75 MIL/uL (ref 3.87–5.11)
RDW: 15.5 % (ref 11.5–15.5)
WBC: 6.8 10*3/uL (ref 4.0–10.5)
nRBC: 0 % (ref 0.0–0.2)

## 2021-08-08 LAB — COMPREHENSIVE METABOLIC PANEL
ALT: 31 U/L (ref 0–44)
AST: 25 U/L (ref 15–41)
Albumin: 4.3 g/dL (ref 3.5–5.0)
Alkaline Phosphatase: 47 U/L (ref 38–126)
Anion gap: 9 (ref 5–15)
BUN: 9 mg/dL (ref 6–20)
CO2: 23 mmol/L (ref 22–32)
Calcium: 9.6 mg/dL (ref 8.9–10.3)
Chloride: 103 mmol/L (ref 98–111)
Creatinine, Ser: 0.85 mg/dL (ref 0.44–1.00)
GFR, Estimated: >60 mL/min (ref >60)
Glucose, Bld: 426 mg/dL — ABNORMAL HIGH (ref 70–99)
Potassium: 4.2 mmol/L (ref 3.5–5.1)
Sodium: 135 mmol/L (ref 135–145)
Total Bilirubin: 0.9 mg/dL (ref 0.3–1.2)
Total Protein: 8.5 g/dL — ABNORMAL HIGH (ref 6.5–8.1)

## 2021-08-08 LAB — CBG MONITORING, ED
Glucose-Capillary: 380 mg/dL — ABNORMAL HIGH (ref 70–99)
Glucose-Capillary: 272 mg/dL — ABNORMAL HIGH (ref 70–99)

## 2021-08-08 LAB — URINALYSIS, ROUTINE W REFLEX MICROSCOPIC
Bilirubin Urine: NEGATIVE
Glucose, UA: >=500 mg/dL — AB
Hgb urine dipstick: NEGATIVE
Ketones, ur: >80 mg/dL — AB
Leukocytes,Ua: NEGATIVE
Nitrite: NEGATIVE
Protein, ur: NEGATIVE mg/dL
Specific Gravity, Urine: 1.015 (ref 1.005–1.030)
pH: 6 (ref 5.0–8.0)

## 2021-08-08 LAB — URINALYSIS, MICROSCOPIC (REFLEX)

## 2021-08-08 LAB — HCG, QUANTITATIVE, PREGNANCY: hCG, Beta Chain, Quant, S: <1 m[IU]/mL (ref <5)

## 2021-08-08 MED ORDER — METOCLOPRAMIDE HCL 5 MG/ML IJ SOLN
10.0000 mg | Freq: Once | INTRAMUSCULAR | Status: AC
  Administered 2021-08-08: 10 mg via INTRAVENOUS
  Filled 2021-08-08: qty 2

## 2021-08-08 MED ORDER — SODIUM CHLORIDE 0.9 % IV BOLUS
1000.0000 mL | Freq: Once | INTRAVENOUS | Status: AC
  Administered 2021-08-08: 1000 mL via INTRAVENOUS

## 2021-08-08 NOTE — ED Triage Notes (Signed)
Pt complains of vomiting and diarrhea since last night 

## 2021-08-08 NOTE — ED Notes (Signed)
Pt ambulatory without assistance.  

## 2021-08-08 NOTE — Discharge Instructions (Addendum)
Follow-up with your primary care doctor in the next 24-48 hours for further evaluation.  Make sure you are drinking plenty of fluids and staying hydrated.   Continue to take your Ozempic medication, although the medication might be contributing to your nausea, vomiting and diarrhea.    Return to the ED if you experience any of the following:  The pain does not go away or becomes severe Temperature above 101 develops Persistent vomiting occurs (multiple episodes) The pain becomes localized to portions of the abdomen. The right side could  possibly be appendicitis. In an adult, the left lower portion of the abdomen could  be colitis or diverticulitis. There is blood present in stools or vomit Return also if you develop chest pain, difficulty breathing, dizziness or fainting,  or  become confused or other worsening symptoms.

## 2021-08-08 NOTE — ED Notes (Signed)
Pt ambulatory with steady gait and w/o assistance to and from the bathroom Pt did not provide urine sample Pt informed once again of need for urine sample Will continue to monitor

## 2021-08-08 NOTE — Telephone Encounter (Signed)
Transition Care Management Unsuccessful Follow-up Telephone Call  Date of discharge and from where:  08/05/2021 from [facility]   Attempts:  2nd Attempt  Reason for unsuccessful TCM follow-up call:  Left voice message

## 2021-08-08 NOTE — ED Provider Notes (Signed)
[facility] DEPT Provider Note   CSN: [id number] Arrival date & time: 08/08/21  0457     History Chief Complaint  Patient presents with   Emesis   Diarrhea    Angela Henderson is a 30 y.o. female.  30 y.o female with a PMH of DKA, DM, HTN presents to the ED with a chief complaint of nausea, vomiting, diarrhea since 10 PM last night.  Denies any prior episodes like these.  States that she has had about 5 episodes of nonbilious, nonbloody emesis.  In addition, she has had multiple episodes of diarrhea without any blood in her stool.  Reports the last meal she ate were noodles along with muffins.  She recently began taking her insulin again, she is currently taking Ozempic 0.5.  Has not tried taking any medication for improvement in her symptoms.  She denies any sick contacts.  Denies any abdominal pain, fever, shortness of breath, urinary symptoms.  Last menstrual period 09/01.    The history is provided by the patient and medical records.  Emesis Severity:  Mild Duration:  9 hours Timing:  Constant Number of daily episodes:  5 Quality:  Bilious material Able to tolerate:  Liquids Associated symptoms: diarrhea   Associated symptoms: no abdominal pain, no fever, no headaches, no myalgias and no sore throat   Diarrhea Associated symptoms: vomiting   Associated symptoms: no abdominal pain, no fever, no headaches and no myalgias       Past Medical History:  Diagnosis Date   DKA, type 2 ([place]) 1st admission 11/02/2015   Essential hypertension 02/05/2019   Hyperlipidemia    Insomnia 03/2020   Type II diabetes mellitus ([place]) dx'd 09/2015   mother and grandmother are type 1, dad type 2    Patient Active Problem List   Diagnosis Date Noted   Body mass index (BMI) 32.0-32.9, adult 02/20/2021   Hyperglycemia due to type 2 diabetes mellitus ([place]) 02/20/2021   Vitamin D deficiency 02/20/2021   Allergies 04/08/2020   Hemoglobin A1c less than 7.0%  04/06/2020   Insomnia 03/25/2020   Elevated glucose 04/05/2019   Essential hypertension 02/05/2019   Normal vaginal delivery 05/16/2017   Normal labor 05/15/2017   Morbid obesity ([place]) 11/02/2015   AKI (acute kidney injury) ([place]) 11/02/2015   Hyperglycemia    ANEMIA 08/06/2010   SHORTNESS OF BREATH 08/06/2010   FREQUENCY, URINARY 08/06/2010   FATIGUE 05/30/2008   ANEMIA-IRON DEFICIENCY 11/04/2007    Past Surgical History:  Procedure Laterality Date   WISDOM TOOTH EXTRACTION       OB History     Gravida  1   Para  1   Term  1   Preterm      AB      Living  1      SAB      IAB      Ectopic      Multiple  0   Live Births  1           Family History  Problem Relation Age of Onset   Diabetes Mother        Type I   Hypertension Mother    Diabetes Father    Hypertension Father    Diabetes Cousin        Type I   Cancer Maternal Aunt     Social History   Tobacco Use   Smoking status: Never   Smokeless tobacco: Never  Vaping Use  Vaping Use: Never used  Substance Use Topics   Alcohol use: Yes    Alcohol/week: 0.0 standard drinks   Drug use: No    Home Medications Prior to Admission medications   Medication Sig Start Date End Date Taking? Authorizing Provider  albuterol (VENTOLIN HFA) 108 (90 Base) MCG/ACT inhaler Inhale 1-2 puffs into the lungs every 6 (six) hours as needed for wheezing or shortness of breath. 06/10/21  Yes Nanavati, Ankit, MD  cyclobenzaprine (FLEXERIL) 10 MG tablet Take 1 tablet (10 mg total) by mouth 3 (three) times daily as needed for muscle spasms. 10/09/20  Yes Azzie Glatter, FNP  OZEMPIC, 0.25 OR 0.5 MG/DOSE, 2 MG/1.5ML SOPN Inject 0.5 mg into the skin once a week. Tuesday's 05/09/21  Yes [provider]  blood glucose meter kit and supplies Dispense based on patient and insurance preference. Use up to four times daily as directed. (FOR ICD-10 E10.9, E11.9). 02/24/20   Azzie Glatter, FNP  Continuous Blood  Gluc Receiver (FREESTYLE LIBRE READER) DEVI 1 Device by Does not apply route 3 (three) times daily between meals as needed. 04/05/19   Azzie Glatter, FNP  Continuous Blood Gluc Sensor (FREESTYLE LIBRE 14 DAY SENSOR) MISC 1 Units/m2/day by Does not apply route 3 (three) times daily as needed. 04/06/19   Azzie Glatter, FNP  glucose blood test strip Use as instructed 09/27/18   Azzie Glatter, FNP  ibuprofen (ADVIL) 600 MG tablet Take 1 tablet (600 mg total) by mouth every 6 (six) hours as needed for headache, moderate pain or fever. Patient not taking: Reported on 08/08/2021 06/10/21   Varney Biles, MD    Allergies    Patient has no known allergies.  Review of Systems   Review of Systems  Constitutional:  Negative for fever.  HENT:  Negative for sore throat.   Respiratory:  Negative for shortness of breath.   Cardiovascular:  Negative for chest pain.  Gastrointestinal:  Positive for diarrhea, nausea and vomiting. Negative for abdominal pain.  Genitourinary:  Negative for flank pain.  Musculoskeletal:  Negative for back pain and myalgias.  Neurological:  Negative for light-headedness and headaches.  All other systems reviewed and are negative.  Physical Exam Updated Vital Signs BP 105/71 (BP Location: Left Arm)   Pulse 71   Temp 98 F (36.7 C) (Oral)   Resp 15   Ht 5' 6"  (1.676 m)   Wt 86.2 kg   LMP 07/18/2021   SpO2 100%   BMI 30.67 kg/m   Physical Exam Vitals and nursing note reviewed.  Constitutional:      Appearance: Normal appearance. She is not ill-appearing.     Comments: Non toxic appearance  HENT:     Head: Normocephalic and atraumatic.     Mouth/Throat:     Mouth: Mucous membranes are moist.  Eyes:     Pupils: Pupils are equal, round, and reactive to light.  Cardiovascular:     Rate and Rhythm: Normal rate.  Pulmonary:     Effort: Pulmonary effort is normal.     Breath sounds: No wheezing.     Comments: Lungs are clear to auscultation. Abdominal:      General: Abdomen is flat.     Tenderness: There is no abdominal tenderness. There is no right CVA tenderness or left CVA tenderness.     Comments: Soft, decreased bowel sounds and nontender to palpation  Musculoskeletal:     Cervical back: Normal range of motion and neck supple.  Skin:    General: Skin is warm and dry.  Neurological:     Mental Status: She is alert and oriented to person, place, and time.    ED Results / Procedures / Treatments   Labs (all labs ordered are listed, but only abnormal results are displayed) Labs Reviewed  CBC WITH DIFFERENTIAL/PLATELET - Abnormal; Notable for the following components:      Result Value   Hemoglobin 11.6 (*)    MCH 24.4 (*)    All other components within normal limits  COMPREHENSIVE METABOLIC PANEL - Abnormal; Notable for the following components:   Glucose, Bld 426 (*)    Total Protein 8.5 (*)    All other components within normal limits  URINALYSIS, ROUTINE W REFLEX MICROSCOPIC - Abnormal; Notable for the following components:   Glucose, UA >=500 (*)    Ketones, ur >80 (*)    All other components within normal limits  URINALYSIS, MICROSCOPIC (REFLEX) - Abnormal; Notable for the following components:   Bacteria, UA RARE (*)    All other components within normal limits  CBG MONITORING, ED - Abnormal; Notable for the following components:   Glucose-Capillary 380 (*)    All other components within normal limits  CBG MONITORING, ED - Abnormal; Notable for the following components:   Glucose-Capillary 272 (*)    All other components within normal limits  HCG, QUANTITATIVE, PREGNANCY    EKG None  Radiology No results found.  Procedures Procedures   Medications Ordered in ED Medications  sodium chloride 0.9 % bolus 1,000 mL (0 mLs Intravenous Stopped 08/08/21 1016)  metoCLOPramide (REGLAN) injection 10 mg (10 mg Intravenous Given 08/08/21 0841)    ED Course  I have reviewed the triage vital signs and the nursing  notes.  Pertinent labs & imaging results that were available during my care of the patient were reviewed by me and considered in my medical decision making (see chart for details).  Clinical Course as of 08/08/21 1156  Thu Aug 08, 2021  1138 HCG, Beta Chain, Quant, S: <1 [JS]    Clinical Course User Index [JS] Janeece Fitting, PA-C   MDM Rules/Calculators/A&P    Patient presents to the ED with a chief complaint of nausea, vomiting, diarrhea since last night.  Recently began her Ozempic medication 2 days ago.  According to her record she had noncompliance with this in the past.  Reports no prior episodes similar to these today.  Has not taken any medication for improvement in her symptoms.  During primary evaluation patient's vitals on arrival are overall stable without any hypoxia or tachycardia.  She is nontoxic appearance on evaluation, actively eating ice chips as review is occurring.  She is with out any abdominal pain, her abdomen is soft nontender to palpation with decreased bowel sounds.  No CVA tenderness bilaterally.  Lungs are clear to auscultation.  Moves all upper and lower extremities.  She denies any urinary symptoms.  Labs obtained during her wait.  CBC with no leukocytosis, hemoglobin is overall stable slightly decreased though.  CMP without any electrolyte derangement despite multiple episodes of vomiting.  Glucose slightly elevated at 426, has not taken her insulin due to episodes of vomiting. No anion gap, ketones present in her urine however low suspicion for DKA at this time.  Denies any sick contacts or fevers.  Evaluated in the ED 3 days ago for multiple complaints which she reports these have improved.  We discussed symptomatic treatment at this time, will reevaluate after  medication has been trialed. Urine without any signs of infection contributing to her vomiting. Suspicion that symptoms began when she resumed her medication Ozempic as these are commons side effects.    Serial abdominal exams continue to be benign, no focal tenderness and continues to eat ice chips while in the ED. Do not feel imaging is warranted at this time.   11:55 AM patient reevaluated by me pregnancy test is negative.  She is stable for discharge at this time.  Return precautions discussed at length.  Portions of this note were generated with [employer]. Dictation errors may occur despite best attempts at proofreading.  Final Clinical Impression(s) / ED Diagnoses Final diagnoses:  Nausea vomiting and diarrhea    Rx / DC Orders ED Discharge Orders     None        Janeece Fitting, PA-C 08/08/21 [place], MD 08/09/21 [phone]

## 2021-08-09 ENCOUNTER — Telehealth: Payer: Self-pay

## 2021-08-09 NOTE — Telephone Encounter (Signed)
Transition Care Management Unsuccessful Follow-up Telephone Call  Date of discharge and from where:  08/08/2021-[facility]  Attempts:  1st Attempt  Reason for unsuccessful TCM follow-up call:  Left voice message

## 2021-08-09 NOTE — Telephone Encounter (Signed)
Transition Care Management Unsuccessful Follow-up Telephone Call  Date of discharge and from where:  08/05/2021-[facility]  Attempts:  3rd Attempt  Reason for unsuccessful TCM follow-up call:  Left voice message

## 2021-08-12 NOTE — Telephone Encounter (Signed)
Transition Care Management Unsuccessful Follow-up Telephone Call  Date of discharge and from where:  08/08/2021 from [facility]  Attempts:  2nd Attempt  Reason for unsuccessful TCM follow-up call:  Left voice message

## 2021-08-14 NOTE — Telephone Encounter (Signed)
Transition Care Management Unsuccessful Follow-up Telephone Call  Date of discharge and from where:  08/08/2021-[facility]  Attempts:  3rd Attempt  Reason for unsuccessful TCM follow-up call:  Left voice message

## 2021-10-18 ENCOUNTER — Other Ambulatory Visit: Payer: Self-pay

## 2021-10-18 ENCOUNTER — Emergency Department (HOSPITAL_COMMUNITY)
Admission: EM | Admit: 2021-10-18 | Discharge: 2021-10-18 | Disposition: A | Discharge status: Home / Self Care | Payer: BC Managed Care – PPO | Attending: Emergency Medicine | Admitting: Emergency Medicine

## 2021-10-18 ENCOUNTER — Encounter (HOSPITAL_COMMUNITY): Payer: Self-pay

## 2021-10-18 DIAGNOSIS — D649 Anemia, unspecified: Secondary | ICD-10-CM | POA: Diagnosis not present

## 2021-10-18 DIAGNOSIS — J069 Acute upper respiratory infection, unspecified: Secondary | ICD-10-CM | POA: Insufficient documentation

## 2021-10-18 DIAGNOSIS — Z20822 Contact with and (suspected) exposure to covid-19: Secondary | ICD-10-CM | POA: Insufficient documentation

## 2021-10-18 DIAGNOSIS — E1165 Type 2 diabetes mellitus with hyperglycemia: Secondary | ICD-10-CM | POA: Insufficient documentation

## 2021-10-18 DIAGNOSIS — R000 Tachycardia, unspecified: Secondary | ICD-10-CM | POA: Insufficient documentation

## 2021-10-18 DIAGNOSIS — B9789 Other viral agents as the cause of diseases classified elsewhere: Secondary | ICD-10-CM | POA: Diagnosis not present

## 2021-10-18 DIAGNOSIS — R519 Headache, unspecified: Secondary | ICD-10-CM | POA: Diagnosis not present

## 2021-10-18 DIAGNOSIS — I10 Essential (primary) hypertension: Secondary | ICD-10-CM | POA: Diagnosis not present

## 2021-10-18 LAB — CBC WITH DIFFERENTIAL/PLATELET
Abs Immature Granulocytes: 0.06 10*3/uL (ref 0.00–0.07)
Basophils Absolute: 0 10*3/uL (ref 0.0–0.1)
Basophils Relative: 0 %
Eosinophils Absolute: 0.2 10*3/uL (ref 0.0–0.5)
Eosinophils Relative: 2 %
HCT: 33.3 % — ABNORMAL LOW (ref 36.0–46.0)
Hemoglobin: 9.9 g/dL — ABNORMAL LOW (ref 12.0–15.0)
Immature Granulocytes: 1 %
Lymphocytes Relative: 26 %
Lymphs Abs: 1.8 10*3/uL (ref 0.7–4.0)
MCH: 24.6 pg — ABNORMAL LOW (ref 26.0–34.0)
MCHC: 29.7 g/dL — ABNORMAL LOW (ref 30.0–36.0)
MCV: 82.6 fL (ref 80.0–100.0)
Monocytes Absolute: 0.6 10*3/uL (ref 0.1–1.0)
Monocytes Relative: 9 %
Neutro Abs: 4.2 10*3/uL (ref 1.7–7.7)
Neutrophils Relative %: 62 %
Platelets: 423 10*3/uL — ABNORMAL HIGH (ref 150–400)
RBC: 4.03 MIL/uL (ref 3.87–5.11)
RDW: 13.9 % (ref 11.5–15.5)
WBC: 6.8 10*3/uL (ref 4.0–10.5)
nRBC: 0 % (ref 0.0–0.2)

## 2021-10-18 LAB — RESP PANEL BY RT-PCR (FLU A&B, COVID) ARPGX2
Influenza A by PCR: NEGATIVE
Influenza B by PCR: NEGATIVE
SARS Coronavirus 2 by RT PCR: NEGATIVE

## 2021-10-18 LAB — URINALYSIS, ROUTINE W REFLEX MICROSCOPIC
Bilirubin Urine: NEGATIVE
Glucose, UA: NEGATIVE mg/dL
Hgb urine dipstick: NEGATIVE
Ketones, ur: NEGATIVE mg/dL
Leukocytes,Ua: NEGATIVE
Nitrite: NEGATIVE
Protein, ur: NEGATIVE mg/dL
Specific Gravity, Urine: 1.015 (ref 1.005–1.030)
pH: 5 (ref 5.0–8.0)

## 2021-10-18 LAB — BASIC METABOLIC PANEL
Anion gap: 9 (ref 5–15)
BUN: 11 mg/dL (ref 6–20)
CO2: 26 mmol/L (ref 22–32)
Calcium: 8.8 mg/dL — ABNORMAL LOW (ref 8.9–10.3)
Chloride: 101 mmol/L (ref 98–111)
Creatinine, Ser: 0.76 mg/dL (ref 0.44–1.00)
GFR, Estimated: >60 mL/min (ref >60)
Glucose, Bld: 103 mg/dL — ABNORMAL HIGH (ref 70–99)
Potassium: 3.8 mmol/L (ref 3.5–5.1)
Sodium: 136 mmol/L (ref 135–145)

## 2021-10-18 LAB — PREGNANCY, URINE: Preg Test, Ur: NEGATIVE

## 2021-10-18 MED ORDER — CORICIDIN HBP 10-325-2 MG PO TABS: 1.0000 | ORAL_TABLET | Freq: Two times a day (BID) | ORAL | 0 refills | Status: AC | PRN

## 2021-10-18 MED ORDER — KETOROLAC TROMETHAMINE 30 MG/ML IJ SOLN
30.0000 mg | Freq: Once | INTRAMUSCULAR | Status: AC
  Administered 2021-10-18: 30 mg via INTRAVENOUS
  Filled 2021-10-18: qty 1

## 2021-10-18 MED ORDER — IBUPROFEN 600 MG PO TABS: 600.0000 mg | ORAL_TABLET | Freq: Four times a day (QID) | ORAL | 0 refills | Status: AC | PRN

## 2021-10-18 MED ORDER — SODIUM CHLORIDE 0.9 % IV BOLUS
1000.0000 mL | Freq: Once | INTRAVENOUS | Status: AC
  Administered 2021-10-18: 1000 mL via INTRAVENOUS

## 2021-10-18 NOTE — ED Triage Notes (Signed)
Patient c/o having a headache x 1 week. Patient c/o light sensitivity and slight dizziness. Patient denies any n/v or blurred vision

## 2021-10-18 NOTE — Discharge Instructions (Addendum)
Use a neti pot to clean out your sinuses.  Your hemoglobin is slightly low at 9.9.  Eat food high in iron and take iron pills around your period.

## 2021-10-18 NOTE — ED Provider Notes (Signed)
[facility] DEPT Provider Note   CSN: [id number] Arrival date & time: 10/18/21  4818     History Chief Complaint  Patient presents with   Headache    Angela Henderson is a 30 y.o. female.  Pt presents to the ED today with headache.  Pt said she has had headaches for 1 week.  She denies f/c.  No n/v.  She has sinus pressure and congestion.  She denies sick contacts.      Past Medical History:  Diagnosis Date   DKA, type 2 ([place]) 1st admission 11/02/2015   Essential hypertension 02/05/2019   Hyperlipidemia    Insomnia 03/2020   Type II diabetes mellitus ([place]) dx'd 09/2015   mother and grandmother are type 1, dad type 2    Patient Active Problem List   Diagnosis Date Noted   Body mass index (BMI) 32.0-32.9, adult 02/20/2021   Hyperglycemia due to type 2 diabetes mellitus ([place]) 02/20/2021   Vitamin D deficiency 02/20/2021   Allergies 04/08/2020   Hemoglobin A1c less than 7.0% 04/06/2020   Insomnia 03/25/2020   Elevated glucose 04/05/2019   Essential hypertension 02/05/2019   Normal vaginal delivery 05/16/2017   Normal labor 05/15/2017   Morbid obesity ([place]) 11/02/2015   AKI (acute kidney injury) ([place]) 11/02/2015   Hyperglycemia    ANEMIA 08/06/2010   SHORTNESS OF BREATH 08/06/2010   FREQUENCY, URINARY 08/06/2010   FATIGUE 05/30/2008   ANEMIA-IRON DEFICIENCY 11/04/2007    Past Surgical History:  Procedure Laterality Date   WISDOM TOOTH EXTRACTION       OB History     Gravida  1   Para  1   Term  1   Preterm      AB      Living  1      SAB      IAB      Ectopic      Multiple  0   Live Births  1           Family History  Problem Relation Age of Onset   Diabetes Mother        Type I   Hypertension Mother    Diabetes Father    Hypertension Father    Diabetes Cousin        Type I   Cancer Maternal Aunt     Social History   Tobacco Use   Smoking status: Never   Smokeless tobacco: Never  Vaping  Use   Vaping Use: Never used  Substance Use Topics   Alcohol use: Yes    Alcohol/week: 0.0 standard drinks   Drug use: No    Home Medications Prior to Admission medications   Medication Sig Start Date End Date Taking? Authorizing Provider  DM-APAP-CPM (CORICIDIN HBP) 10-325-2 MG TABS Take 1 tablet by mouth 2 (two) times daily as needed (cold symptoms). 10/18/21  Yes Isla Pence, MD  ibuprofen (ADVIL) 600 MG tablet Take 1 tablet (600 mg total) by mouth every 6 (six) hours as needed. 10/18/21  Yes Isla Pence, MD  albuterol (VENTOLIN HFA) 108 (90 Base) MCG/ACT inhaler Inhale 1-2 puffs into the lungs every 6 (six) hours as needed for wheezing or shortness of breath. 06/10/21   Varney Biles, MD  blood glucose meter kit and supplies Dispense based on patient and insurance preference. Use up to four times daily as directed. (FOR ICD-10 E10.9, E11.9). 02/24/20   Azzie Glatter, FNP  Continuous Blood Gluc Receiver (FREESTYLE [place]  READER) DEVI 1 Device by Does not apply route 3 (three) times daily between meals as needed. 04/05/19   Azzie Glatter, FNP  Continuous Blood Gluc Sensor (FREESTYLE LIBRE 14 DAY SENSOR) MISC 1 Units/m2/day by Does not apply route 3 (three) times daily as needed. 04/06/19   Azzie Glatter, FNP  cyclobenzaprine (FLEXERIL) 10 MG tablet Take 1 tablet (10 mg total) by mouth 3 (three) times daily as needed for muscle spasms. 10/09/20   Azzie Glatter, FNP  glucose blood test strip Use as instructed 09/27/18   Azzie Glatter, FNP  OZEMPIC, 0.25 OR 0.5 MG/DOSE, 2 MG/1.5ML SOPN Inject 0.5 mg into the skin once a week. Tuesday's 05/09/21   [provider]    Allergies    Patient has no known allergies.  Review of Systems   Review of Systems  Neurological:  Positive for headaches.  All other systems reviewed and are negative.  Physical Exam Updated Vital Signs BP 134/74 (BP Location: Left Arm)   Pulse 75   Temp 98.2 F (36.8 C) (Oral)   Resp 18    Ht 5' 6"  (1.676 m)   Wt 88.5 kg   LMP 10/11/2021   SpO2 100%   BMI 31.47 kg/m   Physical Exam Vitals and nursing note reviewed.  Constitutional:      Appearance: She is well-developed.  HENT:     Head: Normocephalic and atraumatic.     Mouth/Throat:     Mouth: Mucous membranes are dry.  Eyes:     Extraocular Movements: Extraocular movements intact.     Pupils: Pupils are equal, round, and reactive to light.  Cardiovascular:     Rate and Rhythm: Regular rhythm. Tachycardia present.  Pulmonary:     Effort: Pulmonary effort is normal.     Breath sounds: Normal breath sounds.  Abdominal:     General: Bowel sounds are normal.     Palpations: Abdomen is soft.  Musculoskeletal:        General: Normal range of motion.     Cervical back: Normal range of motion and neck supple.  Skin:    General: Skin is warm.  Neurological:     Mental Status: She is alert and oriented to person, place, and time.  Psychiatric:        Mood and Affect: Mood normal.        Speech: Speech normal.        Behavior: Behavior normal.    ED Results / Procedures / Treatments   Labs (all labs ordered are listed, but only abnormal results are displayed) Labs Reviewed  BASIC METABOLIC PANEL - Abnormal; Notable for the following components:      Result Value   Glucose, Bld 103 (*)    Calcium 8.8 (*)    All other components within normal limits  CBC WITH DIFFERENTIAL/PLATELET - Abnormal; Notable for the following components:   Hemoglobin 9.9 (*)    HCT 33.3 (*)    MCH 24.6 (*)    MCHC 29.7 (*)    Platelets 423 (*)    All other components within normal limits  RESP PANEL BY RT-PCR (FLU A&B, COVID) [id number]  URINALYSIS, ROUTINE W REFLEX MICROSCOPIC  PREGNANCY, URINE    EKG None  Radiology No results found.  Procedures Procedures   Medications Ordered in ED Medications  sodium chloride 0.9 % bolus 1,000 mL (1,000 mLs Intravenous New Bag/Given 10/18/21 1049)  ketorolac (TORADOL) 30 MG/ML  injection 30 mg (30 mg Intravenous  Given 10/18/21 1048)    ED Course  I have reviewed the triage vital signs and the nursing notes.  Pertinent labs & imaging results that were available during my care of the patient were reviewed by me and considered in my medical decision making (see chart for details).    MDM Rules/Calculators/A&P                           Pt is feeling better after sx.  Pt is negative for flu/covid.  She is stable for d/c.  She is to return if worse.  F/u with pcp. Final Clinical Impression(s) / ED Diagnoses Final diagnoses:  Viral upper respiratory tract infection  Anemia, unspecified type    Rx / DC Orders ED Discharge Orders          Ordered    ibuprofen (ADVIL) 600 MG tablet  Every 6 hours PRN        10/18/21 1200    DM-APAP-CPM (CORICIDIN HBP) 10-325-2 MG TABS  2 times daily PRN        10/18/21 1200             Isla Pence, MD 10/18/21 1202

## 2021-10-21 ENCOUNTER — Telehealth: Payer: Self-pay

## 2021-10-21 NOTE — Telephone Encounter (Signed)
Transition Care Management Follow-up Telephone Call Date of discharge and from where: 10/18/2021 from [facility] How have you been since you were released from the hospital? Pt stated that she is feeling a lot better today and did not have any questions or concerns at this time.  Any questions or concerns? No  Items Reviewed: Did the pt receive and understand the discharge instructions provided? Yes  Medications obtained and verified? Yes  Other? No  Any new allergies since your discharge? No  Dietary orders reviewed? No Do you have support at home? Yes   Functional Questionnaire: (I = Independent and D = Dependent) ADLs: I  Bathing/Dressing- I  Meal Prep- I  Eating- I  Maintaining continence- I  Transferring/Ambulation- I  Managing Meds- I  Follow up appointments reviewed:  PCP Hospital f/u appt confirmed? No   Specialist Hospital f/u appt confirmed? No   Are transportation arrangements needed? No  If their condition worsens, is the pt aware to call PCP or go to the Emergency Dept.? Yes Was the patient provided with contact information for the PCP's office or ED? Yes Was to pt encouraged to call back with questions or concerns? Yes

## 2021-11-15 ENCOUNTER — Ambulatory Visit: Payer: Self-pay | Admitting: Neurology

## 2022-02-20 ENCOUNTER — Encounter: Payer: BC Managed Care – PPO | Admitting: Nurse Practitioner

## 2022-02-27 ENCOUNTER — Encounter: Payer: Medicaid Other | Admitting: Nurse Practitioner

## 2022-02-28 ENCOUNTER — Ambulatory Visit (INDEPENDENT_AMBULATORY_CARE_PROVIDER_SITE_OTHER): Payer: Medicaid Other | Admitting: Nurse Practitioner

## 2022-02-28 ENCOUNTER — Encounter: Payer: Self-pay | Admitting: Nurse Practitioner

## 2022-02-28 VITALS — BP 149/92 | HR 66 | Temp 99.1°F | Ht 66.0 in | Wt 199.2 lb

## 2022-02-28 DIAGNOSIS — E119 Type 2 diabetes mellitus without complications: Secondary | ICD-10-CM | POA: Diagnosis not present

## 2022-02-28 DIAGNOSIS — Z124 Encounter for screening for malignant neoplasm of cervix: Secondary | ICD-10-CM | POA: Diagnosis not present

## 2022-02-28 DIAGNOSIS — N760 Acute vaginitis: Secondary | ICD-10-CM | POA: Diagnosis not present

## 2022-02-28 DIAGNOSIS — Z6832 Body mass index (BMI) 32.0-32.9, adult: Secondary | ICD-10-CM | POA: Diagnosis not present

## 2022-02-28 DIAGNOSIS — Z113 Encounter for screening for infections with a predominantly sexual mode of transmission: Secondary | ICD-10-CM | POA: Diagnosis not present

## 2022-02-28 DIAGNOSIS — Z0000 Encounter for general adult medical examination without abnormal findings: Secondary | ICD-10-CM | POA: Diagnosis not present

## 2022-02-28 DIAGNOSIS — R82998 Other abnormal findings in urine: Secondary | ICD-10-CM | POA: Diagnosis not present

## 2022-02-28 LAB — POCT URINALYSIS DIP (CLINITEK)
Bilirubin, UA: NEGATIVE
Glucose, UA: NEGATIVE mg/dL
Ketones, POC UA: NEGATIVE mg/dL
Nitrite, UA: NEGATIVE
POC PROTEIN,UA: NEGATIVE
Spec Grav, UA: 1.025 (ref 1.010–1.025)
Urobilinogen, UA: 0.2 E.U./dL
pH, UA: 5.5 (ref 5.0–8.0)

## 2022-02-28 LAB — POCT GLYCOSYLATED HEMOGLOBIN (HGB A1C)
HbA1c POC (<> result, manual entry): 7.8 % (ref 4.0–5.6)
HbA1c, POC (controlled diabetic range): 7.8 % — AB (ref 0.0–7.0)
HbA1c, POC (prediabetic range): 7.8 % — AB (ref 5.7–6.4)
Hemoglobin A1C: 7.8 % — AB (ref 4.0–5.6)

## 2022-02-28 MED ORDER — METFORMIN HCL 500 MG PO TABS: 500.0000 mg | ORAL_TABLET | Freq: Two times a day (BID) | ORAL | 3 refills | Status: AC

## 2022-02-28 NOTE — Addendum Note (Signed)
Addended by: Blair Heys L on: 02/28/2022 11:33 AM ? ? Modules accepted: Orders ? ?

## 2022-02-28 NOTE — Addendum Note (Signed)
Addended by: Lindley Magnus L on: 02/28/2022 11:35 AM ? ? Modules accepted: Orders ? ?

## 2022-02-28 NOTE — Patient Instructions (Signed)
You were seen today in the [facility] for wellness visit. Labs were collected, results will be available via MyChart or, if abnormal, you will be contacted by clinic staff. You were prescribed medications, please take as directed. Please follow up in 1 mths for reevaluation of weight.  ?

## 2022-02-28 NOTE — Progress Notes (Signed)
? ?[place] ?[place], [place]  [address] ?Phone:  [phone]   Fax:  [phone] ?Subjective:  ? Patient ID: Angela Henderson, female    DOB: 03-15-1991, 31 y.o.   MRN: [id number] ? ?Chief Complaint  ?Patient presents with  ? Annual Exam  ?  Patient is here today for her annual physical with no concerns or issues to discuss.  ? ?HPI ?Angela Henderson 31 y.o. female  has a past medical history of DKA, type 2 ([place]) (1st admission 11/02/2015), Essential hypertension (02/05/2019), Hyperlipidemia, Insomnia (03/2020), and Type II diabetes mellitus ([place]) (dx'd 09/2015). To the [facility] for wellness visit. ? ?Patient states that she is wanting to lose weight, requesting medication to curb appetite. ? ?Currently does not exercise regularly or adhere to any diet regimen. 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  ? ?Denies any other concerns today. Denies any fatigue, chest pain, shortness of breath, HA or dizziness. Denies any blurred vision, numbness or tingling. ? ?Past Medical History:  ?Diagnosis Date  ? DKA, type 2 ([place]) 1st admission 11/02/2015  ? Essential hypertension 02/05/2019  ? Hyperlipidemia   ? Insomnia 03/2020  ? Type II diabetes mellitus ([place]) dx'd 09/2015  ? mother and grandmother are type 1, dad type 2  ? ? ?Past Surgical History:  ?Procedure Laterality Date  ? WISDOM TOOTH EXTRACTION    ? ? ?Family History  ?Problem Relation Age of Onset  ? Diabetes Mother   ?     Type I  ? Hypertension Mother   ? Diabetes Father   ? Hypertension Father   ? Diabetes Cousin   ?     Type I  ? Cancer Maternal Aunt   ? ? ?Social History  ? ?Socioeconomic History  ? Marital status: Single  ?  Spouse name: Not on file  ? Number of children: Not on file  ? Years of education: Not on file  ? Highest education level: Not on file  ?Occupational History  ? Not on file  ?Tobacco Use  ? Smoking status: Never  ? Smokeless tobacco: Never  ?Vaping Use  ? Vaping Use: Never used  ?Substance and Sexual  Activity  ? Alcohol use: Yes  ?  Alcohol/week: 0.0 standard drinks  ? Drug use: No  ? Sexual activity: Yes  ?Other Topics Concern  ? Not on file  ?Social History Narrative  ? Not on file  ? ?Social Determinants of Health  ? ?Financial Resource Strain: Not on file  ?Food Insecurity: Not on file  ?Transportation Needs: Not on file  ?Physical Activity: Not on file  ?Stress: Not on file  ?Social Connections: Not on file  ?Intimate Partner Violence: Not on file  ? ? ?Outpatient Medications Prior to Visit  ?Medication Sig Dispense Refill  ? blood glucose meter kit and supplies Dispense based on patient and insurance preference. Use up to four times daily as directed. (FOR ICD-10 E10.9, E11.9). 1 each 0  ? Continuous Blood Gluc Receiver (FREESTYLE LIBRE READER) DEVI 1 Device by Does not apply route 3 (three) times daily between meals as needed. 1 Device 0  ? Continuous Blood Gluc Sensor (FREESTYLE LIBRE 14 DAY SENSOR) MISC 1 Units/m2/day by Does not apply route 3 (three) times daily as needed. 2 each 11  ? cyclobenzaprine (FLEXERIL) 10 MG tablet Take 1 tablet (10 mg total) by mouth 3 (three) times daily as needed for muscle spasms. 60 tablet 3  ? glucose blood  test strip Use as instructed 100 each 12  ? ibuprofen (ADVIL) 600 MG tablet Take 1 tablet (600 mg total) by mouth every 6 (six) hours as needed. 30 tablet 0  ? OZEMPIC, 0.25 OR 0.5 MG/DOSE, 2 MG/1.5ML SOPN Inject 0.5 mg into the skin once a week. Tuesday's    ? albuterol (VENTOLIN HFA) 108 (90 Base) MCG/ACT inhaler Inhale 1-2 puffs into the lungs every 6 (six) hours as needed for wheezing or shortness of breath. (Patient not taking: Reported on 02/28/2022) 1 each 0  ? DM-APAP-CPM (CORICIDIN HBP) 10-325-2 MG TABS Take 1 tablet by mouth 2 (two) times daily as needed (cold symptoms). (Patient not taking: Reported on 02/28/2022) 30 tablet 0  ? ?No facility-administered medications prior to visit.  ? ? ?No Known Allergies ? ?Review of Systems  ?Constitutional: Negative.   Negative for chills, fever and malaise/fatigue.  ?HENT: Negative.    ?Eyes: Negative.   ?Respiratory:  Negative for cough and shortness of breath.   ?Cardiovascular:  Negative for chest pain, palpitations and leg swelling.  ?Gastrointestinal:  Negative for abdominal pain, blood in stool, constipation, diarrhea, nausea and vomiting.  ?Genitourinary: Negative.   ?Musculoskeletal: Negative.   ?Skin: Negative.   ?Neurological: Negative.   ?Psychiatric/Behavioral:  Negative for depression. The patient is not nervous/anxious.   ?All other systems reviewed and are negative. ? ?   ?Objective:  ?  ?Physical Exam ?Vitals reviewed.  ?Constitutional:   ?   General: She is not in acute distress. ?   Appearance: Normal appearance. She is obese.  ?HENT:  ?   Head: Normocephalic.  ?   Right Ear: Tympanic membrane, ear canal and external ear normal. There is no impacted cerumen.  ?   Left Ear: Tympanic membrane, ear canal and external ear normal. There is no impacted cerumen.  ?   Nose: Nose normal. No congestion or rhinorrhea.  ?   Mouth/Throat:  ?   Mouth: Mucous membranes are moist.  ?   Pharynx: Oropharynx is clear. No oropharyngeal exudate or posterior oropharyngeal erythema.  ?Eyes:  ?   General: No scleral icterus.    ?   Right eye: No discharge.     ?   Left eye: No discharge.  ?   Extraocular Movements: Extraocular movements intact.  ?   Conjunctiva/sclera: Conjunctivae normal.  ?   Pupils: Pupils are equal, round, and reactive to light.  ?Neck:  ?   Vascular: No carotid bruit.  ?Cardiovascular:  ?   Rate and Rhythm: Normal rate and regular rhythm.  ?   Pulses: Normal pulses.  ?   Heart sounds: Normal heart sounds.  ?   Comments: No obvious peripheral edema ?Pulmonary:  ?   Effort: Pulmonary effort is normal.  ?   Breath sounds: Normal breath sounds.  ?Abdominal:  ?   General: Abdomen is flat. Bowel sounds are normal. There is no distension.  ?   Palpations: Abdomen is soft. There is no mass.  ?   Tenderness: There is no  abdominal tenderness. There is no right CVA tenderness, left CVA tenderness, guarding or rebound.  ?   Hernia: No hernia is present.  ?Musculoskeletal:     ?   General: No swelling, tenderness, deformity or signs of injury. Normal range of motion.  ?   Cervical back: Normal range of motion and neck supple. No rigidity or tenderness.  ?   Right lower leg: No edema.  ?   Left lower leg: No edema.  ?  Lymphadenopathy:  ?   Cervical: No cervical adenopathy.  ?Skin: ?   General: Skin is warm and dry.  ?   Capillary Refill: Capillary refill takes less than 2 seconds.  ?Neurological:  ?   General: No focal deficit present.  ?   Mental Status: She is alert and oriented to person, place, and time.  ?Psychiatric:     ?   Mood and Affect: Mood normal.     ?   Behavior: Behavior normal.     ?   Thought Content: Thought content normal.     ?   Judgment: Judgment normal.  ? ? ?BP (!) 149/92   Pulse 66   Temp 99.1 ?F (37.3 ?C)   Ht _0  (1.676 m)   Wt 199 lb 3.2 oz (90.4 kg)   SpO2 99%   BMI 32.15 kg/m?  ?Wt Readings from Last 3 Encounters:  ?02/28/22 199 lb 3.2 oz (90.4 kg)  ?10/18/21 195 lb (88.5 kg)  ?08/08/21 190 lb (86.2 kg)  ? ? ?Immunization History  ?Administered Date(s) Administered  ? Influenza Whole 11/01/2007, 08/24/2008  ? Influenza-Unspecified 09/18/2015  ? Pneumococcal Polysaccharide-23 11/03/2015  ? Td 07/03/2010  ? Tdap 02/20/2017  ? ? ?Diabetic Foot Exam - Simple   ?No data filed ?  ? ? ?Lab Results  ?Component Value Date  ? TSH 2.31 12/16/2016  ? ?Lab Results  ?Component Value Date  ? WBC 6.8 10/18/2021  ? HGB 9.9 (L) 10/18/2021  ? HCT 33.3 (L) 10/18/2021  ? MCV 82.6 10/18/2021  ? PLT 423 (H) 10/18/2021  ? ?Lab Results  ?Component Value Date  ? NA 136 10/18/2021  ? K 3.8 10/18/2021  ? CO2 26 10/18/2021  ? GLUCOSE 103 (H) 10/18/2021  ? BUN 11 10/18/2021  ? CREATININE 0.76 10/18/2021  ? BILITOT 0.9 08/08/2021  ? ALKPHOS 47 08/08/2021  ? AST 25 08/08/2021  ? ALT 31 08/08/2021  ? PROT 8.5 (H) 08/08/2021  ?  ALBUMIN 4.3 08/08/2021  ? CALCIUM 8.8 (L) 10/18/2021  ? ANIONGAP 9 10/18/2021  ? GFR 128.62 10/31/2016  ? ?Lab Results  ?Component Value Date  ? CHOL 131 12/16/2016  ? CHOL 153 07/03/2010  ? ?Lab Results  ?Component Value

## 2022-03-01 LAB — COMPREHENSIVE METABOLIC PANEL
ALT: 34 IU/L — ABNORMAL HIGH (ref 0–32)
AST: 24 IU/L (ref 0–40)
Albumin/Globulin Ratio: 1.3 (ref 1.2–2.2)
Albumin: 4.7 g/dL (ref 3.8–4.8)
Alkaline Phosphatase: 49 IU/L (ref 44–121)
BUN/Creatinine Ratio: 10 (ref 9–23)
BUN: 9 mg/dL (ref 6–20)
Bilirubin Total: 0.2 mg/dL (ref 0.0–1.2)
CO2: 23 mmol/L (ref 20–29)
Calcium: 9.7 mg/dL (ref 8.7–10.2)
Chloride: 103 mmol/L (ref 96–106)
Creatinine, Ser: 0.88 mg/dL (ref 0.57–1.00)
Globulin, Total: 3.6 g/dL (ref 1.5–4.5)
Glucose: 241 mg/dL — ABNORMAL HIGH (ref 70–99)
Potassium: 5 mmol/L (ref 3.5–5.2)
Sodium: 137 mmol/L (ref 134–144)
Total Protein: 8.3 g/dL (ref 6.0–8.5)
eGFR: 89 mL/min/{1.73_m2} (ref >59)

## 2022-03-01 LAB — CBC WITH DIFFERENTIAL/PLATELET
Basophils Absolute: 0 10*3/uL (ref 0.0–0.2)
Basos: 0 %
EOS (ABSOLUTE): 0.1 10*3/uL (ref 0.0–0.4)
Eos: 2 %
Hematocrit: 35 % (ref 34.0–46.6)
Hemoglobin: 10.4 g/dL — ABNORMAL LOW (ref 11.1–15.9)
Immature Grans (Abs): 0 10*3/uL (ref 0.0–0.1)
Immature Granulocytes: 0 %
Lymphocytes Absolute: 1.8 10*3/uL (ref 0.7–3.1)
Lymphs: 36 %
MCH: 23.1 pg — ABNORMAL LOW (ref 26.6–33.0)
MCHC: 29.7 g/dL — ABNORMAL LOW (ref 31.5–35.7)
MCV: 78 fL — ABNORMAL LOW (ref 79–97)
Monocytes Absolute: 0.4 10*3/uL (ref 0.1–0.9)
Monocytes: 9 %
Neutrophils Absolute: 2.5 10*3/uL (ref 1.4–7.0)
Neutrophils: 53 %
Platelets: 279 10*3/uL (ref 150–450)
RBC: 4.5 x10E6/uL (ref 3.77–5.28)
RDW: 14.7 % (ref 11.7–15.4)
WBC: 4.8 10*3/uL (ref 3.4–10.8)

## 2022-03-01 LAB — LIPID PANEL
Chol/HDL Ratio: 5.1 ratio — ABNORMAL HIGH (ref 0.0–4.4)
Cholesterol, Total: 194 mg/dL (ref 100–199)
HDL: 38 mg/dL — ABNORMAL LOW (ref >39)
LDL Chol Calc (NIH): 130 mg/dL — ABNORMAL HIGH (ref 0–99)
Triglycerides: 147 mg/dL (ref 0–149)
VLDL Cholesterol Cal: 26 mg/dL (ref 5–40)

## 2022-03-02 LAB — URINE CULTURE

## 2022-03-03 ENCOUNTER — Other Ambulatory Visit: Payer: Self-pay | Admitting: Nurse Practitioner

## 2022-03-03 DIAGNOSIS — E7849 Other hyperlipidemia: Secondary | ICD-10-CM

## 2022-03-03 MED ORDER — ATORVASTATIN CALCIUM 40 MG PO TABS: 40.0000 mg | ORAL_TABLET | Freq: Every day | ORAL | 3 refills | Status: AC

## 2022-03-06 ENCOUNTER — Telehealth: Payer: Self-pay

## 2022-03-24 ENCOUNTER — Telehealth: Payer: Self-pay | Admitting: Nurse Practitioner

## 2022-03-24 ENCOUNTER — Telehealth: Payer: Self-pay

## 2022-03-24 NOTE — Telephone Encounter (Signed)
No additional notes needed  

## 2022-03-24 NOTE — Telephone Encounter (Signed)
I spoke to patient advise per provider that side effect is expected and symptoms should subside in 1-2 week's. If symptoms get worst she can call the office.  ?

## 2022-03-24 NOTE — Telephone Encounter (Signed)
Patient left VM on nurses line stating the Metformin she's taking makes her sick. Would like another medication prescribed. ?[phone]. ?

## 2022-03-24 NOTE — Telephone Encounter (Signed)
I spoke to patient advise per provider that side effect is expected and symptoms should subside in 1-2 week's. If symptoms get worst sh can call the office. ?

## 2022-04-02 ENCOUNTER — Ambulatory Visit: Payer: BC Managed Care – PPO | Admitting: Nurse Practitioner

## 2022-06-13 ENCOUNTER — Telehealth: Payer: Self-pay | Admitting: Nurse Practitioner

## 2022-06-13 NOTE — Telephone Encounter (Signed)
Pt called she just found out she was pregnant and would like her provider or nurse to call her to [facility] sure all her medications are okay to take while pregnant.

## 2022-06-15 DIAGNOSIS — O24111 Pre-existing diabetes mellitus, type 2, in pregnancy, first trimester: Secondary | ICD-10-CM | POA: Diagnosis not present

## 2022-06-15 DIAGNOSIS — E1165 Type 2 diabetes mellitus with hyperglycemia: Secondary | ICD-10-CM | POA: Diagnosis not present

## 2022-06-15 DIAGNOSIS — R739 Hyperglycemia, unspecified: Secondary | ICD-10-CM | POA: Diagnosis not present

## 2022-06-15 DIAGNOSIS — O99810 Abnormal glucose complicating pregnancy: Secondary | ICD-10-CM | POA: Diagnosis not present

## 2022-06-15 DIAGNOSIS — Z3201 Encounter for pregnancy test, result positive: Secondary | ICD-10-CM | POA: Diagnosis not present

## 2022-06-15 DIAGNOSIS — Z3A01 Less than 8 weeks gestation of pregnancy: Secondary | ICD-10-CM | POA: Diagnosis not present

## 2022-06-15 DIAGNOSIS — Z833 Family history of diabetes mellitus: Secondary | ICD-10-CM | POA: Diagnosis not present

## 2022-06-15 DIAGNOSIS — R112 Nausea with vomiting, unspecified: Secondary | ICD-10-CM | POA: Diagnosis not present

## 2022-06-15 DIAGNOSIS — Z91199 Patient's noncompliance with other medical treatment and regimen due to unspecified reason: Secondary | ICD-10-CM | POA: Diagnosis not present

## 2022-06-15 DIAGNOSIS — Z3A08 8 weeks gestation of pregnancy: Secondary | ICD-10-CM | POA: Diagnosis not present

## 2022-06-15 DIAGNOSIS — Z7984 Long term (current) use of oral hypoglycemic drugs: Secondary | ICD-10-CM | POA: Diagnosis not present

## 2022-06-18 DIAGNOSIS — O24111 Pre-existing diabetes mellitus, type 2, in pregnancy, first trimester: Secondary | ICD-10-CM | POA: Diagnosis not present

## 2022-06-18 DIAGNOSIS — Z3A01 Less than 8 weeks gestation of pregnancy: Secondary | ICD-10-CM | POA: Diagnosis not present

## 2022-06-18 NOTE — Telephone Encounter (Signed)
Patient will need to discuss this with OB/GYN - do I need to place a referral for her?
